# Patient Record
Sex: Female | Born: 2000
Health system: Southern US, Community
[De-identification: ages and names within clinical notes are randomized; demographics above are authoritative.]

## PROBLEM LIST (undated history)

## (undated) DIAGNOSIS — T7840XA Allergy, unspecified, initial encounter: Secondary | ICD-10-CM

## (undated) DIAGNOSIS — L309 Dermatitis, unspecified: Secondary | ICD-10-CM

## (undated) DIAGNOSIS — F32A Depression, unspecified: Secondary | ICD-10-CM

## (undated) DIAGNOSIS — F909 Attention-deficit hyperactivity disorder, unspecified type: Secondary | ICD-10-CM

## (undated) DIAGNOSIS — F419 Anxiety disorder, unspecified: Secondary | ICD-10-CM

## (undated) DIAGNOSIS — I7300 Raynaud's syndrome without gangrene: Secondary | ICD-10-CM

## (undated) DIAGNOSIS — R251 Tremor, unspecified: Secondary | ICD-10-CM

## (undated) DIAGNOSIS — F329 Major depressive disorder, single episode, unspecified: Secondary | ICD-10-CM

## (undated) HISTORY — PX: WISDOM TOOTH EXTRACTION: SHX21

## (undated) HISTORY — PX: NO PAST SURGERIES: SHX2092

## (undated) HISTORY — DX: Tremor, unspecified: R25.1

## (undated) HISTORY — DX: Allergy, unspecified, initial encounter: T78.40XA

## (undated) HISTORY — DX: Raynaud's syndrome without gangrene: I73.00

## (undated) HISTORY — DX: Dermatitis, unspecified: L30.9

## (undated) HISTORY — DX: Attention-deficit hyperactivity disorder, unspecified type: F90.9

---
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
## 2019-01-01 DIAGNOSIS — F411 Generalized anxiety disorder: Secondary | ICD-10-CM | POA: Diagnosis not present

## 2019-01-01 DIAGNOSIS — F902 Attention-deficit hyperactivity disorder, combined type: Secondary | ICD-10-CM | POA: Diagnosis not present

## 2019-01-01 DIAGNOSIS — F4312 Post-traumatic stress disorder, chronic: Secondary | ICD-10-CM | POA: Diagnosis not present

## 2019-01-01 DIAGNOSIS — F331 Major depressive disorder, recurrent, moderate: Secondary | ICD-10-CM | POA: Diagnosis not present

## 2019-01-08 DIAGNOSIS — F4312 Post-traumatic stress disorder, chronic: Secondary | ICD-10-CM | POA: Diagnosis not present

## 2019-01-08 DIAGNOSIS — F411 Generalized anxiety disorder: Secondary | ICD-10-CM | POA: Diagnosis not present

## 2019-01-08 DIAGNOSIS — F902 Attention-deficit hyperactivity disorder, combined type: Secondary | ICD-10-CM | POA: Diagnosis not present

## 2019-01-08 DIAGNOSIS — F331 Major depressive disorder, recurrent, moderate: Secondary | ICD-10-CM | POA: Diagnosis not present

## 2019-01-15 DIAGNOSIS — F411 Generalized anxiety disorder: Secondary | ICD-10-CM | POA: Diagnosis not present

## 2019-01-15 DIAGNOSIS — F902 Attention-deficit hyperactivity disorder, combined type: Secondary | ICD-10-CM | POA: Diagnosis not present

## 2019-01-15 DIAGNOSIS — F331 Major depressive disorder, recurrent, moderate: Secondary | ICD-10-CM | POA: Diagnosis not present

## 2019-01-15 DIAGNOSIS — F4312 Post-traumatic stress disorder, chronic: Secondary | ICD-10-CM | POA: Diagnosis not present

## 2019-01-22 DIAGNOSIS — F411 Generalized anxiety disorder: Secondary | ICD-10-CM | POA: Diagnosis not present

## 2019-01-22 DIAGNOSIS — F902 Attention-deficit hyperactivity disorder, combined type: Secondary | ICD-10-CM | POA: Diagnosis not present

## 2019-01-22 DIAGNOSIS — F4312 Post-traumatic stress disorder, chronic: Secondary | ICD-10-CM | POA: Diagnosis not present

## 2019-01-22 DIAGNOSIS — F331 Major depressive disorder, recurrent, moderate: Secondary | ICD-10-CM | POA: Diagnosis not present

## 2019-01-28 DIAGNOSIS — F902 Attention-deficit hyperactivity disorder, combined type: Secondary | ICD-10-CM | POA: Diagnosis not present

## 2019-01-28 DIAGNOSIS — F411 Generalized anxiety disorder: Secondary | ICD-10-CM | POA: Diagnosis not present

## 2019-01-28 DIAGNOSIS — F331 Major depressive disorder, recurrent, moderate: Secondary | ICD-10-CM | POA: Diagnosis not present

## 2019-01-28 DIAGNOSIS — F4312 Post-traumatic stress disorder, chronic: Secondary | ICD-10-CM | POA: Diagnosis not present

## 2019-02-07 DIAGNOSIS — F411 Generalized anxiety disorder: Secondary | ICD-10-CM | POA: Diagnosis not present

## 2019-02-07 DIAGNOSIS — F902 Attention-deficit hyperactivity disorder, combined type: Secondary | ICD-10-CM | POA: Diagnosis not present

## 2019-02-07 DIAGNOSIS — F4312 Post-traumatic stress disorder, chronic: Secondary | ICD-10-CM | POA: Diagnosis not present

## 2019-02-07 DIAGNOSIS — F331 Major depressive disorder, recurrent, moderate: Secondary | ICD-10-CM | POA: Diagnosis not present

## 2019-02-12 DIAGNOSIS — F331 Major depressive disorder, recurrent, moderate: Secondary | ICD-10-CM | POA: Diagnosis not present

## 2019-02-12 DIAGNOSIS — F902 Attention-deficit hyperactivity disorder, combined type: Secondary | ICD-10-CM | POA: Diagnosis not present

## 2019-02-12 DIAGNOSIS — F4312 Post-traumatic stress disorder, chronic: Secondary | ICD-10-CM | POA: Diagnosis not present

## 2019-02-12 DIAGNOSIS — F411 Generalized anxiety disorder: Secondary | ICD-10-CM | POA: Diagnosis not present

## 2019-02-19 DIAGNOSIS — F411 Generalized anxiety disorder: Secondary | ICD-10-CM | POA: Diagnosis not present

## 2019-02-19 DIAGNOSIS — F331 Major depressive disorder, recurrent, moderate: Secondary | ICD-10-CM | POA: Diagnosis not present

## 2019-02-19 DIAGNOSIS — F902 Attention-deficit hyperactivity disorder, combined type: Secondary | ICD-10-CM | POA: Diagnosis not present

## 2019-02-19 DIAGNOSIS — F4312 Post-traumatic stress disorder, chronic: Secondary | ICD-10-CM | POA: Diagnosis not present

## 2019-02-23 NOTE — Progress Notes (Signed)
Subjective:    Patient ID: Cassandra Schultz, female    DOB: 17-Jan-2001, 18 y.o.   MRN: [id number]  HPI:   Cassandra Schultz presents with chronic back pain, bil hip pain, bil knee pain, and bil ankle pain that started >3 years ago. She reports course of formal PT junior yr in HS She denies recent acute injury/trauma prior to onset of various pain sources. She is unable to rate pain, describes pain as "constant dull ache" that will worsen with repetitive movements. 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. She is FT Journalist, newspaper, living with friends parents as her relationship with her parents is toxic. She reports stable mood, denies SI/HI She denies tobacco/vape/ETOH/illicit drug use She reports that her mother has similar pain issues and is undergoing testing for RA  Patient Care Team    Relationship Specialty Notifications Start End  William Hamburger D, NP PCP - General Family Medicine  12/23/18   Maeola Harman, MD  Pediatrics  04/03/18    Comment: Rhea Pink, MD Consulting Physician Psychiatry  12/24/18     Patient Active Problem List   Diagnosis Date Noted  . Low back pain 02/24/2019  . Bilateral knee pain 02/24/2019  . Bilateral hip pain 02/24/2019  . Acute bilateral ankle pain 02/24/2019  . Healthcare maintenance 12/23/2018  . Generalized anxiety disorder 04/03/2018  . Attention deficit hyperactivity disorder (ADHD), combined type, severe 04/03/2018  . Mild recurrent major depression (HCC) 04/03/2018  . Developmental coordination disorder 04/03/2018  . Allergy with anaphylaxis due to food 02/02/2016  . Allergic rhinitis due to pollen 02/02/2016     Past Medical History:  Diagnosis Date  . ADHD   . Allergy   . Anxiety   . Depression    reports ADHD      Past Surgical History:  Procedure Laterality Date  . NO PAST SURGERIES       Family History  Problem Relation  Age of Onset  . Allergic rhinitis Mother   . Depression Mother   . Arthritis Mother   . Allergic rhinitis Father   . Allergic rhinitis Paternal Aunt   . Alcohol abuse Maternal Grandmother   . Depression Maternal Grandmother   . Alcohol abuse Maternal Grandfather   . Angioedema Neg Hx   . Asthma Neg Hx   . Eczema Neg Hx   . Immunodeficiency Neg Hx   . Urticaria Neg Hx      Social History   Substance and Sexual Activity  Drug Use Never     Social History   Substance and Sexual Activity  Alcohol Use Never  . Frequency: Never     Social History   Tobacco Use  Smoking Status Never Smoker  Smokeless Tobacco Never Used     Outpatient Encounter Medications as of 02/24/2019  Medication Sig  . ALPRAZolam (XANAX) 0.25 MG tablet Take 0.25 mg by mouth 2 (two) times daily as needed.  Marland Kitchen amphetamine-dextroamphetamine (ADDERALL) 30 MG tablet Take 1 tablet by mouth 2 (two) times daily with breakfast and lunch.  . EPINEPHrine 0.3 mg/0.3 mL IJ SOAJ injection INJECT 0.3 MLS (0.3 MG TOTAL) INTO THE MUSCLE ONCE.  Marland Kitchen FERROUS SULFATE PO Take 27 mg by mouth daily.  Marland Kitchen FLUoxetine (PROZAC) 20 MG capsule Take 1 capsule by mouth daily.  Marland Kitchen LARISSIA 0.1-20 MG-MCG tablet Take 1 tablet by mouth daily.  Marland Kitchen  Levocetirizine Dihydrochloride (XYZAL PO) Take 10 mg by mouth daily.  . [DISCONTINUED] amphetamine-dextroamphetamine (ADDERALL) 30 MG tablet Take 1 tablet by mouth 2 (two) times daily with breakfast and lunch.  . [DISCONTINUED] busPIRone (BUSPAR) 15 MG tablet Take 15 mg by mouth 2 (two) times daily.  . [DISCONTINUED] DULoxetine (CYMBALTA) 30 MG capsule Take 1 capsule (30 mg total) by mouth daily.   No facility-administered encounter medications on file as of 02/24/2019.     Allergies: Other, Peanut-containing drug products, Acyclovir and related, and Augmentin [amoxicillin-pot clavulanate]  Body mass index is 18.13 kg/m.  Blood pressure 96/63, pulse (!) 101, temperature 99.6 F (37.6 C),  temperature source Oral, height 5' 8.25" (1.734 m), weight 120 lb 1.6 oz (54.5 kg), last menstrual period 02/03/2019, SpO2 96 %.   Review of Systems  Constitutional: Positive for activity change and fatigue. Negative for appetite change, chills, diaphoresis, fever and unexpected weight change.  Eyes: Negative for visual disturbance.  Respiratory: Negative for cough, chest tightness, shortness of breath, wheezing and stridor.   Cardiovascular: Negative for chest pain, palpitations and leg swelling.  Endocrine: Negative for cold intolerance, heat intolerance, polydipsia, polyphagia and polyuria.  Musculoskeletal: Positive for arthralgias, back pain, gait problem, joint swelling, myalgias, neck pain and neck stiffness.  Neurological: Negative for dizziness and headaches.  Hematological: Negative for adenopathy. Does not bruise/bleed easily.  Psychiatric/Behavioral: Positive for decreased concentration, dysphoric mood and sleep disturbance. Negative for agitation, behavioral problems, confusion, hallucinations, self-injury and suicidal ideas. The patient is nervous/anxious and is hyperactive.        Objective:   Physical Exam Vitals signs and nursing note reviewed.  HENT:     Head: Normocephalic and atraumatic.  Cardiovascular:     Rate and Rhythm: Tachycardia present.     Pulses: Normal pulses.     Heart sounds: Normal heart sounds. No murmur. No friction rub. No gallop.   Pulmonary:     Effort: Pulmonary effort is normal. No respiratory distress.     Breath sounds: Normal breath sounds. No stridor. No wheezing, rhonchi or rales.  Chest:     Chest wall: No tenderness.  Musculoskeletal:        General: No tenderness.     Right hip: She exhibits normal range of motion, normal strength and no tenderness.     Left hip: She exhibits normal range of motion and normal strength.     Right knee: She exhibits normal range of motion and no swelling. No tenderness found.     Left knee: She  exhibits normal range of motion and no swelling. No tenderness found.     Right ankle: She exhibits normal range of motion. No tenderness.     Left ankle: She exhibits normal range of motion. No tenderness.     Cervical back: She exhibits no tenderness.     Thoracic back: She exhibits normal range of motion, no tenderness and no spasm.     Lumbar back: She exhibits normal range of motion, no tenderness and no spasm.     Comments: No tenderness with palpation, however pt appeared uncomfortable and constantly stretched during OV   Skin:    General: Skin is warm and dry.     Capillary Refill: Capillary refill takes less than 2 seconds.  Neurological:     Mental Status: She is alert and oriented to person, place, and time.     Coordination: Coordination normal.  Psychiatric:        Attention and Perception: Attention normal.  Mood and Affect: Mood is anxious.        Speech: Speech is rapid and pressured.        Behavior: Behavior normal.        Thought Content: Thought content normal.        Cognition and Memory: Cognition and memory normal.        Judgment: Judgment normal.           Assessment & Plan:   1. Need for influenza vaccination   2. Arthralgia, unspecified joint   3. Chronic bilateral low back pain without sciatica   4. Hip pain, bilateral   5. Bilateral chronic knee pain   6. Chronic pain of both ankles   7. Healthcare maintenance   8. Chronic pain of both knees   9. Bilateral hip pain   10. Acute bilateral ankle pain     Healthcare maintenance 1) Physical Therapy Referral placed, stretch daily as tolerated.Marland Kitchen 2) We will call you when lab results are available. 3) Recommend daily Boost Shakes to help increase daily caloric intake. 4) Continue with close follow-up with mental health care team.  Discuss Fibromyalgia with Psychiatrist. 5) Remain well hydrated with water. 6) Continue to abstain from tobacco/vape/alcohol use. 7) Follow-up here 3 months, sooner  if needed. 8) Continue social distance and to wear a mask in public.  Low back pain No tenderness with palpation, however pt appeared uncomfortable and constantly stretched during OV  Referral to PT placed Encouraged to stretch daily as tolerated Rheumatoid Factor drawn today   Bilateral knee pain No tenderness with palpation, however pt appeared uncomfortable and constantly stretched during OV  Referral to PT placed Encouraged to stretch daily as tolerated Rheumatoid Factor drawn today   Bilateral hip pain No tenderness with palpation, however pt appeared uncomfortable and constantly stretched during OV  Referral to PT placed Encouraged to stretch daily as tolerated Rheumatoid Factor drawn today   Acute bilateral ankle pain No tenderness with palpation, however pt appeared uncomfortable and constantly stretched during OV  Referral to PT placed Encouraged to stretch daily as tolerated Rheumatoid Factor drawn today     FOLLOW-UP:  Return in about 3 months (around 05/26/2019) for Regular Follow Up.

## 2019-02-24 ENCOUNTER — Other Ambulatory Visit: Payer: Self-pay

## 2019-02-24 ENCOUNTER — Ambulatory Visit (INDEPENDENT_AMBULATORY_CARE_PROVIDER_SITE_OTHER): Payer: BC Managed Care – PPO | Admitting: Adult Health

## 2019-02-24 ENCOUNTER — Encounter: Payer: Self-pay | Admitting: Adult Health

## 2019-02-24 VITALS — BP 96/63 | HR 101 | Temp 99.6°F | Ht 68.25 in | Wt 120.1 lb

## 2019-02-24 DIAGNOSIS — M545 Low back pain, unspecified: Secondary | ICD-10-CM | POA: Insufficient documentation

## 2019-02-24 DIAGNOSIS — Z23 Encounter for immunization: Secondary | ICD-10-CM

## 2019-02-24 DIAGNOSIS — M25562 Pain in left knee: Secondary | ICD-10-CM

## 2019-02-24 DIAGNOSIS — M25551 Pain in right hip: Secondary | ICD-10-CM

## 2019-02-24 DIAGNOSIS — M25561 Pain in right knee: Secondary | ICD-10-CM

## 2019-02-24 DIAGNOSIS — M25572 Pain in left ankle and joints of left foot: Secondary | ICD-10-CM

## 2019-02-24 DIAGNOSIS — M2550 Pain in unspecified joint: Secondary | ICD-10-CM | POA: Diagnosis not present

## 2019-02-24 DIAGNOSIS — G8929 Other chronic pain: Secondary | ICD-10-CM

## 2019-02-24 DIAGNOSIS — M25552 Pain in left hip: Secondary | ICD-10-CM

## 2019-02-24 DIAGNOSIS — Z0000 Encounter for general adult medical examination without abnormal findings: Secondary | ICD-10-CM

## 2019-02-24 DIAGNOSIS — M25571 Pain in right ankle and joints of right foot: Secondary | ICD-10-CM | POA: Insufficient documentation

## 2019-02-24 NOTE — Assessment & Plan Note (Addendum)
No tenderness with palpation, however pt appeared uncomfortable and constantly stretched during OV  Referral to PT placed Encouraged to stretch daily as tolerated Rheumatoid Factor drawn today  

## 2019-02-24 NOTE — Assessment & Plan Note (Signed)
No tenderness with palpation, however pt appeared uncomfortable and constantly stretched during OV  Referral to PT placed Encouraged to stretch daily as tolerated Rheumatoid Factor drawn today

## 2019-02-24 NOTE — Patient Instructions (Addendum)
Acute Back Pain, Adult Acute back pain is sudden and usually short-lived. It is often caused by an injury to the muscles and tissues in the back. The injury may result from:  A muscle or ligament getting overstretched or torn (strained). Ligaments are tissues that connect bones to each other. Lifting something improperly can cause a back strain.  Wear and tear (degeneration) of the spinal disks. Spinal disks are circular tissue that provides cushioning between the bones of the spine (vertebrae).  Twisting motions, such as while playing sports or doing yard work.  A hit to the back.  Arthritis. You may have a physical exam, lab tests, and imaging tests to find the cause of your pain. Acute back pain usually goes away with rest and home care. Follow these instructions at home: Managing pain, stiffness, and swelling  Take over-the-counter and prescription medicines only as told by your health care provider.  Your health care provider may recommend applying ice during the first 24-48 hours after your pain starts. To do this: ? Put ice in a plastic bag. ? Place a towel between your skin and the bag. ? Leave the ice on for 20 minutes, 2-3 times a day.  If directed, apply heat to the affected area as often as told by your health care provider. Use the heat source that your health care provider recommends, such as a moist heat pack or a heating pad. ? Place a towel between your skin and the heat source. ? Leave the heat on for 20-30 minutes. ? Remove the heat if your skin turns bright red. This is especially important if you are unable to feel pain, heat, or cold. You have a greater risk of getting burned. Activity   Do not stay in bed. Staying in bed for more than 1-2 days can delay your recovery.  Sit up and stand up straight. Avoid leaning forward when you sit, or hunching over when you stand. ? If you work at a desk, sit close to it so you do not need to lean over. Keep your chin tucked  in. Keep your neck drawn back, and keep your elbows bent at a right angle. Your arms should look like the letter "L." ? Sit high and close to the steering wheel when you drive. Add lower back (lumbar) support to your car seat, if needed.  Take short walks on even surfaces as soon as you are able. Try to increase the length of time you walk each day.  Do not sit, drive, or stand in one place for more than 30 minutes at a time. Sitting or standing for long periods of time can put stress on your back.  Do not drive or use heavy machinery while taking prescription pain medicine.  Use proper lifting techniques. When you bend and lift, use positions that put less stress on your back: ? Bend your knees. ? Keep the load close to your body. ? Avoid twisting.  Exercise regularly as told by your health care provider. Exercising helps your back heal faster and helps prevent back injuries by keeping muscles strong and flexible.  Work with a physical therapist to make a safe exercise program, as recommended by your health care provider. Do any exercises as told by your physical therapist. Lifestyle  Maintain a healthy weight. Extra weight puts stress on your back and makes it difficult to have good posture.  Avoid activities or situations that make you feel anxious or stressed. Stress and anxiety increase muscle   tension and can make back pain worse. Learn ways to manage anxiety and stress, such as through exercise. General instructions  Sleep on a firm mattress in a comfortable position. Try lying on your side with your knees slightly bent. If you lie on your back, put a pillow under your knees.  Follow your treatment plan as told by your health care provider. This may include: ? Cognitive or behavioral therapy. ? Acupuncture or massage therapy. ? Meditation or yoga. Contact a health care provider if:  You have pain that is not relieved with rest or medicine.  You have increasing pain going down  into your legs or buttocks.  Your pain does not improve after 2 weeks.  You have pain at night.  You lose weight without trying.  You have a fever or chills. Get help right away if:  You develop new bowel or bladder control problems.  You have unusual weakness or numbness in your arms or legs.  You develop nausea or vomiting.  You develop abdominal pain.  You feel faint. Summary  Acute back pain is sudden and usually short-lived.  Use proper lifting techniques. When you bend and lift, use positions that put less stress on your back.  Take over-the-counter and prescription medicines and apply heat or ice as directed by your health care provider. This information is not intended to replace advice given to you by your health care provider. Make sure you discuss any questions you have with your health care provider. Document Released: 06/04/2005 Document Revised: 09/23/2018 Document Reviewed: 01/16/2017 Elsevier Patient Education  2020 [employer].  1) Physical Therapy Referral placed, stretch daily as tolerated.Marland Kitchen 2) We will call you when lab results are available. 3) Recommend daily Boost Shakes to help increase daily caloric intake. 4) Continue with close follow-up with mental health care team.  Discuss Fibromyalgia with Psychiatrist. 5) Remain well hydrated with water. 6) Continue to abstain from tobacco/vape/alcohol use. 7) Follow-up here 3 months, sooner if needed. 8) Continue social distance and to wear a mask in public. FEEL BETTER!

## 2019-02-24 NOTE — Assessment & Plan Note (Signed)
No tenderness with palpation, however pt appeared uncomfortable and constantly stretched during OV  Referral to PT placed Encouraged to stretch daily as tolerated Rheumatoid Factor drawn today  

## 2019-02-24 NOTE — Assessment & Plan Note (Signed)
1) Physical Therapy Referral placed, stretch daily as tolerated.Marland Kitchen 2) We will call you when lab results are available. 3) Recommend daily Boost Shakes to help increase daily caloric intake. 4) Continue with close follow-up with mental health care team.  Discuss Fibromyalgia with Psychiatrist. 5) Remain well hydrated with water. 6) Continue to abstain from tobacco/vape/alcohol use. 7) Follow-up here 3 months, sooner if needed. 8) Continue social distance and to wear a mask in public.

## 2019-02-25 ENCOUNTER — Telehealth: Payer: Self-pay | Admitting: Adult Health

## 2019-02-25 LAB — RHEUMATOID FACTOR: Rheumatoid fact SerPl-aCnc: <10 IU/mL (ref 0.0–13.9)

## 2019-02-25 NOTE — Telephone Encounter (Signed)
See results notes.  T. Treston Coker, CMA 

## 2019-02-25 NOTE — Telephone Encounter (Signed)
Patient returned Cassandra Schultz's message to call office.  --glh

## 2019-02-26 DIAGNOSIS — F4312 Post-traumatic stress disorder, chronic: Secondary | ICD-10-CM | POA: Diagnosis not present

## 2019-02-26 DIAGNOSIS — F902 Attention-deficit hyperactivity disorder, combined type: Secondary | ICD-10-CM | POA: Diagnosis not present

## 2019-02-26 DIAGNOSIS — F411 Generalized anxiety disorder: Secondary | ICD-10-CM | POA: Diagnosis not present

## 2019-02-26 DIAGNOSIS — F331 Major depressive disorder, recurrent, moderate: Secondary | ICD-10-CM | POA: Diagnosis not present

## 2019-03-02 ENCOUNTER — Encounter: Payer: BC Managed Care – PPO | Admitting: Adult Health

## 2019-03-03 ENCOUNTER — Ambulatory Visit: Payer: BC Managed Care – PPO | Attending: Adult Health | Admitting: Physical Therapy

## 2019-03-03 ENCOUNTER — Other Ambulatory Visit: Payer: Self-pay

## 2019-03-03 ENCOUNTER — Encounter: Payer: Self-pay | Admitting: Physical Therapy

## 2019-03-03 DIAGNOSIS — M6281 Muscle weakness (generalized): Secondary | ICD-10-CM | POA: Diagnosis not present

## 2019-03-03 DIAGNOSIS — G8929 Other chronic pain: Secondary | ICD-10-CM | POA: Diagnosis not present

## 2019-03-03 DIAGNOSIS — M545 Low back pain, unspecified: Secondary | ICD-10-CM

## 2019-03-03 DIAGNOSIS — M2550 Pain in unspecified joint: Secondary | ICD-10-CM | POA: Diagnosis not present

## 2019-03-04 NOTE — Therapy (Signed)
[place], [place], [address] Phone: [phone]   Fax:  [phone]  Physical Therapy Evaluation  Patient Details  Name: Cassandra Schultz MRN: [id number] Date of Birth: 2000/11/13 Referring Provider (PT): Mina Marble , NP    Encounter Date: 03/03/2019  PT End of Session - 03/04/19 5809    Visit Number  1    Number of Visits  12    Date for PT Re-Evaluation  04/28/19   allow 8 weeks for scheduling   PT Start Time  9833    PT Stop Time  1746    PT Time Calculation (min)  48 min    Activity Tolerance  Patient tolerated treatment well    Behavior During Therapy  [facility] for tasks assessed/performed;Restless       Past Medical History:  Diagnosis Date  . ADHD   . Allergy   . Anxiety   . Depression    reports ADHD     Past Surgical History:  Procedure Laterality Date  . NO PAST SURGERIES      There were no vitals filed for this visit.   Subjective Assessment - 03/03/19 1700    Subjective  Patient presents with back, knees, hips, and ankles.  The hip and ankle pain is new to her, she has had back pain in the past.  She has been working 2 jobs for months and that was challenging for her.  Now its only a part time job so she is hopeful that may help things. She has in the past been able to control her pain with exercise. She is having a hard time gaining weight. Enjoys exercise but now it seems to make her hurt more.    Pertinent History  anxiety, depression    Limitations  Standing;Walking;Lifting;Sitting    How long can you sit comfortably?  1 hour    How long can you stand comfortably?  needs to sit at 1-2 hours    How long can you walk comfortably?  can do an hour, not as bad as standing still    Diagnostic tests  Blood work, [facility] neg    Patient Stated Goals  Patient would like to learn exercise to protect my joints and muscles    Currently in Pain?  Yes    Pain Score  1     Pain Location  Back    Pain Orientation  Lower    Pain Descriptors / Indicators  Aching    Pain Type  Chronic pain    Pain Onset  More than a month ago    Aggravating Factors   standing, sitting long periods, incr as the day goes on    Pain Relieving Factors  rest, folding forward (spine flexion), stretching    Effect of Pain on Daily Activities  hard to work    Multiple Pain Sites  Yes    Pain Score  0    Pain Location  Generalized   hips, knees and ankles not hurting at the time        [facility] PT Assessment - 03/04/19 0001      Assessment   Medical Diagnosis  low back pain, hip, knee and ankle pain     Referring Provider (PT)  Mina Marble , NP     Onset Date/Surgical Date  --   chronic    Hand Dominance  Right    Prior Therapy  Yes       Precautions  Precautions  None      Restrictions   Weight Bearing Restrictions  No      Balance Screen   Has the patient fallen in the past 6 months  No   balance is "not great"     Home Environment   Living Environment  Private residence    Living Arrangements  Non-relatives/Friends    Type of Home  House    Home Layout  Two level    Additional Comments  currently living with her friend's mom       Prior Function   Level of Independence  Independent    Vocation  Part time employment;Student    Vocation Requirements  standing long periods, cases of produce intermittently     Leisure  used to do school, running, exercise, art , friends       Cognition   Overall Cognitive Status  Within Functional Limits for tasks assessed    Behaviors  Restless      Observation/Other Assessments   Focus on Therapeutic Outcomes (FOTO)   NT did not capture       Sensation   Light Touch  Appears Intact      Posture/Postural Control   Posture/Postural Control  Postural limitations    Postural Limitations  Rounded Shoulders;Forward head    Posture Comments  small frame, slumped sitting       AROM   Lumbar Flexion  WFL    Lumbar Extension  limited 10-15% min pain      Lumbar - Right Side Bend  WFL    Lumbar - Left Side Bend  WFL    Lumbar - Right Rotation  WFL     Lumbar - Left Rotation  WFL       PROM   Overall PROM Comments  hips, knees WFL      Strength   Overall Strength Comments  WFL in knees, ankles    core fair    Right Hip ABduction  4/5    Right Hip ADduction  3+/5    Left Hip ABduction  4-/5    Left Hip ADduction  3+/5    Lumbar Flexion  --   fair      Flexibility   Hamstrings  min tightness    ITB  min tightness       Palpation   Patella mobility  WNL     Palpation comment  tender with palpation to infrapatellar fat pad , none along joint lines, tender Gr. Trochanter bilateral. Min tender in bilateral Quadratus lumborum      Special Tests    Special Tests  Knee Special Tests    Lumbar Tests  Straight Leg Raise    Hip Special Tests   Trendelenberg Test    Knee Special tests   other      Straight Leg Raise   Findings  Negative      Trendelenburg Test   Findings  Negative      other    Findings  Negative    Comments  stability testing neg        Objective measurements completed on examination: See above findings.      PT Education - 03/04/19 [phone]    Education Details  HEP, PT/POC, joint pain, recommendations for exercise, footwear    Person(s) Educated  Patient    Methods  Explanation;Demonstration;Handout;Verbal cues    Comprehension  Verbalized understanding;Returned demonstration          PT Long Term Goals -  03/03/19 1735      PT LONG TERM GOAL #1   Title  She will be able to demo final HEP with good technique    Time  8    Period  Weeks    Status  New    Target Date  04/28/19      PT LONG TERM GOAL #2   Title  Pt will be able to stand 4-5 hours with pain relieved with rest in order to perform work duties    Time  8    Period  Weeks    Status  New    Target Date  04/28/19      PT LONG TERM GOAL #3   Title  Pt will understand posture, safe lifting as it pertains to back pain and joint  protection.    Time  8    Period  Weeks    Status  New    Target Date  04/28/19      PT LONG TERM GOAL #4   Title  Pt will be able to hold plank position for 30 sec with no pain in back, good form to demo increased core strength    Time  8    Period  Weeks    Status  New    Target Date  04/28/19      PT LONG TERM GOAL #5   Title  Pt wil be able to exercise at home 30 min x 3 days per week without increased pain following in joints (muscular soreness OK)    Time  8    Period  Weeks    Status  New    Target Date  04/28/19             Plan - 03/04/19 [id number]    Clinical Impression Statement  Patient presents for mod complexity eval of back and lower extremity joint pain.  The hip, knee and ankle pain is new to patient from previous PT episode , began about 6 mos ago. She has min weakness in core and hips, generally decreased muscular endurance, no signs of joint instability.  She hopes to be able to get back into an exercise routine. 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  She should do well with PT intervention as it did help her in the past (2017)    Personal Factors and Comorbidities  Comorbidity 1;Other;Social Background    Comorbidities  anxiety, depression    Examination-Activity Limitations  Sit;Stand    Examination-Participation Restrictions  Community Activity;Other   work   Conservation officer, historic buildingstability/Clinical Decision Making  Evolving/Moderate complexity    Clinical Decision Making  Moderate    Rehab Potential  Excellent    PT Frequency  2x / week    PT Duration  6 weeks   8 weeks for scheduling   PT Treatment/Interventions  ADLs/Self Care Home Management;Patient/family education;Moist Heat;Therapeutic activities;Ultrasound;Therapeutic exercise;Taping;Neuromuscular re-education;Electrical Stimulation;Cryotherapy;Manual techniques    PT Next Visit Plan  check HEP, bike, progress core and hip strength, look at ankle a bit more (ROM, strength) time limited on eval    PT  Home Exercise Plan  ITB stretch, knee to chest, hip abd and bridge    Consulted and Agree with Plan of Care  Patient       Patient will benefit from skilled therapeutic intervention in order to improve the following deficits and impairments:  Difficulty walking, Pain, Impaired flexibility, Postural dysfunction, Decreased strength, Decreased mobility  Visit Diagnosis: Muscle weakness (generalized)  Chronic bilateral low back pain without sciatica  Generalized joint pain     Problem List Patient Active Problem List   Diagnosis Date Noted  . Low back pain 02/24/2019  . Bilateral knee pain 02/24/2019  . Bilateral hip pain 02/24/2019  . Acute bilateral ankle pain 02/24/2019  . Healthcare maintenance 12/23/2018  . Generalized anxiety disorder 04/03/2018  . Attention deficit hyperactivity disorder (ADHD), combined type, severe 04/03/2018  . Mild recurrent major depression (HCC) 04/03/2018  . Developmental coordination disorder 04/03/2018  . Allergy with anaphylaxis due to food 02/02/2016  . Allergic rhinitis due to pollen 02/02/2016    , 03/04/2019, 12:22 PM  [facility] [address] [place], [place], [address] Phone: [phone]   Fax:  [phone]  Name: Reneisha Knibbs MRN: [id number] Date of Birth: 2000-11-10  [name], PT 03/04/19 12:22 PM Phone: [phone] Fax: [phone]

## 2019-03-05 DIAGNOSIS — F902 Attention-deficit hyperactivity disorder, combined type: Secondary | ICD-10-CM | POA: Diagnosis not present

## 2019-03-05 DIAGNOSIS — F331 Major depressive disorder, recurrent, moderate: Secondary | ICD-10-CM | POA: Diagnosis not present

## 2019-03-05 DIAGNOSIS — F411 Generalized anxiety disorder: Secondary | ICD-10-CM | POA: Diagnosis not present

## 2019-03-05 DIAGNOSIS — F4312 Post-traumatic stress disorder, chronic: Secondary | ICD-10-CM | POA: Diagnosis not present

## 2019-03-12 DIAGNOSIS — F902 Attention-deficit hyperactivity disorder, combined type: Secondary | ICD-10-CM | POA: Diagnosis not present

## 2019-03-12 DIAGNOSIS — F411 Generalized anxiety disorder: Secondary | ICD-10-CM | POA: Diagnosis not present

## 2019-03-12 DIAGNOSIS — F4312 Post-traumatic stress disorder, chronic: Secondary | ICD-10-CM | POA: Diagnosis not present

## 2019-03-12 DIAGNOSIS — F331 Major depressive disorder, recurrent, moderate: Secondary | ICD-10-CM | POA: Diagnosis not present

## 2019-03-14 DIAGNOSIS — B373 Candidiasis of vulva and vagina: Secondary | ICD-10-CM | POA: Diagnosis not present

## 2019-03-16 ENCOUNTER — Encounter: Payer: Self-pay | Admitting: Physical Therapy

## 2019-03-16 ENCOUNTER — Other Ambulatory Visit: Payer: Self-pay

## 2019-03-16 ENCOUNTER — Ambulatory Visit: Payer: BC Managed Care – PPO | Admitting: Physical Therapy

## 2019-03-16 DIAGNOSIS — M6281 Muscle weakness (generalized): Secondary | ICD-10-CM

## 2019-03-16 DIAGNOSIS — G8929 Other chronic pain: Secondary | ICD-10-CM

## 2019-03-16 DIAGNOSIS — M2550 Pain in unspecified joint: Secondary | ICD-10-CM

## 2019-03-16 DIAGNOSIS — M545 Low back pain: Secondary | ICD-10-CM | POA: Diagnosis not present

## 2019-03-16 NOTE — Therapy (Signed)
[facility] [place], [place], [address] Phone: [phone]   Fax:  [phone]  Physical Therapy Treatment  Patient Details  Name: Cassandra Schultz MRN: [id number] Date of Birth: 2001/01/19 Referring Provider (PT): Mina Marble , NP    Encounter Date: 03/16/2019  PT End of Session - 03/16/19 1812    Visit Number  2    Number of Visits  12    Date for PT Re-Evaluation  04/28/19    PT Start Time  1616    PT Stop Time  1657    PT Time Calculation (min)  41 min    Activity Tolerance  Patient limited by fatigue    Behavior During Therapy  [facility] for tasks assessed/performed;Restless       Past Medical History:  Diagnosis Date  . ADHD   . Allergy   . Anxiety   . Depression    reports ADHD     Past Surgical History:  Procedure Laterality Date  . NO PAST SURGERIES      There were no vitals filed for this visit.  Subjective Assessment - 03/16/19 1622    Subjective  Knees and ankles don't really hurt anymore, stiff.  No hip or back pain right now.         [place] [facility] PT Treatment/Exercise - 03/16/19 0001      Lumbar Exercises: Standing   Heel Raises  10 reps    Heel Raises Limitations  10 lbs     Functional Squats  10 reps    Lifting Weights (lbs)  10    Lifting Limitations  dead lift x 10       Knee/Hip Exercises: Stretches   Active Hamstring Stretch  Both;2 reps    Active Hamstring Stretch Limitations  3 way hip : hamstring, ITB and adductors       Knee/Hip Exercises: Aerobic   Stationary Bike  5 min L 1       Knee/Hip Exercises: Supine   Bridges  Strengthening;Both;2 sets    Bridges Limitations  used band around thighs     Other Supine Knee/Hip Exercises  clam blue x 10       Knee/Hip Exercises: Sidelying   Hip ABduction  Strengthening;Both;1 set;15 reps    Clams  x 15 each                   PT Long Term Goals - 03/03/19 1735      PT LONG TERM GOAL #1   Title  She will be  able to demo final HEP with good technique    Time  8    Period  Weeks    Status  New    Target Date  04/28/19      PT LONG TERM GOAL #2   Title  Pt will be able to stand 4-5 hours with pain relieved with rest in order to perform work duties    Time  8    Period  Weeks    Status  New    Target Date  04/28/19      PT LONG TERM GOAL #3   Title  Pt will understand posture, safe lifting as it pertains to back pain and joint protection.    Time  8    Period  Weeks    Status  New    Target Date  04/28/19      PT LONG TERM GOAL #4   Title  Pt  will be able to hold plank position for 30 sec with no pain in back, good form to demo increased core strength    Time  8    Period  Weeks    Status  New    Target Date  04/28/19      PT LONG TERM GOAL #5   Title  Pt wil be able to exercise at home 30 min x 3 days per week without increased pain following in joints (muscular soreness OK)    Time  8    Period  Weeks    Status  New    Target Date  04/28/19            Plan - 03/16/19 1622    Clinical Impression Statement  Pt with increased fatigue  needed cues to stay on task.  She had good form with squats and deadlifts.  Not limited by pain during exercise but she had not been standing or doing much prior to PT.    PT Treatment/Interventions  ADLs/Self Care Home Management;Patient/family education;Moist Heat;Therapeutic activities;Ultrasound;Therapeutic exercise;Taping;Neuromuscular re-education;Electrical Stimulation;Cryotherapy;Manual techniques    PT Next Visit Plan  check HEP, bike, progress core and hip strength, may look at ankle a bit more (ROM, strength) time limited on eval    PT Home Exercise Plan  ITB stretch, knee to chest, hip abd and bridge with blue band    Consulted and Agree with Plan of Care  Patient       Patient will benefit from skilled therapeutic intervention in order to improve the following deficits and impairments:  Difficulty walking, Pain, Impaired  flexibility, Postural dysfunction, Decreased strength, Decreased mobility  Visit Diagnosis: Muscle weakness (generalized)  Chronic bilateral low back pain without sciatica  Generalized joint pain     Problem List Patient Active Problem List   Diagnosis Date Noted  . Low back pain 02/24/2019  . Bilateral knee pain 02/24/2019  . Bilateral hip pain 02/24/2019  . Acute bilateral ankle pain 02/24/2019  . Healthcare maintenance 12/23/2018  . Generalized anxiety disorder 04/03/2018  . Attention deficit hyperactivity disorder (ADHD), combined type, severe 04/03/2018  . Mild recurrent major depression (HCC) 04/03/2018  . Developmental coordination disorder 04/03/2018  . Allergy with anaphylaxis due to food 02/02/2016  . Allergic rhinitis due to pollen 02/02/2016    [name] 03/16/2019, 6:12 PM  [facility] [address] [place], [place], [address] Phone: [phone]   Fax:  [phone]  Name: Cassandra Schultz MRN: [id number] Date of Birth: 03/03/2001  [name], PT 03/16/19 6:13 PM Phone: [phone] Fax: [phone]

## 2019-03-18 ENCOUNTER — Other Ambulatory Visit: Payer: Self-pay

## 2019-03-18 ENCOUNTER — Encounter: Payer: Self-pay | Admitting: Physical Therapy

## 2019-03-18 ENCOUNTER — Telehealth: Payer: Self-pay | Admitting: Adult Health

## 2019-03-18 ENCOUNTER — Ambulatory Visit: Payer: BC Managed Care – PPO | Admitting: Physical Therapy

## 2019-03-18 DIAGNOSIS — G8929 Other chronic pain: Secondary | ICD-10-CM

## 2019-03-18 DIAGNOSIS — M2550 Pain in unspecified joint: Secondary | ICD-10-CM

## 2019-03-18 DIAGNOSIS — E6609 Other obesity due to excess calories: Secondary | ICD-10-CM

## 2019-03-18 DIAGNOSIS — M545 Low back pain: Secondary | ICD-10-CM | POA: Diagnosis not present

## 2019-03-18 DIAGNOSIS — M6281 Muscle weakness (generalized): Secondary | ICD-10-CM

## 2019-03-18 NOTE — Telephone Encounter (Signed)
Patient called states she was advised to contact her PCP for a referral to Nutritional management  ( [place] preferably)   ---Forwarding request to medical asst.  --glh

## 2019-03-18 NOTE — Therapy (Signed)
[facility] Outpatient Rehabilitation [facility] [address] [place], [place], [address] Phone: [phone]   Fax:  [phone]  Physical Therapy Treatment  Patient Details  Name: Cassandra Schultz MRN: [id number] Date of Birth: Dec 13, 2000 Referring Provider (PT): William Hamburger , NP    Encounter Date: 03/18/2019  PT End of Session - 03/18/19 1454    Visit Number  3    Number of Visits  12    Date for PT Re-Evaluation  04/28/19    PT Start Time  1415    PT Stop Time  1454    PT Time Calculation (min)  39 min    Activity Tolerance  Patient tolerated treatment well    Behavior During Therapy  [facility] for tasks assessed/performed       Past Medical History:  Diagnosis Date  . ADHD   . Allergy   . Anxiety   . Depression    reports ADHD     Past Surgical History:  Procedure Laterality Date  . NO PAST SURGERIES      There were no vitals filed for this visit.  Subjective Assessment - 03/18/19 1420    Subjective  My neck hurts and knees are sore. I slept wierd last night and didn't move much.    Patient Stated Goals  Patient would like to learn exercise to protect my joints and muscles    Currently in Pain?  No/denies    Pain Score  0                       [facility] Adult PT Treatment/Exercise - 03/18/19 0001      Lumbar Exercises: Aerobic   Stationary Bike  5 min L1 4 min L7 1 min      Lumbar Exercises: Supine   Other Supine Lumbar Exercises  foam roller: chest stretch, horiz abd, marching    Other Supine Lumbar Exercises  TT to leg extension in supine      Knee/Hip Exercises: Standing   SLS  on green therapad with knee drives & hip ext without touching floor    Other Standing Knee Exercises  squat pull off FM 10x10s             PT Education - 03/18/19 1448    Education Details  posture brace, nutrition counseling, shoes & what to look for    Person(s) Educated  Patient    Methods  Explanation    Comprehension  Verbalized  understanding          PT Long Term Goals - 03/03/19 1735      PT LONG TERM GOAL #1   Title  She will be able to demo final HEP with good technique    Time  8    Period  Weeks    Status  New    [place] Date  04/28/19      PT LONG TERM GOAL #2   Title  Pt will be able to stand 4-5 hours with pain relieved with rest in order to perform work duties    Time  8    Period  Weeks    Status  New    Target Date  04/28/19      PT LONG TERM GOAL #3   Title  Pt will understand posture, safe lifting as it pertains to back pain and joint protection.    Time  8    Period  Weeks    Status  New  Target Date  04/28/19      PT LONG TERM GOAL #4   Title  Pt will be able to hold plank position for 30 sec with no pain in back, good form to demo increased core strength    Time  8    Period  Weeks    Status  New    Target Date  04/28/19      PT LONG TERM GOAL #5   Title  Pt wil be able to exercise at home 30 min x 3 days per week without increased pain following in joints (muscular soreness OK)    Time  8    Period  Weeks    Status  New    Target Date  04/28/19            Plan - 03/18/19 1455    Clinical Impression Statement  Pt demo significant difficulty controlling balance on foam roller due to lack of strength. Pt does not have a very balanced diet and is living with a friend's mom at this time. I encouraged her to contact her PCP for referral to nutrition counseling and wrote down the info for their office. Is beginning work soon at [employer]. Will continue to benefit from gross strength and stability training to decrease pain.    PT Treatment/Interventions  ADLs/Self Care Home Management;Patient/family education;Moist Heat;Therapeutic activities;Ultrasound;Therapeutic exercise;Taping;Neuromuscular re-education;Electrical Stimulation;Cryotherapy;Manual techniques    PT Next Visit Plan  [facility] strengthening. core stability    PT Home Exercise Plan  ITB stretch, knee to chest, hip  abd and bridge with blue band    Consulted and Agree with Plan of Care  Patient       Patient will benefit from skilled therapeutic intervention in order to improve the following deficits and impairments:  Difficulty walking, Pain, Impaired flexibility, Postural dysfunction, Decreased strength, Decreased mobility  Visit Diagnosis: Muscle weakness (generalized)  Chronic bilateral low back pain without sciatica  Generalized joint pain     Problem List Patient Active Problem List   Diagnosis Date Noted  . Low back pain 02/24/2019  . Bilateral knee pain 02/24/2019  . Bilateral hip pain 02/24/2019  . Acute bilateral ankle pain 02/24/2019  . Healthcare maintenance 12/23/2018  . Generalized anxiety disorder 04/03/2018  . Attention deficit hyperactivity disorder (ADHD), combined type, severe 04/03/2018  . Mild recurrent major depression ([place]) 04/03/2018  . Developmental coordination disorder 04/03/2018  . Allergy with anaphylaxis due to food 02/02/2016  . Allergic rhinitis due to pollen 02/02/2016    Cassandra Schultz C. Haytham Maher PT, DPT 03/18/19 2:58 PM   [facility] [address] [place], [place], [address] Phone: [phone]   Fax:  [phone]  Name: Cassandra Schultz MRN: [id number] Date of Birth: 2001-05-01

## 2019-03-19 DIAGNOSIS — F902 Attention-deficit hyperactivity disorder, combined type: Secondary | ICD-10-CM | POA: Diagnosis not present

## 2019-03-19 DIAGNOSIS — F411 Generalized anxiety disorder: Secondary | ICD-10-CM | POA: Diagnosis not present

## 2019-03-19 DIAGNOSIS — F331 Major depressive disorder, recurrent, moderate: Secondary | ICD-10-CM | POA: Diagnosis not present

## 2019-03-19 DIAGNOSIS — F4312 Post-traumatic stress disorder, chronic: Secondary | ICD-10-CM | POA: Diagnosis not present

## 2019-03-23 ENCOUNTER — Encounter: Payer: Self-pay | Admitting: Adult Health

## 2019-03-23 ENCOUNTER — Ambulatory Visit: Payer: BC Managed Care – PPO | Attending: Adult Health | Admitting: Physical Therapy

## 2019-03-23 DIAGNOSIS — M545 Low back pain: Secondary | ICD-10-CM | POA: Insufficient documentation

## 2019-03-23 DIAGNOSIS — M2550 Pain in unspecified joint: Secondary | ICD-10-CM | POA: Insufficient documentation

## 2019-03-23 DIAGNOSIS — M6281 Muscle weakness (generalized): Secondary | ICD-10-CM | POA: Insufficient documentation

## 2019-03-23 DIAGNOSIS — G8929 Other chronic pain: Secondary | ICD-10-CM | POA: Insufficient documentation

## 2019-03-23 NOTE — Telephone Encounter (Signed)
Called patient regarding her missed appt.  She forgot about it but plans to be here for her next appt Wed. Oct. 7 at 2:15.  Raeford Razor, PT 03/23/19 1:37 PM Phone: [phone] Fax: [phone]

## 2019-03-25 ENCOUNTER — Other Ambulatory Visit: Payer: Self-pay

## 2019-03-25 ENCOUNTER — Ambulatory Visit: Payer: BC Managed Care – PPO | Admitting: Physical Therapy

## 2019-03-25 ENCOUNTER — Encounter: Payer: Self-pay | Admitting: Physical Therapy

## 2019-03-25 DIAGNOSIS — M2550 Pain in unspecified joint: Secondary | ICD-10-CM | POA: Diagnosis not present

## 2019-03-25 DIAGNOSIS — M6281 Muscle weakness (generalized): Secondary | ICD-10-CM | POA: Diagnosis not present

## 2019-03-25 DIAGNOSIS — G8929 Other chronic pain: Secondary | ICD-10-CM

## 2019-03-25 DIAGNOSIS — M545 Low back pain: Secondary | ICD-10-CM | POA: Diagnosis not present

## 2019-03-25 NOTE — Therapy (Signed)
[place], [place], [address] Phone: [phone]   Fax:  [phone]  Physical Therapy Treatment  Patient Details  Name: Cassandra Schultz MRN: [id number] Date of Birth: 2001/03/23 Referring Provider (PT): Mina Marble , NP    Encounter Date: 03/25/2019  PT End of Session - 03/25/19 1420    Visit Number  4    Number of Visits  12    Date for PT Re-Evaluation  04/28/19    PT Start Time  2119    PT Stop Time  1455    PT Time Calculation (min)  40 min    Activity Tolerance  Patient tolerated treatment well    Behavior During Therapy  [facility] for tasks assessed/performed       Past Medical History:  Diagnosis Date  . ADHD   . Allergy   . Anxiety   . Depression    reports ADHD     Past Surgical History:  Procedure Laterality Date  . NO PAST SURGERIES      There were no vitals filed for this visit.  Subjective Assessment - 03/25/19 1419    Subjective  My neck hurts, I think I was holding it funny.    Patient Stated Goals  Patient would like to learn exercise to protect my joints and muscles    Currently in Pain?  Yes    Pain Score  1     Pain Location  Neck    Pain Descriptors / Indicators  Sore                       [facility] Adult PT Treatment/Exercise - 03/25/19 0001      Lumbar Exercises: Standing   Wall Slides Limitations  wall sit with 1 min hold    Other Standing Lumbar Exercises  squats: neutral & in ER    Other Standing Lumbar Exercises  lunges: fwd & side      Lumbar Exercises: Supine   Other Supine Lumbar Exercises  lower curl ball bw knees      Lumbar Exercises: Sidelying   Other Sidelying Lumbar Exercises  side planks with hip lifts      Lumbar Exercises: Prone   Other Prone Lumbar Exercises  standard plank      Lumbar Exercises: Quadruped   Other Quadruped Lumbar Exercises  lawn mower red tband      Manual Therapy   Manual therapy comments  STM bil cervical  paraspinals & upper traps             PT Education - 03/25/19 1458    Education Details  working exercises into her day, shoes, exercise form/rationale    Person(s) Educated  Patient    Methods  Explanation;Demonstration;Tactile cues;Verbal cues;Handout    Comprehension  Verbalized understanding;Returned demonstration;Verbal cues required;Tactile cues required;Need further instruction          PT Long Term Goals - 03/03/19 1735      PT LONG TERM GOAL #1   Title  She will be able to demo final HEP with good technique    Time  8    Period  Weeks    Status  New    Target Date  04/28/19      PT LONG TERM GOAL #2   Title  Pt will be able to stand 4-5 hours with pain relieved with rest in order to perform work duties    Time  8  Period  Weeks    Status  New    Target Date  04/28/19      PT LONG TERM GOAL #3   Title  Pt will understand posture, safe lifting as it pertains to back pain and joint protection.    Time  8    Period  Weeks    Status  New    Target Date  04/28/19      PT LONG TERM GOAL #4   Title  Pt will be able to hold plank position for 30 sec with no pain in back, good form to demo increased core strength    Time  8    Period  Weeks    Status  New    Target Date  04/28/19      PT LONG TERM GOAL #5   Title  Pt wil be able to exercise at home 30 min x 3 days per week without increased pain following in joints (muscular soreness OK)    Time  8    Period  Weeks    Status  New    Target Date  04/28/19            Plan - 03/25/19 1500    Clinical Impression Statement  Added challenges in planks, squats and lunges to HEP for gross strengthening and stability. Denied increase in pain with exercises. Decreased neck pain following manual therapy.    PT Treatment/Interventions  ADLs/Self Care Home Management;Patient/family education;Moist Heat;Therapeutic activities;Ultrasound;Therapeutic exercise;Taping;Neuromuscular re-education;Electrical  Stimulation;Cryotherapy;Manual techniques    PT Next Visit Plan  continue with gross strengthening- upper body    PT Home Exercise Plan  ITB stretch, knee to chest, hip abd and bridge with blue band, lunges, squats, planks, qped lawn mower    Consulted and Agree with Plan of Care  Patient       Patient will benefit from skilled therapeutic intervention in order to improve the following deficits and impairments:  Difficulty walking, Pain, Impaired flexibility, Postural dysfunction, Decreased strength, Decreased mobility  Visit Diagnosis: Muscle weakness (generalized)  Chronic bilateral low back pain without sciatica  Generalized joint pain     Problem List Patient Active Problem List   Diagnosis Date Noted  . Low back pain 02/24/2019  . Bilateral knee pain 02/24/2019  . Bilateral hip pain 02/24/2019  . Acute bilateral ankle pain 02/24/2019  . Healthcare maintenance 12/23/2018  . Generalized anxiety disorder 04/03/2018  . Attention deficit hyperactivity disorder (ADHD), combined type, severe 04/03/2018  . Mild recurrent major depression (H[facility]) 04/03/2018  . Developmental coordination disorder 04/03/2018  . Allergy with anaphylaxis due to food 02/02/2016  . Allergic rhinitis due to pollen 02/02/2016    Cassandra Schultz C. Royce Stegman PT, DPT 03/25/19 3:03 PM   [facility] Outpatient Rehabilitation [facility] [address] [place], [place], [address] Phone: [phone]   Fax:  [phone]  Name: Cassandra Schultz MRN: [id number] Date of Birth: [birth year]

## 2019-03-26 DIAGNOSIS — F902 Attention-deficit hyperactivity disorder, combined type: Secondary | ICD-10-CM | POA: Diagnosis not present

## 2019-03-26 DIAGNOSIS — F411 Generalized anxiety disorder: Secondary | ICD-10-CM | POA: Diagnosis not present

## 2019-03-26 DIAGNOSIS — F4312 Post-traumatic stress disorder, chronic: Secondary | ICD-10-CM | POA: Diagnosis not present

## 2019-03-26 DIAGNOSIS — F331 Major depressive disorder, recurrent, moderate: Secondary | ICD-10-CM | POA: Diagnosis not present

## 2019-03-30 ENCOUNTER — Encounter: Payer: Self-pay | Admitting: Physical Therapy

## 2019-03-30 ENCOUNTER — Ambulatory Visit: Payer: BC Managed Care – PPO | Admitting: Physical Therapy

## 2019-03-30 ENCOUNTER — Encounter (INDEPENDENT_AMBULATORY_CARE_PROVIDER_SITE_OTHER): Payer: Self-pay

## 2019-03-30 ENCOUNTER — Other Ambulatory Visit: Payer: Self-pay

## 2019-03-30 DIAGNOSIS — M6281 Muscle weakness (generalized): Secondary | ICD-10-CM

## 2019-03-30 DIAGNOSIS — G8929 Other chronic pain: Secondary | ICD-10-CM | POA: Diagnosis not present

## 2019-03-30 DIAGNOSIS — M545 Low back pain, unspecified: Secondary | ICD-10-CM

## 2019-03-30 DIAGNOSIS — M2550 Pain in unspecified joint: Secondary | ICD-10-CM

## 2019-03-30 NOTE — Therapy (Signed)
[facility] Outpatient Rehabilitation [facility] [address] [place], [place], [address] Phone: [phone]   Fax:  [phone]  Physical Therapy Treatment  Patient Details  Name: Cassandra Schultz MRN: [id number] Date of Birth: 09-12-2000 Referring Provider (PT): William Hamburger , NP    Encounter Date: 03/30/2019  PT End of Session - 03/30/19 1238    Visit Number  5    Number of Visits  12    Date for PT Re-Evaluation  04/28/19    PT Start Time  1234    PT Stop Time  1315    PT Time Calculation (min)  41 min    Activity Tolerance  Patient tolerated treatment well    Behavior During Therapy  [facility] for tasks assessed/performed       Past Medical History:  Diagnosis Date  . ADHD   . Allergy   . Anxiety   . Depression    reports ADHD     Past Surgical History:  Procedure Laterality Date  . NO PAST SURGERIES      There were no vitals filed for this visit.  Subjective Assessment - 03/30/19 1233    Subjective  PT is helping me feel not so tired at my work.  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  Yes    Pain Score  1     Pain Location  Leg    Pain Orientation  Right    Pain Descriptors / Indicators  Sharp    Pain Type  Chronic pain    Pain Score  2    Pain Location  Neck    Pain Orientation  Posterior    Pain Descriptors / Indicators  Sore    Pain Type  Chronic pain    Pain Onset  In the past 7 days    Pain Frequency  Occasional    Aggravating Factors   looking down alot    Pain Relieving Factors  not working        [facility] Adult PT Treatment/Exercise - 03/30/19 0001      Lumbar Exercises: Aerobic   UBE (Upper Arm Bike)  5 min L 1 unable to maintain posture       Lumbar Exercises: Standing   Functional Squats  10 reps    Functional Squats Limitations  10 KB     Lifting  10 reps    Lifting Weights (lbs)  daed lift 10 lbs     Forward Lunge  10 reps    Side Lunge  10 reps    Row  Limitations  upright row 10 lbs x 10       Lumbar Exercises: Supine   Bent Knee Raise  10 reps    Bent Knee Raise Limitations  unable to do from table top without neck tension     Dead Bug  10 reps    Bridge with [employer]  10 reps      Lumbar Exercises: Quadruped   Other Quadruped Lumbar Exercises  UE and LE lift, one at a time for core stability then bird dog x 5 each side              PT Education - 03/30/19 1332    Education Details  form with exercises    Person(s) Educated  Patient    Methods  Explanation;Handout    Comprehension  Verbalized understanding;Verbal cues required  PT Long Term Goals - 03/30/19 1238      PT LONG TERM GOAL #1   Title  She will be able to demo final HEP with good technique    Status  On-going      PT LONG TERM GOAL #2   Title  Pt will be able to stand 4-5 hours with pain relieved with rest in order to perform work duties    Status  On-going      PT LONG TERM GOAL #3   Title  Pt will understand posture, safe lifting as it pertains to back pain and joint protection.    Status  On-going      PT LONG TERM GOAL #4   Title  Pt will be able to hold plank position for 30 sec with no pain in back, good form to demo increased core strength    Status  On-going      PT LONG TERM GOAL #5   Title  Pt wil be able to exercise at home 30 min x 3 days per week without increased pain following in joints (muscular soreness OK)    Status  On-going            Plan - 03/30/19 1236    Clinical Impression Statement  Patient had not done her HEP since last vist, was out of town.  She was tired today, unable to do some of her HEP due to neck tension, weakness.  Modifed HEP for now.  She does have good form and body awarness with lifting, squatting and deadlift.  Cont to benefit from PT for overall strength and endurance.    PT Treatment/Interventions  ADLs/Self Care Home Management;Patient/family education;Moist Heat;Therapeutic  activities;Ultrasound;Therapeutic exercise;Taping;Neuromuscular re-education;Electrical Stimulation;Cryotherapy;Manual techniques    PT Next Visit Plan  continue with gross strengthening- upper body.  Cont lifting, squatting and consider more dumbbells    PT Home Exercise Plan  ITB stretch, knee to chest, hip abd and bridge with blue band, lunges, squats, planks, qped lawn mower    Consulted and Agree with Plan of Care  Patient       Patient will benefit from skilled therapeutic intervention in order to improve the following deficits and impairments:     Visit Diagnosis: Muscle weakness (generalized)  Chronic bilateral low back pain without sciatica  Generalized joint pain     Problem List Patient Active Problem List   Diagnosis Date Noted  . Low back pain 02/24/2019  . Bilateral knee pain 02/24/2019  . Bilateral hip pain 02/24/2019  . Acute bilateral ankle pain 02/24/2019  . Healthcare maintenance 12/23/2018  . Generalized anxiety disorder 04/03/2018  . Attention deficit hyperactivity disorder (ADHD), combined type, severe 04/03/2018  . Mild recurrent major depression ([place]) 04/03/2018  . Developmental coordination disorder 04/03/2018  . Allergy with anaphylaxis due to food 02/02/2016  . Allergic rhinitis due to pollen 02/02/2016    Linda Biehn 03/30/2019, 1:41 PM  [facility] [address] [place], [place], [address] Phone: [phone]   Fax:  [phone]  Name: Alayla Dethlefs MRN: [id number] Date of Birth: 12/03/2000  Raeford Razor, PT 03/30/19 1:42 PM Phone: [phone] Fax: [phone]

## 2019-03-31 DIAGNOSIS — N809 Endometriosis, unspecified: Secondary | ICD-10-CM | POA: Diagnosis not present

## 2019-03-31 DIAGNOSIS — Z131 Encounter for screening for diabetes mellitus: Secondary | ICD-10-CM | POA: Diagnosis not present

## 2019-03-31 DIAGNOSIS — N760 Acute vaginitis: Secondary | ICD-10-CM | POA: Diagnosis not present

## 2019-03-31 DIAGNOSIS — Z113 Encounter for screening for infections with a predominantly sexual mode of transmission: Secondary | ICD-10-CM | POA: Diagnosis not present

## 2019-03-31 DIAGNOSIS — Z01419 Encounter for gynecological examination (general) (routine) without abnormal findings: Secondary | ICD-10-CM | POA: Diagnosis not present

## 2019-03-31 DIAGNOSIS — N920 Excessive and frequent menstruation with regular cycle: Secondary | ICD-10-CM | POA: Diagnosis not present

## 2019-03-31 DIAGNOSIS — Z3041 Encounter for surveillance of contraceptive pills: Secondary | ICD-10-CM | POA: Diagnosis not present

## 2019-04-01 ENCOUNTER — Other Ambulatory Visit: Payer: Self-pay

## 2019-04-01 ENCOUNTER — Ambulatory Visit: Payer: BC Managed Care – PPO | Admitting: Physical Therapy

## 2019-04-01 ENCOUNTER — Encounter: Payer: Self-pay | Admitting: Physical Therapy

## 2019-04-01 DIAGNOSIS — G8929 Other chronic pain: Secondary | ICD-10-CM | POA: Diagnosis not present

## 2019-04-01 DIAGNOSIS — M2550 Pain in unspecified joint: Secondary | ICD-10-CM | POA: Diagnosis not present

## 2019-04-01 DIAGNOSIS — M6281 Muscle weakness (generalized): Secondary | ICD-10-CM | POA: Diagnosis not present

## 2019-04-01 DIAGNOSIS — M545 Low back pain, unspecified: Secondary | ICD-10-CM

## 2019-04-01 NOTE — Therapy (Signed)
[facility] Outpatient Rehabilitation [facility] [address] [place], [place], [address] Phone: [phone]   Fax:  [phone]  Physical Therapy Treatment  Patient Details  Name: Cassandra Schultz MRN: [id number] Date of Birth: 11-Oct-2000 Referring Provider (PT): William Hamburger , NP    Encounter Date: 04/01/2019  PT End of Session - 04/01/19 1444    Visit Number  6    Number of Visits  12    Date for PT Re-Evaluation  04/28/19    PT Start Time  1450    PT Stop Time  1530    PT Time Calculation (min)  40 min    Activity Tolerance  Patient tolerated treatment well    Behavior During Therapy  [facility] for tasks assessed/performed       Past Medical History:  Diagnosis Date  . ADHD   . Allergy   . Anxiety   . Depression    reports ADHD     Past Surgical History:  Procedure Laterality Date  . NO PAST SURGERIES      There were no vitals filed for this visit.  Subjective Assessment - 04/01/19 1451    Subjective  Worked 2 - 13 hr shifts.  Ball of foot sore.  L knee hurt as well.  Felt like knee cap was pointing outward.    Currently in Pain?  No/denies        [facility] Adult PT Treatment/Exercise - 04/01/19 0001      Self-Care   Self-Care  Other Self-Care Comments    Other Self-Care Comments   discussed plan for post PT: types of exervcise, [place] for cadio, bone building , online streaming, subscription       Lumbar Exercises: Aerobic   Recumbent Bike  5 min L 6 UE and LE       Lumbar Exercises: Supine   Pelvic Tilt  10 reps    Clam  10 reps    Clam Limitations  green band each side with ball under sacrum     Dead Bug  10 reps    Dead Bug Limitations  ball       Lumbar Exercises: Sidelying   Hip Abduction  Both;10 reps    Hip Abduction Weights (lbs)  then circles x 10 each directions     Hip Abduction Limitations  then adduction bottom leg x 10     Other Sidelying Lumbar Exercises  side plank 15 sec elbow, legs straight        Knee/Hip Exercises:  Supine   Straight Leg Raises  Strengthening;Both;1 set;10 reps    Straight Leg Raises Limitations  ball under low back     Other Supine Knee/Hip Exercises  isometric bridge with fly, chest press and triceps  with 4-6 lbs x 10 each       Shoulder Exercises: Supine   Other Supine Exercises  fly, chest press and triceps press x 10 , 4-6 lbs each              PT Education - 04/01/19 1922    Education Details  plan for home exercise post PT    Person(s) Educated  Patient    Methods  Explanation;Demonstration    Comprehension  Verbalized understanding;Returned demonstration          PT Long Term Goals - 03/30/19 1238      PT LONG TERM GOAL #1   Title  She will be able to demo final HEP with good technique    Status  On-going      PT LONG TERM GOAL #2   Title  Pt will be able to stand 4-5 hours with pain relieved with rest in order to perform work duties    Status  On-going      PT [place] #3   Title  Pt will understand posture, safe lifting as it pertains to back pain and joint protection.    Status  On-going      PT LONG TERM GOAL #4   Title  Pt will be able to hold plank position for 30 sec with no pain in back, good form to demo increased core strength    Status  On-going      PT LONG TERM GOAL #5   Title  Pt wil be able to exercise at home 30 min x 3 days per week without increased pain following in joints (muscular soreness OK)    Status  On-going            Plan - 04/01/19 1923    Clinical Impression Statement  Pt with gross joint pain intermittently but not during PT session.  She typcially has pain in her back, hips and knees  She is interested in building muscle mass.  Hopefully she can improve her energy levels when her work schedule improves.  Gave her info on [place] classes in studio and online as I think it would benefit her and she would enjoy them.    PT Treatment/Interventions  ADLs/Self Care Home Management;Patient/family education;Moist  Heat;Therapeutic activities;Ultrasound;Therapeutic exercise;Taping;Neuromuscular re-education;Electrical Stimulation;Cryotherapy;Manual techniques    PT Next Visit Plan  continue with gross strengthening- upper, lower body Cont lifting, squatting and focus on form, posture . Consider tape to knee if still unstable,    PT Home Exercise Plan  ITB stretch, knee to chest, hip abd and bridge with blue band, lunges, squats, planks, qped lawn mower    Consulted and Agree with Plan of Care  Patient       Patient will benefit from skilled therapeutic intervention in order to improve the following deficits and impairments:  Difficulty walking, Pain, Impaired flexibility, Postural dysfunction, Decreased strength, Decreased mobility  Visit Diagnosis: Muscle weakness (generalized)  Chronic bilateral low back pain without sciatica  Generalized joint pain     Problem List Patient Active Problem List   Diagnosis Date Noted  . Low back pain 02/24/2019  . Bilateral knee pain 02/24/2019  . Bilateral hip pain 02/24/2019  . Acute bilateral ankle pain 02/24/2019  . Healthcare maintenance 12/23/2018  . Generalized anxiety disorder 04/03/2018  . Attention deficit hyperactivity disorder (ADHD), combined type, severe 04/03/2018  . Mild recurrent major depression ([place]) 04/03/2018  . Developmental coordination disorder 04/03/2018  . Allergy with anaphylaxis due to food 02/02/2016  . Allergic rhinitis due to pollen 02/02/2016    Cassandra Schultz 04/01/2019, 7:33 PM  [place], [place], [address] Phone: [phone]   Fax:  [phone]  Name: Cassandra Schultz MRN: [id number] Date of Birth: 2000-10-10  Raeford Razor, PT 04/01/19 7:33 PM Phone: [phone] Fax: [phone]

## 2019-04-02 DIAGNOSIS — F411 Generalized anxiety disorder: Secondary | ICD-10-CM | POA: Diagnosis not present

## 2019-04-02 DIAGNOSIS — F902 Attention-deficit hyperactivity disorder, combined type: Secondary | ICD-10-CM | POA: Diagnosis not present

## 2019-04-02 DIAGNOSIS — F331 Major depressive disorder, recurrent, moderate: Secondary | ICD-10-CM | POA: Diagnosis not present

## 2019-04-02 DIAGNOSIS — F4312 Post-traumatic stress disorder, chronic: Secondary | ICD-10-CM | POA: Diagnosis not present

## 2019-04-03 DIAGNOSIS — F3342 Major depressive disorder, recurrent, in full remission: Secondary | ICD-10-CM | POA: Diagnosis not present

## 2019-04-03 DIAGNOSIS — F900 Attention-deficit hyperactivity disorder, predominantly inattentive type: Secondary | ICD-10-CM | POA: Diagnosis not present

## 2019-04-06 ENCOUNTER — Encounter: Payer: Self-pay | Admitting: Physical Therapy

## 2019-04-06 ENCOUNTER — Other Ambulatory Visit: Payer: Self-pay

## 2019-04-06 ENCOUNTER — Ambulatory Visit: Payer: BC Managed Care – PPO | Admitting: Physical Therapy

## 2019-04-06 DIAGNOSIS — G8929 Other chronic pain: Secondary | ICD-10-CM

## 2019-04-06 DIAGNOSIS — M2550 Pain in unspecified joint: Secondary | ICD-10-CM

## 2019-04-06 DIAGNOSIS — M6281 Muscle weakness (generalized): Secondary | ICD-10-CM

## 2019-04-06 DIAGNOSIS — M545 Low back pain: Secondary | ICD-10-CM | POA: Diagnosis not present

## 2019-04-06 NOTE — Patient Instructions (Signed)
Seated roll up/down with green band x 10  Seated row green band x 10  Combo : 1/2 roll with row x 10

## 2019-04-06 NOTE — Therapy (Signed)
[place], [place], [address] Phone: [phone]   Fax:  [phone]  Physical Therapy Treatment  Patient Details  Name: Cassandra Schultz MRN: [id number] Date of Birth: 09/25/2000 Referring Provider (PT): Mina Marble , NP    Encounter Date: 04/06/2019  PT End of Session - 04/06/19 1330    Visit Number  7    Number of Visits  12    Date for PT Re-Evaluation  04/28/19    PT Start Time  1320    PT Stop Time  1401    PT Time Calculation (min)  41 min    Activity Tolerance  Patient tolerated treatment well    Behavior During Therapy  [facility] for tasks assessed/performed       Past Medical History:  Diagnosis Date  . ADHD   . Allergy   . Anxiety   . Depression    reports ADHD     Past Surgical History:  Procedure Laterality Date  . NO PAST SURGERIES      There were no vitals filed for this visit.  Subjective Assessment - 04/06/19 1311    Subjective  Did my exercises Sat. Was reallly sore yesterday.  Is that normal? Going to [employer] next week.    Currently in Pain?  No/denies   just achy and sore      [facility] Adult PT Treatment/Exercise - 04/06/19 0001      Lumbar Exercises: Stretches   Active Hamstring Stretch  2 reps;30 seconds    Single Knee to Chest Stretch  2 reps;20 seconds      Lumbar Exercises: Aerobic   Elliptical  level 10 ramp and 1 resist.  for 5 min       Lumbar Exercises: Seated   Other Seated Lumbar Exercises  roll up with green T band for assist, then seated row x 10 green band, then partial curl and row x 10     Other Seated Lumbar Exercises  bicep curl x 10 green band       Lumbar Exercises: Supine   Heel Slides  10 reps    Heel Slides Limitations  sliders     Bridge Limitations  bridge with knee ext on core slider    x 5 each    Bridge with clamshell  10 reps      Lumbar Exercises: Quadruped   Single Arm Raise  5 reps    Single Arm Raises Limitations  slider  circles     Other Quadruped Lumbar Exercises  plank with core sliders, "mtn climber" x 5       Knee/Hip Exercises: Stretches   ITB Stretch  2 reps;30 seconds    Piriformis Stretch  2 reps;30 seconds             PT Education - 04/06/19 1405    Education Details  HEP, printed text for roll  down, POC    Person(s) Educated  Patient    Methods  Explanation    Comprehension  Verbalized understanding;Returned demonstration          PT Long Term Goals - 04/06/19 1330      PT LONG TERM GOAL #1   Title  She will be able to demo final HEP with good technique    Status  On-going      PT LONG TERM GOAL #2   Title  Pt will be able to stand 4-5 hours with pain relieved with rest in order  to perform work duties    Baseline  needs to sit in a chair with full support, not sure    Status  On-going      PT LONG TERM GOAL #3   Title  Pt will understand posture, safe lifting as it pertains to back pain and joint protection.    Status  On-going      PT LONG TERM GOAL #4   Title  Pt will be able to hold plank position for 30 sec with no pain in back, good form to demo increased core strength            Plan - 04/06/19 1340    Clinical Impression Statement  Patient with UE soreness from workout 2 days ago.  She has a poor computer set up at home, often sits cross legged in various configurations.  She has really good form with exercises. Recommended a table or even the couch for better joint protection.  She continues to have pain in her back and feet with long periods of standing, walking at work.  She is improving and learning self care tips for maintaining joint health and improving strength.    PT Treatment/Interventions  ADLs/Self Care Home Management;Patient/family education;Moist Heat;Therapeutic activities;Ultrasound;Therapeutic exercise;Taping;Neuromuscular re-education;Electrical Stimulation;Cryotherapy;Manual techniques    PT Next Visit Plan  continue with gross  strengthening- upper, lower body Cont lifting, squatting and focus on form, posture .    PT Home Exercise Plan  ITB stretch, knee to chest, hip abd and bridge with blue band, lunges, squats, planks, qped lawn mower, roll down with green band    Consulted and Agree with Plan of Care  Patient       Patient will benefit from skilled therapeutic intervention in order to improve the following deficits and impairments:  Difficulty walking, Pain, Impaired flexibility, Postural dysfunction, Decreased strength, Decreased mobility  Visit Diagnosis: Muscle weakness (generalized)  Chronic bilateral low back pain without sciatica  Generalized joint pain     Problem List Patient Active Problem List   Diagnosis Date Noted  . Low back pain 02/24/2019  . Bilateral knee pain 02/24/2019  . Bilateral hip pain 02/24/2019  . Acute bilateral ankle pain 02/24/2019  . Healthcare maintenance 12/23/2018  . Generalized anxiety disorder 04/03/2018  . Attention deficit hyperactivity disorder (ADHD), combined type, severe 04/03/2018  . Mild recurrent major depression (HCC) 04/03/2018  . Developmental coordination disorder 04/03/2018  . Allergy with anaphylaxis due to food 02/02/2016  . Allergic rhinitis due to pollen 02/02/2016    Nicolas Banh 04/06/2019, 2:09 PM  [facility] [address] [place], [place], [address] Phone: [phone]   Fax:  [phone]  Name: Cassandra Schultz MRN: [id number] Date of Birth: [birth year]  [name], PT 04/06/19 2:09 PM Phone: [phone] Fax: [phone]

## 2019-04-08 ENCOUNTER — Other Ambulatory Visit: Payer: Self-pay

## 2019-04-08 ENCOUNTER — Encounter: Payer: Self-pay | Admitting: Physical Therapy

## 2019-04-08 ENCOUNTER — Ambulatory Visit: Payer: BC Managed Care – PPO | Admitting: Physical Therapy

## 2019-04-08 DIAGNOSIS — M6281 Muscle weakness (generalized): Secondary | ICD-10-CM | POA: Diagnosis not present

## 2019-04-08 DIAGNOSIS — G8929 Other chronic pain: Secondary | ICD-10-CM | POA: Diagnosis not present

## 2019-04-08 DIAGNOSIS — M2550 Pain in unspecified joint: Secondary | ICD-10-CM | POA: Diagnosis not present

## 2019-04-08 DIAGNOSIS — M545 Low back pain: Secondary | ICD-10-CM | POA: Diagnosis not present

## 2019-04-08 NOTE — Therapy (Signed)
[facility] [address] [place], [place], [address] Phone: [phone]   Fax:  [phone]  Physical Therapy Treatment  Patient Details  Name: Cassandra Schultz MRN: [id number] Date of Birth: 27-Feb-2001 Referring Provider (PT): William Hamburger , NP    Encounter Date: 04/08/2019  PT End of Session - 04/08/19 1419    Visit Number  8    Number of Visits  12    Date for PT Re-Evaluation  04/28/19    PT Start Time  1416    PT Stop Time  1458    PT Time Calculation (min)  42 min    Activity Tolerance  Patient tolerated treatment well    Behavior During Therapy  [facility] for tasks assessed/performed       Past Medical History:  Diagnosis Date  . ADHD   . Allergy   . Anxiety   . Depression    reports ADHD     Past Surgical History:  Procedure Laterality Date  . NO PAST SURGERIES      There were no vitals filed for this visit.  Subjective Assessment - 04/08/19 1418    Subjective  Lt anterior ankle pain in active DF, maybe because I went running yesterday. My back feels good today, it feels more flexible than normal. I tried changing how I was sitting but I need to just be in a chair.    Patient Stated Goals  Patient would like to learn exercise to protect my joints and muscles    Currently in Pain?  No/denies                       [facility] Adult PT Treatment/Exercise - 04/08/19 0001      Lumbar Exercises: Supine   Bridge Limitations  bridge with single foot slide    Other Supine Lumbar Exercises  scissors    Other Supine Lumbar Exercises  lower curl      Lumbar Exercises: Prone   Other Prone Lumbar Exercises  upper lift- 4x15s      Lumbar Exercises: Quadruped   Plank  FA plank with abduction taps    Other Quadruped Lumbar Exercises  primal push ups with knee drive    Other Quadruped Lumbar Exercises  bear crawls, push ups-wide &triceps      Knee/Hip Exercises: Stretches   Passive Hamstring Stretch  Limitations  supine with strap- midline & lateral      Knee/Hip Exercises: Standing   Functional Squat Limitations  2lb arms at 90 deg    Other Standing Knee Exercises  sumo squat+ abduction punches 2lb      Knee/Hip Exercises: Prone   Hamstring Curl  10 reps    Hamstring Curl Limitations  green tband, both                  PT Long Term Goals - 04/06/19 1330      PT LONG TERM GOAL #1   Title  She will be able to demo final HEP with good technique    Status  On-going      PT LONG TERM GOAL #2   Title  Pt will be able to stand 4-5 hours with pain relieved with rest in order to perform work duties    Baseline  needs to sit in a chair with full support, not sure    Status  On-going      PT LONG TERM GOAL #3   Title  Pt  will understand posture, safe lifting as it pertains to back pain and joint protection.    Status  On-going      PT LONG TERM GOAL #4   Title  Pt will be able to hold plank position for 30 sec with no pain in back, good form to demo increased core strength            Plan - 04/08/19 1458    Clinical Impression Statement  Continued to build on basic exercise forms so she is able to be creative with proper muscle work. Pt reports she feels like she is ready to be on her own, will do one more f/u in 2 weeks to checkthat final HEP was appropriate.    PT Treatment/Interventions  ADLs/Self Care Home Management;Patient/family education;Moist Heat;Therapeutic activities;Ultrasound;Therapeutic exercise;Taping;Neuromuscular re-education;Electrical Stimulation;Cryotherapy;Manual techniques    PT Next Visit Plan  d/c if doing well    PT Home Exercise Plan  ITB stretch, knee to chest, hip abd and bridge with blue band, lunges, squats, planks, qped lawn mower, roll down with green band    Consulted and Agree with Plan of Care  Patient       Patient will benefit from skilled therapeutic intervention in order to improve the following deficits and impairments:   Difficulty walking, Pain, Impaired flexibility, Postural dysfunction, Decreased strength, Decreased mobility  Visit Diagnosis: Muscle weakness (generalized)  Chronic bilateral low back pain without sciatica  Generalized joint pain     Problem List Patient Active Problem List   Diagnosis Date Noted  . Low back pain 02/24/2019  . Bilateral knee pain 02/24/2019  . Bilateral hip pain 02/24/2019  . Acute bilateral ankle pain 02/24/2019  . Healthcare maintenance 12/23/2018  . Generalized anxiety disorder 04/03/2018  . Attention deficit hyperactivity disorder (ADHD), combined type, severe 04/03/2018  . Mild recurrent major depression ([place]) 04/03/2018  . Developmental coordination disorder 04/03/2018  . Allergy with anaphylaxis due to food 02/02/2016  . Allergic rhinitis due to pollen 02/02/2016    Terrah Decoster C. Ailey Wessling PT, DPT 04/08/19 3:01 PM   [place], [place], [address] Phone: [phone]   Fax:  [phone]  Name: Cassandra Schultz MRN: [id number] Date of Birth: March 14, 2001

## 2019-04-09 DIAGNOSIS — F902 Attention-deficit hyperactivity disorder, combined type: Secondary | ICD-10-CM | POA: Diagnosis not present

## 2019-04-09 DIAGNOSIS — F4312 Post-traumatic stress disorder, chronic: Secondary | ICD-10-CM | POA: Diagnosis not present

## 2019-04-09 DIAGNOSIS — F331 Major depressive disorder, recurrent, moderate: Secondary | ICD-10-CM | POA: Diagnosis not present

## 2019-04-09 DIAGNOSIS — F411 Generalized anxiety disorder: Secondary | ICD-10-CM | POA: Diagnosis not present

## 2019-04-10 ENCOUNTER — Telehealth: Payer: Self-pay | Admitting: Adult Health

## 2019-04-10 NOTE — Telephone Encounter (Signed)
Patient called says poss yeast infection -- was unable to get in w/ gyn for appt & tried [place] (advised pt Cassandra Schultz not in office on Friday & no appt available w/ Cassandra Schultz.  --Patient to seek treatment @ Urgent Care.  -- fyi to medical staff  --glh

## 2019-04-16 DIAGNOSIS — F4312 Post-traumatic stress disorder, chronic: Secondary | ICD-10-CM | POA: Diagnosis not present

## 2019-04-16 DIAGNOSIS — F902 Attention-deficit hyperactivity disorder, combined type: Secondary | ICD-10-CM | POA: Diagnosis not present

## 2019-04-16 DIAGNOSIS — F331 Major depressive disorder, recurrent, moderate: Secondary | ICD-10-CM | POA: Diagnosis not present

## 2019-04-16 DIAGNOSIS — F411 Generalized anxiety disorder: Secondary | ICD-10-CM | POA: Diagnosis not present

## 2019-04-23 ENCOUNTER — Other Ambulatory Visit: Payer: Self-pay

## 2019-04-23 ENCOUNTER — Ambulatory Visit: Payer: BC Managed Care – PPO | Attending: Adult Health | Admitting: Physical Therapy

## 2019-04-23 ENCOUNTER — Encounter: Payer: Self-pay | Admitting: Physical Therapy

## 2019-04-23 DIAGNOSIS — F411 Generalized anxiety disorder: Secondary | ICD-10-CM | POA: Diagnosis not present

## 2019-04-23 DIAGNOSIS — F902 Attention-deficit hyperactivity disorder, combined type: Secondary | ICD-10-CM | POA: Diagnosis not present

## 2019-04-23 DIAGNOSIS — M545 Low back pain: Secondary | ICD-10-CM | POA: Insufficient documentation

## 2019-04-23 DIAGNOSIS — F331 Major depressive disorder, recurrent, moderate: Secondary | ICD-10-CM | POA: Diagnosis not present

## 2019-04-23 DIAGNOSIS — M2550 Pain in unspecified joint: Secondary | ICD-10-CM

## 2019-04-23 DIAGNOSIS — G8929 Other chronic pain: Secondary | ICD-10-CM | POA: Insufficient documentation

## 2019-04-23 DIAGNOSIS — M6281 Muscle weakness (generalized): Secondary | ICD-10-CM

## 2019-04-23 DIAGNOSIS — F4312 Post-traumatic stress disorder, chronic: Secondary | ICD-10-CM | POA: Diagnosis not present

## 2019-04-23 NOTE — Therapy (Addendum)
[place], [place], [address] Phone: [phone]   Fax:  [phone]  Physical Therapy Treatment/Discharge  Patient Details  Name: Cassandra Schultz MRN: [id number] Date of Birth: 09-Mar-2001 Referring Provider (PT): Mina Marble , NP    Encounter Date: 04/23/2019  PT End of Session - 04/23/19 0819    Visit Number  9    Number of Visits  12    Date for PT Re-Evaluation  04/28/19    PT Start Time  0811   pt arrived late   PT Stop Time  0837    PT Time Calculation (min)  26 min    Activity Tolerance  Patient tolerated treatment well    Behavior During Therapy  [facility] for tasks assessed/performed       Past Medical History:  Diagnosis Date  . ADHD   . Allergy   . Anxiety   . Depression    reports ADHD     Past Surgical History:  Procedure Laterality Date  . NO PAST SURGERIES      There were no vitals filed for this visit.  Subjective Assessment - 04/23/19 0816    Subjective  Stiff in the mornings. Got new shoes and they feel really good. New job is going well. I can feel my back at the end of the day. Rt knee is bothering me this morning.    Patient Stated Goals  Patient would like to learn exercise to protect my joints and muscles         [facility] PT Assessment - 04/23/19 0001      Assessment   Medical Diagnosis  low back pain, hip, knee and ankle pain     Referring Provider (PT)  Mina Marble , NP     Onset Date/Surgical Date  --   chronic   Hand Dominance  Right    Prior Therapy  Yes       Precautions   Precautions  None      Restrictions   Weight Bearing Restrictions  No      Balance Screen   Has the patient fallen in the past 6 months  No      Home Environment   Living Arrangements  Non-relatives/Friends    Home Layout  Two level    Additional Comments  currently living with her friend's mom       Prior Function   Level of Independence  Independent    Vocation  Part time  employment;Student    Vocation Requirements  standing long periods, cases of produce intermittently     Leisure  used to do school, running, exercise, art , friends       Cognition   Overall Cognitive Status  Within Functional Limits for tasks assessed      Strength   Right Hip ABduction  5/5    Right Hip ADduction  5/5    Left Hip ABduction  5/5    Left Hip ADduction  4+/5      Flexibility   Hamstrings  WFL    ITB  WFL      Palpation   Palpation comment  Denies TTP in knees or back                           PT Education - 04/23/19 0830    Education Details  goals, HEP, self STM with tennis ball, importance of continued HEP  Person(s) Educated  Patient    Methods  Explanation    Comprehension  Verbalized understanding          PT Long Term Goals - 04/23/19 0819      PT LONG TERM GOAL #1   Title  She will be able to demo final HEP with good technique    Status  Achieved      PT LONG TERM GOAL #2   Title  Pt will be able to stand 4-5 hours with pain relieved with rest in order to perform work duties    Status  Achieved      PT [place] #3   Title  Pt will understand posture, safe lifting as it pertains to back pain and joint protection.    Status  Achieved      PT LONG TERM GOAL #4   Title  Pt will be able to hold plank position for 30 sec with no pain in back, good form to demo increased core strength    Status  Achieved      PT LONG TERM GOAL #5   Title  Pt wil be able to exercise at home 30 min x 3 days per week without increased pain following in joints (muscular soreness OK)    Baseline  I would say I get about every other day    Status  Achieved            Plan - 04/23/19 0840    Clinical Impression Statement  Pt has met all of her goals and made significant improvements in strength and exercise ability. Encouraged her to continue with HEP and rest breaks throughout the day. Pt verbalized comfort and understanding and was  encouraged to contact us with any further questions.    PT Treatment/Interventions  ADLs/Self Care Home Management;Patient/family education;Moist Heat;Therapeutic activities;Ultrasound;Therapeutic exercise;Taping;Neuromuscular re-education;Electrical Stimulation;Cryotherapy;Manual techniques    PT Home Exercise Plan  ITB stretch, knee to chest, hip abd and bridge with blue band, lunges, squats, planks, qped lawn mower, roll down with green band    Consulted and Agree with Plan of Care  Patient       Patient will benefit from skilled therapeutic intervention in order to improve the following deficits and impairments:  Difficulty walking, Pain, Impaired flexibility, Postural dysfunction, Decreased strength, Decreased mobility  Visit Diagnosis: Muscle weakness (generalized)  Chronic bilateral low back pain without sciatica  Generalized joint pain     Problem List Patient Active Problem List   Diagnosis Date Noted  . Low back pain 02/24/2019  . Bilateral knee pain 02/24/2019  . Bilateral hip pain 02/24/2019  . Acute bilateral ankle pain 02/24/2019  . Healthcare maintenance 12/23/2018  . Generalized anxiety disorder 04/03/2018  . Attention deficit hyperactivity disorder (ADHD), combined type, severe 04/03/2018  . Mild recurrent major depression ([place]) 04/03/2018  . Developmental coordination disorder 04/03/2018  . Allergy with anaphylaxis due to food 02/02/2016  . Allergic rhinitis due to pollen 02/02/2016  PHYSICAL THERAPY DISCHARGE SUMMARY  Visits from Start of Care: 9  Current functional level related to goals / functional outcomes: See above   Remaining deficits: See above   Education / Equipment: Anatomy of condition, POC, HEP, exercise form/rationale  Plan: Patient agrees to discharge.  Patient goals were met. Patient is being discharged due to meeting the stated rehab goals.  ?????       Erving Sassano C. Lealon Vanputten PT, DPT 04/23/19 8:43 AM   [place] [facility] [phone]  [place], [place], [address] Phone: [phone]   Fax:  [phone]  Name: Cassandra Schultz MRN: [id number] Date of Birth: [birth year]

## 2019-04-28 ENCOUNTER — Encounter: Payer: Self-pay | Admitting: Registered"

## 2019-04-28 ENCOUNTER — Other Ambulatory Visit: Payer: Self-pay

## 2019-04-28 ENCOUNTER — Encounter: Payer: BC Managed Care – PPO | Attending: Adult Health | Admitting: Registered"

## 2019-04-28 DIAGNOSIS — Z713 Dietary counseling and surveillance: Secondary | ICD-10-CM

## 2019-04-28 NOTE — Progress Notes (Signed)
Medical Nutrition Therapy:  Appt start time: 0940 end time: 1000.  Patient arrived late and was concerned about getting to work late. RD only had time to start assessment. Pt states she will call back to schedule follow-up appointment to help create an eating strategy to address her concerns.  Assessment:  Primary concerns today: Pt states she requested this appointment because she wants to know what she can eat so she doesn't die. Pt reports diet is limited due to food allergies and she is a picky eater.  Weight: Ht: 120 lb 1.6 oz (54.5 kg) as of 02/24/2019 5'8.25"     Percentile: 35 %, Z= -0.39*   Pt states she does not need a lot of variety and can eat the same thing everyday. She wants to know what foods she can eat that will ensure she is getting all the nutrition she needs.   At arrival of visit patient states she is very stressed because she hasn't had enough sleep or enough to eat and she really hates being late for appointments (~20 min late today). Pt was also worried about being late for work after this appointment. Patient states she doesn't have time to stop for something to eat before work, has goldfish and triscuits with her.  Food restrictions due to allergies: OARs: Uncooked fruits and vegetables makes mouth itch. Eats mangos occasionally.  (anaphylaxis) Peanuts, tree nuts, soy   Pt states she is a picky eater, thinks cow's milk is gross, but eats [place] yogurt. Uses oat milk.  Foods she likes/eats: Any grain, chicken (only type of meat), clementines, grapes, pineapple, watermelon, mangos. Vegetables: broccoli, carrots, green beans, potatoes, bell peppers, spinach, lettuce, tomatoes, zucchini, cucumbers, avocados, corn. Likes dried beans. Eats sunflower seeds/pumpkin seeds but they do not fill her up.  Sleep: 6-12 hrs usually 7-9; changes in medication and stuff going on in life needs a lot of sleep, usually tired throughout the day. Pt currently is followed by phychiatrist  and counselor.  Preferred Learning Style:   No preference indicated   Learning Readiness:   Ready  MEDICATIONS: reviewd   DIETARY INTAKE:  24-hr recall: not assessed B ( AM):   Snk ( AM):   L ( PM):  Snk ( PM):  D ( PM):  Snk ( PM):  Beverages:   Usual physical activity: 5-10 hrs day on feet at work; every other day physical therapy exercises & yoga ~ 1 hr.  Estimated energy needs: 2000-2200 calories  Progress Towards Goal(s):  None established yet, assessment only visit.   Nutritional Diagnosis:  NI-1.4 Inadequate energy intake As related to food restrictions d/t allergies.  As evidenced by pt reported feels cannot get enough to eat. BMI 18.    Intervention:  Nutrition education. Discussed protein content of food is what causes allergies and FDA requirement for the top 8 allergens to be disclosed on packaging.  Teaching Method Utilized:  Visual Auditory  Handouts given during visit include:  none  Barriers to learning/adherence to lifestyle change: none noted  Demonstrated degree of understanding via:  Teach Back   Monitoring/Evaluation:  Dietary intake, exercise, 3and body weight in 2 week(s).

## 2019-04-30 DIAGNOSIS — F902 Attention-deficit hyperactivity disorder, combined type: Secondary | ICD-10-CM | POA: Diagnosis not present

## 2019-04-30 DIAGNOSIS — F411 Generalized anxiety disorder: Secondary | ICD-10-CM | POA: Diagnosis not present

## 2019-04-30 DIAGNOSIS — F331 Major depressive disorder, recurrent, moderate: Secondary | ICD-10-CM | POA: Diagnosis not present

## 2019-04-30 DIAGNOSIS — F4312 Post-traumatic stress disorder, chronic: Secondary | ICD-10-CM | POA: Diagnosis not present

## 2019-05-07 DIAGNOSIS — F331 Major depressive disorder, recurrent, moderate: Secondary | ICD-10-CM | POA: Diagnosis not present

## 2019-05-07 DIAGNOSIS — F902 Attention-deficit hyperactivity disorder, combined type: Secondary | ICD-10-CM | POA: Diagnosis not present

## 2019-05-07 DIAGNOSIS — F4312 Post-traumatic stress disorder, chronic: Secondary | ICD-10-CM | POA: Diagnosis not present

## 2019-05-07 DIAGNOSIS — F411 Generalized anxiety disorder: Secondary | ICD-10-CM | POA: Diagnosis not present

## 2019-05-21 DIAGNOSIS — F902 Attention-deficit hyperactivity disorder, combined type: Secondary | ICD-10-CM | POA: Diagnosis not present

## 2019-05-21 DIAGNOSIS — F411 Generalized anxiety disorder: Secondary | ICD-10-CM | POA: Diagnosis not present

## 2019-05-21 DIAGNOSIS — F4312 Post-traumatic stress disorder, chronic: Secondary | ICD-10-CM | POA: Diagnosis not present

## 2019-05-21 DIAGNOSIS — F331 Major depressive disorder, recurrent, moderate: Secondary | ICD-10-CM | POA: Diagnosis not present

## 2019-05-25 NOTE — Progress Notes (Deleted)
Subjective:    Patient ID: Cassandra Schultz, female    DOB: 10-13-2000, 18 y.o.   MRN: [id number]  HPI: 02/24/2019 OV: Ms. Cassandra Schultz presents with chronic back pain, bil hip pain, bil knee pain, and bil ankle pain that started >3 years ago. She reports course of formal PT junior yr in HS She denies recent acute injury/trauma prior to onset of various pain sources. She is unable to rate pain, describes pain as "constant dull ache" that will worsen with repetitive movements. 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. She is FT Journalist, newspaper, living with friends parents as her relationship with her parents is toxic. She reports stable mood, denies SI/HI She denies tobacco/vape/ETOH/illicit drug use She reports that her mother has similar pain issues and is undergoing testing for RA  05/26/2019 OV:  Ms. Cassandra Schultz presents for  Patient Care Team    Relationship Specialty Notifications Start End  Julaine Fusi, NP PCP - General Family Medicine  12/23/18   Maeola Harman, MD  Pediatrics  04/03/18    Comment: Rhea Pink, MD Consulting Physician Psychiatry  12/24/18     Patient Active Problem List   Diagnosis Date Noted  . Low back pain 02/24/2019  . Bilateral knee pain 02/24/2019  . Bilateral hip pain 02/24/2019  . Acute bilateral ankle pain 02/24/2019  . Healthcare maintenance 12/23/2018  . Generalized anxiety disorder 04/03/2018  . Attention deficit hyperactivity disorder (ADHD), combined type, severe 04/03/2018  . Mild recurrent major depression (HCC) 04/03/2018  . Developmental coordination disorder 04/03/2018  . Allergy with anaphylaxis due to food 02/02/2016  . Allergic rhinitis due to pollen 02/02/2016     Past Medical History:  Diagnosis Date  . ADHD   . Allergy   . Anxiety   . Depression    reports ADHD      Past Surgical History:  Procedure Laterality Date  . NO PAST  SURGERIES       Family History  Problem Relation Age of Onset  . Allergic rhinitis Mother   . Depression Mother   . Arthritis Mother   . Allergic rhinitis Father   . Allergic rhinitis Paternal Aunt   . Alcohol abuse Maternal Grandmother   . Depression Maternal Grandmother   . Alcohol abuse Maternal Grandfather   . Angioedema Neg Hx   . Asthma Neg Hx   . Eczema Neg Hx   . Immunodeficiency Neg Hx   . Urticaria Neg Hx      Social History   Substance and Sexual Activity  Drug Use Never     Social History   Substance and Sexual Activity  Alcohol Use Never  . Frequency: Never     Social History   Tobacco Use  Smoking Status Never Smoker  Smokeless Tobacco Never Used     Outpatient Encounter Medications as of 05/26/2019  Medication Sig  . ALPRAZolam (XANAX) 0.25 MG tablet Take 0.25 mg by mouth 2 (two) times daily as needed.  Marland Kitchen amphetamine-dextroamphetamine (ADDERALL) 30 MG tablet Take 1 tablet by mouth 2 (two) times daily with breakfast and lunch.  . EPINEPHrine 0.3 mg/0.3 mL IJ SOAJ injection INJECT 0.3 MLS (0.3 MG TOTAL) INTO THE MUSCLE ONCE.  Marland Kitchen FERROUS SULFATE PO Take 27 mg by mouth daily.  Marland Kitchen FLUoxetine (PROZAC) 20 MG capsule Take 1 capsule by mouth daily.  Marland Kitchen LARISSIA 0.1-20 MG-MCG tablet  Take 1 tablet by mouth daily.  . Levocetirizine Dihydrochloride (XYZAL PO) Take 10 mg by mouth daily.   No facility-administered encounter medications on file as of 05/26/2019.     Allergies: Other, Peanut-containing drug products, Acyclovir and related, and Augmentin [amoxicillin-pot clavulanate]  There is no height or weight on file to calculate BMI.  There were no vitals taken for this visit.     Review of Systems     Objective:   Physical Exam        Assessment & Plan:  No diagnosis found.  No problem-specific Assessment & Plan notes found for this encounter.    FOLLOW-UP:  No follow-ups on file.

## 2019-05-26 ENCOUNTER — Ambulatory Visit: Payer: BC Managed Care – PPO | Admitting: Adult Health

## 2019-05-27 ENCOUNTER — Other Ambulatory Visit: Payer: Self-pay

## 2019-05-27 ENCOUNTER — Encounter: Payer: Self-pay | Admitting: Adult Health

## 2019-05-27 ENCOUNTER — Ambulatory Visit (INDEPENDENT_AMBULATORY_CARE_PROVIDER_SITE_OTHER): Payer: BC Managed Care – PPO | Admitting: Adult Health

## 2019-05-27 VITALS — BP 114/69 | HR 110 | Temp 99.4°F | Ht 68.25 in | Wt 132.9 lb

## 2019-05-27 DIAGNOSIS — F902 Attention-deficit hyperactivity disorder, combined type: Secondary | ICD-10-CM

## 2019-05-27 DIAGNOSIS — M5440 Lumbago with sciatica, unspecified side: Secondary | ICD-10-CM

## 2019-05-27 DIAGNOSIS — G8929 Other chronic pain: Secondary | ICD-10-CM

## 2019-05-27 DIAGNOSIS — Z0000 Encounter for general adult medical examination without abnormal findings: Secondary | ICD-10-CM

## 2019-05-27 DIAGNOSIS — R5383 Other fatigue: Secondary | ICD-10-CM | POA: Diagnosis not present

## 2019-05-27 DIAGNOSIS — E559 Vitamin D deficiency, unspecified: Secondary | ICD-10-CM | POA: Diagnosis not present

## 2019-05-27 MED ORDER — TIZANIDINE HCL 4 MG PO TABS: 4.0000 mg | ORAL_TABLET | Freq: Every day | ORAL | 0 refills | Status: DC

## 2019-05-27 NOTE — Assessment & Plan Note (Signed)
Oct 2020-Adderall reduced from 30mg  to 20mg  PMDP reviewed- refills support this Fatigue sx's began around timing of reduction of stimulant

## 2019-05-27 NOTE — Patient Instructions (Addendum)
Chronic Back Pain When back pain lasts longer than 3 months, it is called chronic back pain.The cause of your back pain may not be known. Some common causes include:  Wear and tear (degenerative disease) of the bones, ligaments, or disks in your back.  Inflammation and stiffness in your back (arthritis). People who have chronic back pain often go through certain periods in which the pain is more intense (flare-ups). Many people can learn to manage the pain with home care. Follow these instructions at home: Pay attention to any changes in your symptoms. Take these actions to help with your pain: Activity   Avoid bending and other activities that make the problem worse.  Maintain a proper position when standing or sitting: ? When standing, keep your upper back and neck straight, with your shoulders pulled back. Avoid slouching. ? When sitting, keep your back straight and relax your shoulders. Do not round your shoulders or pull them backward.  Do not sit or stand in one place for long periods of time.  Take brief periods of rest throughout the day. This will reduce your pain. Resting in a lying or standing position is usually better than sitting to rest.  When you are resting for longer periods, mix in some mild activity or stretching between periods of rest. This will help to prevent stiffness and pain.  Get regular exercise. Ask your health care provider what activities are safe for you.  Do not lift anything that is heavier than 10 lb (4.5 kg). Always use proper lifting technique, which includes: ? Bending your knees. ? Keeping the load close to your body. ? Avoiding twisting.  Sleep on a firm mattress in a comfortable position. Try lying on your side with your knees slightly bent. If you lie on your back, put a pillow under your knees. Managing pain  If directed, apply ice to the painful area. Your health care provider may recommend applying ice during the first 24-48 hours after  a flare-up begins. ? Put ice in a plastic bag. ? Place a towel between your skin and the bag. ? Leave the ice on for 20 minutes, 2-3 times per day.  If directed, apply heat to the affected area as often as told by your health care provider. Use the heat source that your health care provider recommends, such as a moist heat pack or a heating pad. ? Place a towel between your skin and the heat source. ? Leave the heat on for 20-30 minutes. ? Remove the heat if your skin turns bright red. This is especially important if you are unable to feel pain, heat, or cold. You may have a greater risk of getting burned.  Try soaking in a warm tub.  Take over-the-counter and prescription medicines only as told by your health care provider.  Keep all follow-up visits as told by your health care provider. This is important. Contact a health care provider if:  You have pain that is not relieved with rest or medicine. Get help right away if:  You have weakness or numbness in one or both of your legs or feet.  You have trouble controlling your bladder or your bowels.  You have nausea or vomiting.  You have pain in your abdomen.  You have shortness of breath or you faint. This information is not intended to replace advice given to you by your health care provider. Make sure you discuss any questions you have with your health care provider. Document Released: 07/12/2004   Document Revised: 09/25/2018 Document Reviewed: 12/12/2016 Elsevier Patient Education  [place] with Physical Therapy as directed. Referral to Orthopedic Specialist placed to address on-going back pain. Tizanidine 4mg  as needed at bedtime. Stretch often, use heating pad, Epson Salt baths. We will contact when lab results are available. Continue with your mental health care team as directed. Fatigue may be linked to reducing Adderall dosage. Continue healthy eating and regular meals. Continue to social distance  and wear a mask when in public. Follow-up in 3 months, sooner if needed. FEEL BETTER!

## 2019-05-27 NOTE — Assessment & Plan Note (Signed)
Oct 2020-Adderall reduced from 30mg  to 20mg  PMDP reviewed- refills support this Fatigue sx's began around timing of reduction of stimulant Non-fasting labs obtained today

## 2019-05-27 NOTE — Progress Notes (Signed)
Subjective:    Patient ID: Cassandra Schultz, female    DOB: 06/29/2000, 18 y.o.   MRN: [id number]  HPI:  [name] is here with two complaints- 1) Continued chronic lumbar back pain with intermittent sciatica. She has started with PT to improve overall conditioning and reduce back/knee pain- last OV 04/23/2019. She does not believe PT is helping reduce pain. Back pain will worsen when she is on her feet for work- she works at [employer]. She reports back pain will develop after standing/ambulating for a prolonged period- pain described as "stiffness and spasms" and rated 7/10. She has been using OTC NSAIDs. She denies acute accident/injury to back. She has never had MRI of spine. She denies change in bowel/bladder habits. She denies numbness/tingling in lower extremities.  Of note- grandfather has "some sort of muscle weakness disorder"  2) Increased fatigue, hypersomnolence. Her Adderall was decreased from 30mg  to 20mg  in Oct and sx's began afterwards. She denies tick bite. She has gained 12 lbs since last OV in 02/2019- she reports improved appetite, likely r/t to reduced in stimulant dosage. Per pt- her Psychiatrist is tapering her off Adderall to then start her on non-stimulant ADHD rx.  STI testing Jan 2020- neg RA factor 02/24/2019 neg  Labs completed today- she is non-fasting today  Patient Care Team    Relationship Specialty Notifications Start End  Feb 2020 D, NP PCP - General Family Medicine  12/23/18   William Hamburger, MD  Pediatrics  04/03/18    Comment: Maeola Harman, MD Consulting Physician Psychiatry  12/24/18     Patient Active Problem List   Diagnosis Date Noted  . Fatigue 05/27/2019  . Low back pain 02/24/2019  . Bilateral knee pain 02/24/2019  . Bilateral hip pain 02/24/2019  . Acute bilateral ankle pain 02/24/2019  . Healthcare maintenance 12/23/2018  . Generalized anxiety disorder 04/03/2018  . Attention deficit hyperactivity disorder  (ADHD), combined type, severe 04/03/2018  . Mild recurrent major depression (HCC) 04/03/2018  . Developmental coordination disorder 04/03/2018  . Allergy with anaphylaxis due to food 02/02/2016  . Allergic rhinitis due to pollen 02/02/2016     Past Medical History:  Diagnosis Date  . ADHD   . Allergy   . Anxiety   . Depression    reports ADHD      Past Surgical History:  Procedure Laterality Date  . NO PAST SURGERIES       Family History  Problem Relation Age of Onset  . Allergic rhinitis Mother   . Depression Mother   . Arthritis Mother   . Allergic rhinitis Father   . Allergic rhinitis Paternal Aunt   . Alcohol abuse Maternal Grandmother   . Depression Maternal Grandmother   . Alcohol abuse Maternal Grandfather   . Angioedema Neg Hx   . Asthma Neg Hx   . Eczema Neg Hx   . Immunodeficiency Neg Hx   . Urticaria Neg Hx      Social History   Substance and Sexual Activity  Drug Use Never     Social History   Substance and Sexual Activity  Alcohol Use Never  . Frequency: Never     Social History   Tobacco Use  Smoking Status Never Smoker  Smokeless Tobacco Never Used     Outpatient Encounter Medications as of 05/27/2019  Medication Sig  . ALPRAZolam (XANAX) 0.25 MG tablet Take 0.25 mg by mouth 2 (two) times daily as needed.  02/04/2016 amphetamine-dextroamphetamine (ADDERALL) 20 MG  tablet Take 1 tablet by mouth 2 (two) times daily.  Marland Kitchen EPINEPHrine 0.3 mg/0.3 mL IJ SOAJ injection INJECT 0.3 MLS (0.3 MG TOTAL) INTO THE MUSCLE ONCE.  Marland Kitchen FERROUS SULFATE PO Take 27 mg by mouth daily.  Marland Kitchen FLUoxetine (PROZAC) 20 MG capsule Take 1 capsule by mouth daily.  Marland Kitchen LARISSIA 0.1-20 MG-MCG tablet Take 1 tablet by mouth daily.  . Levocetirizine Dihydrochloride (XYZAL PO) Take 10 mg by mouth daily.  Marland Kitchen tiZANidine (ZANAFLEX) 4 MG tablet Take 1 tablet (4 mg total) by mouth at bedtime.  . [DISCONTINUED] amphetamine-dextroamphetamine (ADDERALL) 30 MG tablet Take 1 tablet by mouth 2  (two) times daily with breakfast and lunch.   No facility-administered encounter medications on file as of 05/27/2019.     Allergies: Other, Peanut-containing drug products, Acyclovir and related, and Augmentin [amoxicillin-pot clavulanate]  Body mass index is 20.06 kg/m.  Blood pressure 114/69, pulse (!) 110, temperature 99.4 F (37.4 C), temperature source Oral, height 5' 8.25" (1.734 m), weight 132 lb 14.4 oz (60.3 kg), last menstrual period 05/10/2019, SpO2 100 %. Review of Systems  Constitutional: Positive for activity change and fatigue. Negative for appetite change, chills, diaphoresis, fever and unexpected weight change.  Eyes: Negative for visual disturbance.  Respiratory: Negative for cough, chest tightness, shortness of breath, wheezing and stridor.   Cardiovascular: Negative for chest pain, palpitations and leg swelling.  Gastrointestinal: Negative for abdominal distention, abdominal pain, blood in stool and constipation.  Genitourinary: Negative for difficulty urinating, flank pain and hematuria.  Musculoskeletal: Positive for arthralgias, back pain, gait problem, myalgias, neck pain and neck stiffness. Negative for joint swelling.  Neurological: Negative for dizziness and headaches.  Hematological: Negative for adenopathy. Does not bruise/bleed easily.  Psychiatric/Behavioral: Negative for agitation, behavioral problems, confusion, decreased concentration, dysphoric mood, hallucinations, self-injury, sleep disturbance and suicidal ideas. The patient is not nervous/anxious and is not hyperactive.        Objective:   Physical Exam Vitals signs and nursing note reviewed.  Constitutional:      General: She is not in acute distress.    Appearance: She is normal weight. She is not ill-appearing or diaphoretic.  HENT:     Head: Normocephalic and atraumatic.  Eyes:     Extraocular Movements: Extraocular movements intact.     Conjunctiva/sclera: Conjunctivae normal.      Pupils: Pupils are equal, round, and reactive to light.  Cardiovascular:     Rate and Rhythm: Tachycardia present.     Pulses: Normal pulses.     Heart sounds: Normal heart sounds. No murmur. No friction rub. No gallop.   Pulmonary:     Effort: Pulmonary effort is normal. No respiratory distress.     Breath sounds: Normal breath sounds. No stridor. No wheezing or rales.  Chest:     Chest wall: No tenderness.  Musculoskeletal:        General: No tenderness.     Lumbar back: She exhibits spasm. She exhibits normal range of motion and no tenderness.     Right lower leg: No edema.     Left lower leg: No edema.     Comments: Bil neg straight leg raises, however some weakness with lifting legs noted  Skin:    Capillary Refill: Capillary refill takes less than 2 seconds.  Neurological:     Mental Status: She is alert and oriented to person, place, and time.  Psychiatric:        Mood and Affect: Mood normal.  Behavior: Behavior normal.        Thought Content: Thought content normal.        Judgment: Judgment normal.       Assessment & Plan:   1. Healthcare maintenance   2. Chronic bilateral low back pain with sciatica, sciatica laterality unspecified   3. Attention deficit hyperactivity disorder (ADHD), combined type, severe   4. Fatigue, unspecified type     Low back pain Continue with Physical Therapy as directed. Referral to Orthopedic Specialist placed to address on-going back pain. Tizanidine 4mg  as needed at bedtime. Stretch often, use heating pad, Epson Salt baths. If Orthopedic work-up negative will refer to Neurology Of note- grandfather has "some sort of muscle weakness disorder"  Attention deficit hyperactivity disorder (ADHD), combined type, severe Oct 2020-Adderall reduced from 30mg  to 20mg  PMDP reviewed- refills support this Fatigue sx's began around timing of reduction of stimulant   Fatigue Oct 2020-Adderall reduced from 30mg  to 20mg  PMDP reviewed-  refills support this Fatigue sx's began around timing of reduction of stimulant Non-fasting labs obtained today  Healthcare maintenance Continue with Physical Therapy as directed. Referral to Orthopedic Specialist placed to address on-going back pain. Tizanidine 4mg  as needed at bedtime. Stretch often, use heating pad, Epson Salt baths. We will contact when lab results are available. Continue with your mental health care team as directed. Fatigue may be linked to reducing Adderall dosage. Continue healthy eating and regular meals. Continue to social distance and wear a mask when in public. Follow-up in 3 months, sooner if needed.    FOLLOW-UP:  Return in about 3 months (around 08/25/2019) for Regular Follow Up, Fatigue.

## 2019-05-27 NOTE — Assessment & Plan Note (Signed)
Continue with Physical Therapy as directed. Referral to Orthopedic Specialist placed to address on-going back pain. Tizanidine 4mg  as needed at bedtime. Stretch often, use heating pad, Epson Salt baths. We will contact when lab results are available. Continue with your mental health care team as directed. Fatigue may be linked to reducing Adderall dosage. Continue healthy eating and regular meals. Continue to social distance and wear a mask when in public. Follow-up in 3 months, sooner if needed.

## 2019-05-27 NOTE — Assessment & Plan Note (Addendum)
Continue with Physical Therapy as directed. Referral to Orthopedic Specialist placed to address on-going back pain. Tizanidine 4mg  as needed at bedtime. Stretch often, use heating pad, Epson Salt baths. If Orthopedic work-up negative will refer to Neurology Of note- grandfather has "some sort of muscle weakness disorder"

## 2019-05-28 ENCOUNTER — Other Ambulatory Visit: Payer: Self-pay | Admitting: Adult Health

## 2019-05-28 ENCOUNTER — Encounter: Payer: Self-pay | Admitting: Adult Health

## 2019-05-28 DIAGNOSIS — E559 Vitamin D deficiency, unspecified: Secondary | ICD-10-CM

## 2019-05-28 DIAGNOSIS — F4312 Post-traumatic stress disorder, chronic: Secondary | ICD-10-CM | POA: Diagnosis not present

## 2019-05-28 DIAGNOSIS — F411 Generalized anxiety disorder: Secondary | ICD-10-CM | POA: Diagnosis not present

## 2019-05-28 DIAGNOSIS — F331 Major depressive disorder, recurrent, moderate: Secondary | ICD-10-CM | POA: Diagnosis not present

## 2019-05-28 DIAGNOSIS — F902 Attention-deficit hyperactivity disorder, combined type: Secondary | ICD-10-CM | POA: Diagnosis not present

## 2019-05-28 LAB — HEMOGLOBIN A1C
Est. average glucose Bld gHb Est-mCnc: 94 mg/dL
Hgb A1c MFr Bld: 4.9 % (ref 4.8–5.6)

## 2019-05-28 LAB — CBC WITH DIFFERENTIAL/PLATELET
Basophils Absolute: 0 10*3/uL (ref 0.0–0.2)
Basos: 1 %
EOS (ABSOLUTE): 0 10*3/uL (ref 0.0–0.4)
Eos: 0 %
Hematocrit: 38.6 % (ref 34.0–46.6)
Hemoglobin: 13.6 g/dL (ref 11.1–15.9)
Immature Grans (Abs): 0 10*3/uL (ref 0.0–0.1)
Immature Granulocytes: 0 %
Lymphocytes Absolute: 1.5 10*3/uL (ref 0.7–3.1)
Lymphs: 24 %
MCH: 34.1 pg — ABNORMAL HIGH (ref 26.6–33.0)
MCHC: 35.2 g/dL (ref 31.5–35.7)
MCV: 97 fL (ref 79–97)
Monocytes Absolute: 0.5 10*3/uL (ref 0.1–0.9)
Monocytes: 8 %
Neutrophils Absolute: 4.3 10*3/uL (ref 1.4–7.0)
Neutrophils: 67 %
Platelets: 231 10*3/uL (ref 150–450)
RBC: 3.99 x10E6/uL (ref 3.77–5.28)
RDW: 11 % — ABNORMAL LOW (ref 11.7–15.4)
WBC: 6.3 10*3/uL (ref 3.4–10.8)

## 2019-05-28 LAB — COMPREHENSIVE METABOLIC PANEL
ALT: 14 IU/L (ref 0–32)
AST: 18 IU/L (ref 0–40)
Albumin/Globulin Ratio: 1.9 (ref 1.2–2.2)
Albumin: 4.6 g/dL (ref 3.9–5.0)
Alkaline Phosphatase: 54 IU/L (ref 39–117)
BUN/Creatinine Ratio: 14 (ref 9–23)
BUN: 9 mg/dL (ref 6–20)
Bilirubin Total: 0.3 mg/dL (ref 0.0–1.2)
CO2: 24 mmol/L (ref 20–29)
Calcium: 9.2 mg/dL (ref 8.7–10.2)
Chloride: 105 mmol/L (ref 96–106)
Creatinine, Ser: 0.65 mg/dL (ref 0.57–1.00)
GFR calc Af Amer: 149 mL/min/{1.73_m2} (ref >59)
GFR calc non Af Amer: 129 mL/min/{1.73_m2} (ref >59)
Globulin, Total: 2.4 g/dL (ref 1.5–4.5)
Glucose: 101 mg/dL — ABNORMAL HIGH (ref 65–99)
Potassium: 4.1 mmol/L (ref 3.5–5.2)
Sodium: 142 mmol/L (ref 134–144)
Total Protein: 7 g/dL (ref 6.0–8.5)

## 2019-05-28 LAB — TSH: TSH: 1.98 u[IU]/mL (ref 0.450–4.500)

## 2019-05-28 LAB — LDL CHOLESTEROL, DIRECT: LDL Direct: 74 mg/dL (ref 0–109)

## 2019-05-28 LAB — VITAMIN D 25 HYDROXY (VIT D DEFICIENCY, FRACTURES): Vit D, 25-Hydroxy: 15.1 ng/mL — ABNORMAL LOW (ref 30.0–100.0)

## 2019-05-28 MED ORDER — VITAMIN D (ERGOCALCIFEROL) 1.25 MG (50000 UNIT) PO CAPS: 50000.0000 [IU] | ORAL_CAPSULE | ORAL | 0 refills | Status: DC

## 2019-06-01 DIAGNOSIS — Z20828 Contact with and (suspected) exposure to other viral communicable diseases: Secondary | ICD-10-CM | POA: Diagnosis not present

## 2019-06-03 ENCOUNTER — Other Ambulatory Visit: Payer: Self-pay | Admitting: Adult Health

## 2019-06-03 NOTE — Telephone Encounter (Signed)
You prescribed this medication on 05/27/2019 for 15 tablets only.  Please review for appropriateness of refill and authorize if appropriate.  Charyl Bigger, CMA

## 2019-06-04 DIAGNOSIS — F331 Major depressive disorder, recurrent, moderate: Secondary | ICD-10-CM | POA: Diagnosis not present

## 2019-06-04 DIAGNOSIS — F411 Generalized anxiety disorder: Secondary | ICD-10-CM | POA: Diagnosis not present

## 2019-06-04 DIAGNOSIS — F4312 Post-traumatic stress disorder, chronic: Secondary | ICD-10-CM | POA: Diagnosis not present

## 2019-06-04 DIAGNOSIS — F902 Attention-deficit hyperactivity disorder, combined type: Secondary | ICD-10-CM | POA: Diagnosis not present

## 2019-06-07 ENCOUNTER — Other Ambulatory Visit: Payer: Self-pay | Admitting: Adult Health

## 2019-06-09 ENCOUNTER — Encounter: Payer: Self-pay | Admitting: Registered"

## 2019-06-09 ENCOUNTER — Ambulatory Visit (INDEPENDENT_AMBULATORY_CARE_PROVIDER_SITE_OTHER): Payer: BC Managed Care – PPO | Admitting: Family Medicine

## 2019-06-09 ENCOUNTER — Ambulatory Visit: Payer: Self-pay

## 2019-06-09 ENCOUNTER — Encounter: Payer: Self-pay | Admitting: Family Medicine

## 2019-06-09 ENCOUNTER — Other Ambulatory Visit: Payer: Self-pay

## 2019-06-09 ENCOUNTER — Encounter: Payer: BC Managed Care – PPO | Attending: Adult Health | Admitting: Registered"

## 2019-06-09 DIAGNOSIS — M545 Low back pain, unspecified: Secondary | ICD-10-CM

## 2019-06-09 DIAGNOSIS — G8929 Other chronic pain: Secondary | ICD-10-CM

## 2019-06-09 DIAGNOSIS — Z713 Dietary counseling and surveillance: Secondary | ICD-10-CM

## 2019-06-09 NOTE — Progress Notes (Signed)
Medical Nutrition Therapy:  Appt start time: 4401 end time:  1740.   Follow-up Assessment:  Primary concerns today: return for meal planning to ensure getting enough nutrients and calories.   Pt states her goal is to maintain or maybe gain a little more and then maintain her weight and make sure she is not missing some essential nutrient that would cause a vitamin deficiency and ill health.  Filed Weights   06/09/19 1927  Weight: 132 lb 1.6 oz (59.9 kg)  Weight 02/2019 was 120 lbs.  Pt states she states her weight got down to 115 lb, believes due to several factors including food insecurity and loss of appetite from Adderall dose. Pt states she was never hungry at the higher dose and the only way she knew she needed to eat was extreme fatigue and feeling "delusional." Pt states her stomach felt as if she ate a large Thanksgiving meal and the thought of having to eat made her feel like she was being asked to force 4 more servings of food.   Pt also reported history of constipation, as a child sometimes would have 1 BM/week. Pt states she averages a BM every other day. Encouraged to continue eating vegetables and slowly start to incorporate more beans.  Preferred Learning Style:   No preference indicated   Learning Readiness:   Change in progress   MEDICATIONS: reviewed   DIETARY INTAKE:  Usual eating pattern includes 3 meals and 0-1 snacks per day.  Everyday foods include chicken.  Avoided foods include nuts, soy milk, cow's milk (yogurt and feta cheese okay) raw fruits.    24-hr recall:  B ( AM): oatmeal Or home cooked breakfast biscuits with sausage, egg & bell pepper Snk ( AM): none  L ( PM): left over dinner OR cucumbers, crackers, humus and Greek yogurt. Snk ( PM):  D ( PM): 4-5 oz chicken, 1 c rice, 2 c vegetables Snk ( PM): Beverages: water, occasional sweet tea if offered at someone's house  Usual physical activity: Yoga  Estimated energy needs: ~2000 calories  (light/moderate activity)  Progress Towards Goal(s):  Some progress.   Nutritional Diagnosis:  [place]-3.2 Unintentional weight loss As related to loss of appetite, food allergies & intollerances and limited acceptance of foods.  As evidenced by dietary recall, stated food preferences, weight loss (but in process of regaining).    Intervention:  Nutrition Education. Discussed food groups and amounts of each group to meet nutritional needs  Plan: Full-fat Cream cheese with raspberry jam on bread with egg on side. Seasoning vegetables with butter or other fat source, guacamole and humus are also great to continue including.   To make sure you are getting enough calcium  2 servings: Full fat Greek yogurt for snack., include cheese when possible, include green leafy vegetables such as spinach   Teaching Method Utilized:  Visual Auditory Hands on  Handouts given during visit include:  MyPlate MyWins 0,272 calorie  Barriers to learning/adherence to lifestyle change: potential food insecurity and other social determinants of health. (being followed by a counselor)  Demonstrated degree of understanding via:  Teach Back   Monitoring/Evaluation:  Dietary intake, exercise, and body weight prn.

## 2019-06-09 NOTE — Progress Notes (Signed)
Charmane Protzman - 18 y.o. female MRN [id number]  Date of birth: Jun 08, 2001  Office Visit Note: Visit Date: 06/09/2019 PCP: Esaw Grandchild, NP Referred by: Esaw Grandchild, NP  Subjective: Chief Complaint  Patient presents with  . Lower Back - Pain    Most of the pain is in the lower back. Some pain around the shoulders. Pain in both knees. Level of pain is a 7/8 once a week since August of this year.   HPI: Nakima Fluegge is a 18 y.o. female who comes in today with lower chronic back pain and bilateral knee pain.  She reports that she has had lower back pain since she was in middle school (~8 years). No acute injuries. No sports in high school. She has been to PT in 2017 and again recently in 2020, which improved pain some. She has been told that she is very weak, especially in hip muscles. She has been doing PT exercises, yoga, and running which helps with pain. She works at [employer] and starts to feel pain towards the end of her shifts.   She also reports bilateral knee pain. Anterior pain- around patella. No acute injuries. Working with PT helps the pain.  She has had issues with GI tract since she was younger- chronic constipation. Very sensitive to different foods and reports that she is now eating about 20 foods. Cannot eat fresh fruits or veggies without have a reaction. Only meat is chicken. Very sensitive to sweet or greasy food.    ROS Otherwise per HPI.  Assessment & Plan: Visit Diagnoses:  1. Chronic bilateral low back pain without sciatica     Plan: Lower back pain is likely multifactorial. She has general weakness, mild thoracic scoliosis. Given minimum improvement with physical therapy, will obtain MRI to ensure no structural pathology. She has vitamin D deficiency which can contribute to pain. She also has chronic gut problems that could contribute to poor nutrition absorption. If MRI normal, will try food elimination diet. Either way, will optimize vitamin D with  continued weekly dosing prescribed by her PCP.  Meds & Orders: No orders of the defined types were placed in this encounter.   Orders Placed This Encounter  Procedures  . XR Lumbar Spine Complete  . MR Lumbar Spine w/o contrast    Follow-up: No follow-ups on file.   Procedures: none  Clinical History: No specialty comments available.   She reports that she has never smoked. She has never used smokeless tobacco.  Recent Labs    05/27/19 1344  HGBA1C 4.9    Objective:  VS:  HT:    WT:   BMI:     BP:   HR: bpm  TEMP: ( )  RESP:  Physical Exam  PHYSICAL EXAM: Gen: NAD, alert, cooperative with exam, well-appearing, thin female HEENT: clear conjunctiva,  CV:  no edema, capillary refill brisk, normal rate Resp: non-labored Skin: no rashes, normal turgor  Neuro: no gross deficits.  Psych:  alert and oriented  Ortho Exam  Spine: - Inspection: mild rotation of thoracic spine with R shoulder blade higher than left  - Palpation: mild TTP over L3-L5 spinous processes, no pain over paraspinal muscles, or SI joints b/l - ROM: full lumbar flexion with palms to floor, minimal extension  - Strength: 5/5 strength of lower extremity in L4-S1 nerve root distributions b/l; normal gait - Neuro: sensation intact in the L4-S1 nerve root distribution b/l, 2+ L4 and S1 reflexes - Special testing: Negative  straight leg raise, negative Stork test  Hip abduction relatively weak bilaterally Strong hip adduction  Bilateral knees: - Inspection: no swelling, gross deformity - Palpation: TTP in anterior knees bilaterally - ROM: full active ROM with flexion and extension in knee and hip - Strength: 5/5 strength - Neuro/vasc: NV intact - Special Tests: - LIGAMENTS: negative anterior and posterior drawer, no MCL or LCL laxity  -- MENISCUS: negative McMurray's -- PF JOINT: nml patellar mobility bilaterally.   Imaging: Mild thoracic scoliosis with curve to right  Past  Medical/Family/Surgical/Social History: Medications & Allergies reviewed per EMR, new medications updated. Patient Active Problem List   Diagnosis Date Noted  . Fatigue 05/27/2019  . Low back pain 02/24/2019  . Bilateral knee pain 02/24/2019  . Bilateral hip pain 02/24/2019  . Acute bilateral ankle pain 02/24/2019  . Healthcare maintenance 12/23/2018  . Generalized anxiety disorder 04/03/2018  . Attention deficit hyperactivity disorder (ADHD), combined type, severe 04/03/2018  . Mild recurrent major depression (HCC) 04/03/2018  . Developmental coordination disorder 04/03/2018  . Allergy with anaphylaxis due to food 02/02/2016  . Allergic rhinitis due to pollen 02/02/2016   Past Medical History:  Diagnosis Date  . ADHD   . Allergy   . Anxiety   . Depression    reports ADHD    Family History  Problem Relation Age of Onset  . Allergic rhinitis Mother   . Depression Mother   . Arthritis Mother   . Allergic rhinitis Father   . Allergic rhinitis Paternal Aunt   . Alcohol abuse Maternal Grandmother   . Depression Maternal Grandmother   . Alcohol abuse Maternal Grandfather   . Angioedema Neg Hx   . Asthma Neg Hx   . Eczema Neg Hx   . Immunodeficiency Neg Hx   . Urticaria Neg Hx    Past Surgical History:  Procedure Laterality Date  . NO PAST SURGERIES     Social History   Occupational History  . Not on file  Tobacco Use  . Smoking status: Never Smoker  . Smokeless tobacco: Never Used  Substance and Sexual Activity  . Alcohol use: Never  . Drug use: Never  . Sexual activity: Yes    Birth control/protection: Condom, Pill

## 2019-06-09 NOTE — Progress Notes (Signed)
I saw and examined the patient with Dr. Mayer Masker and agree with assessment and plan as outlined.    Chronic LBP with mild scoliosis on x-ray.  Has done PT and home exercises.  Recent vitamin D severely low at 15 (goal level 50-80).  Could be contributing to pain.  Also chronic gut issues with multiple food allergies.  Could be causing malabsorption, and could be contributing to pain.    Will order MRI.  If negative, will try a food elimination diet and will work on optimizing vitamin D level.  Consider chiropractic referral.

## 2019-06-09 NOTE — Patient Instructions (Addendum)
Full-fat Cream cheese with raspberry jam on bread with egg on side. Seasoning vegetables with butter or other fat source, guacamole and humus are also great to continue including.   To make sure you are getting enough calcium  2 servings: Full fat Greek yogurt for snack., include cheese when possible, include green leafy vegetables such as spinach

## 2019-06-09 NOTE — Telephone Encounter (Signed)
I am unsure if refill is appropriate.  Please review and refill if appropriate.  T. Lakai Moree, CMA 

## 2019-06-16 NOTE — Progress Notes (Signed)
Received Epic notification that pt has not read MyChart message regarding results.     Recent Lab Results  From  Esaw Grandchild, NP To  Eric Form Sent and Delivered  05/28/2019 9:27 AM  Good Morning Ms. Duggar,  Your metabolic panel and complete blood counts are both stable.  A1c (average of your blood sugar over the last 3 months) is low normal- recommend that you eat every few hours to keep blood sugar level stable and prevent hypoglycemia episodes.  Your thyroid level is normal.  Your Direct LDL (cholesterol level) is great- 74  Your Vit D level is quite low-15.1 (normal 30-100)- I sent in Rx Vit D- take that once a week, also recommend that you take a once daily OTC Vit D 2,000 IU daily.  Will need to re-check Vit D level in 4 months.  This can contribute to fatigue.  Sincerely,  [employer] User Last Read On  [place] Not Read      Letter mailed to pt.  Charyl Bigger, CMA

## 2019-06-18 ENCOUNTER — Other Ambulatory Visit: Payer: Self-pay | Admitting: Adult Health

## 2019-06-18 DIAGNOSIS — B373 Candidiasis of vulva and vagina: Secondary | ICD-10-CM | POA: Diagnosis not present

## 2019-06-18 DIAGNOSIS — F902 Attention-deficit hyperactivity disorder, combined type: Secondary | ICD-10-CM | POA: Diagnosis not present

## 2019-06-18 DIAGNOSIS — F411 Generalized anxiety disorder: Secondary | ICD-10-CM | POA: Diagnosis not present

## 2019-06-18 DIAGNOSIS — F4312 Post-traumatic stress disorder, chronic: Secondary | ICD-10-CM | POA: Diagnosis not present

## 2019-06-18 DIAGNOSIS — F331 Major depressive disorder, recurrent, moderate: Secondary | ICD-10-CM | POA: Diagnosis not present

## 2019-06-18 MED ORDER — TIZANIDINE HCL 4 MG PO TABS: 4.0000 mg | ORAL_TABLET | Freq: Every day | ORAL | 0 refills | Status: DC

## 2019-06-18 NOTE — Telephone Encounter (Signed)
Please review refill request and refill if appropriate.  T. Nashali Ditmer, CMA  

## 2019-06-18 NOTE — Telephone Encounter (Signed)
Patient called stating Cassandra Schultz had given her muscle relaxers and is requesting an additional refill. She states [facility] and the [facility] was denied, she wants to see if that was in error. She just finished moving to a new house and has a full work day tomorrow that she states she would feel better if she could get this renewed.

## 2019-06-18 NOTE — Addendum Note (Signed)
Addended by: Fonnie Mu on: 06/18/2019 02:16 PM   Modules accepted: Orders

## 2019-06-18 NOTE — Telephone Encounter (Signed)
NO FURTHER REFILLS

## 2019-06-25 ENCOUNTER — Other Ambulatory Visit: Payer: Self-pay | Admitting: Adult Health

## 2019-06-25 DIAGNOSIS — F331 Major depressive disorder, recurrent, moderate: Secondary | ICD-10-CM | POA: Diagnosis not present

## 2019-06-25 DIAGNOSIS — F411 Generalized anxiety disorder: Secondary | ICD-10-CM | POA: Diagnosis not present

## 2019-06-25 DIAGNOSIS — F902 Attention-deficit hyperactivity disorder, combined type: Secondary | ICD-10-CM | POA: Diagnosis not present

## 2019-06-25 DIAGNOSIS — F4312 Post-traumatic stress disorder, chronic: Secondary | ICD-10-CM | POA: Diagnosis not present

## 2019-06-28 ENCOUNTER — Other Ambulatory Visit: Payer: BC Managed Care – PPO

## 2019-07-02 DIAGNOSIS — F411 Generalized anxiety disorder: Secondary | ICD-10-CM | POA: Diagnosis not present

## 2019-07-02 DIAGNOSIS — F902 Attention-deficit hyperactivity disorder, combined type: Secondary | ICD-10-CM | POA: Diagnosis not present

## 2019-07-02 DIAGNOSIS — F331 Major depressive disorder, recurrent, moderate: Secondary | ICD-10-CM | POA: Diagnosis not present

## 2019-07-02 DIAGNOSIS — F4312 Post-traumatic stress disorder, chronic: Secondary | ICD-10-CM | POA: Diagnosis not present

## 2019-07-03 ENCOUNTER — Telehealth: Payer: Self-pay | Admitting: Adult Health

## 2019-07-03 NOTE — Telephone Encounter (Signed)
Patient called states her pyshotherapist ask if some special types of lab test were ever drawn for evaluation (ANA), DS (DNA) & SED ?   --Advised pt her message would be forwarded to nurse / provider for review & someone would call her back on Next week staff out of office on Fridays @ 12noon.  --glh

## 2019-07-05 ENCOUNTER — Other Ambulatory Visit: Payer: BC Managed Care – PPO

## 2019-07-06 NOTE — Telephone Encounter (Signed)
Pt advised that the only "specialized" tests that she has received an RA and Von Willebrand tests.  Pt states that her PGF had MD and wondered if she should have any testing for this or if this a heredity condition that she should be concerned about developing in the future.  Please advise.  Tiajuana Amass, CMA

## 2019-07-06 NOTE — Telephone Encounter (Signed)
07/06/2019  LVM for pt to call to discuss.  Tiajuana Amass, CMA

## 2019-07-06 NOTE — Telephone Encounter (Signed)
Good Morning [name], Can you please call Cassandra Schultz and have her schedule TeleMed to discuss her concerns and poss labs. Thanks! Orpha Bur

## 2019-07-09 DIAGNOSIS — F331 Major depressive disorder, recurrent, moderate: Secondary | ICD-10-CM | POA: Diagnosis not present

## 2019-07-09 DIAGNOSIS — F4312 Post-traumatic stress disorder, chronic: Secondary | ICD-10-CM | POA: Diagnosis not present

## 2019-07-09 DIAGNOSIS — F902 Attention-deficit hyperactivity disorder, combined type: Secondary | ICD-10-CM | POA: Diagnosis not present

## 2019-07-09 DIAGNOSIS — F411 Generalized anxiety disorder: Secondary | ICD-10-CM | POA: Diagnosis not present

## 2019-07-16 ENCOUNTER — Other Ambulatory Visit: Payer: Self-pay

## 2019-07-16 ENCOUNTER — Ambulatory Visit
Admission: RE | Admit: 2019-07-16 | Discharge: 2019-07-16 | Disposition: A | Discharge status: Home / Self Care | Payer: BC Managed Care – PPO | Source: Ambulatory Visit | Attending: Family Medicine | Admitting: Family Medicine

## 2019-07-16 ENCOUNTER — Telehealth: Payer: Self-pay | Admitting: Family Medicine

## 2019-07-16 DIAGNOSIS — G8929 Other chronic pain: Secondary | ICD-10-CM

## 2019-07-16 DIAGNOSIS — M545 Low back pain: Secondary | ICD-10-CM | POA: Diagnosis not present

## 2019-07-16 NOTE — Telephone Encounter (Signed)
MRI looks normal

## 2019-07-23 IMAGING — CR DG CHEST 2V
2 series · 2 of 2 positions shown · non-contrast
Comparison: None.

CLINICAL DATA: Decreased breath sounds.

EXAM:
CHEST - 2 VIEW

[w chest pa]
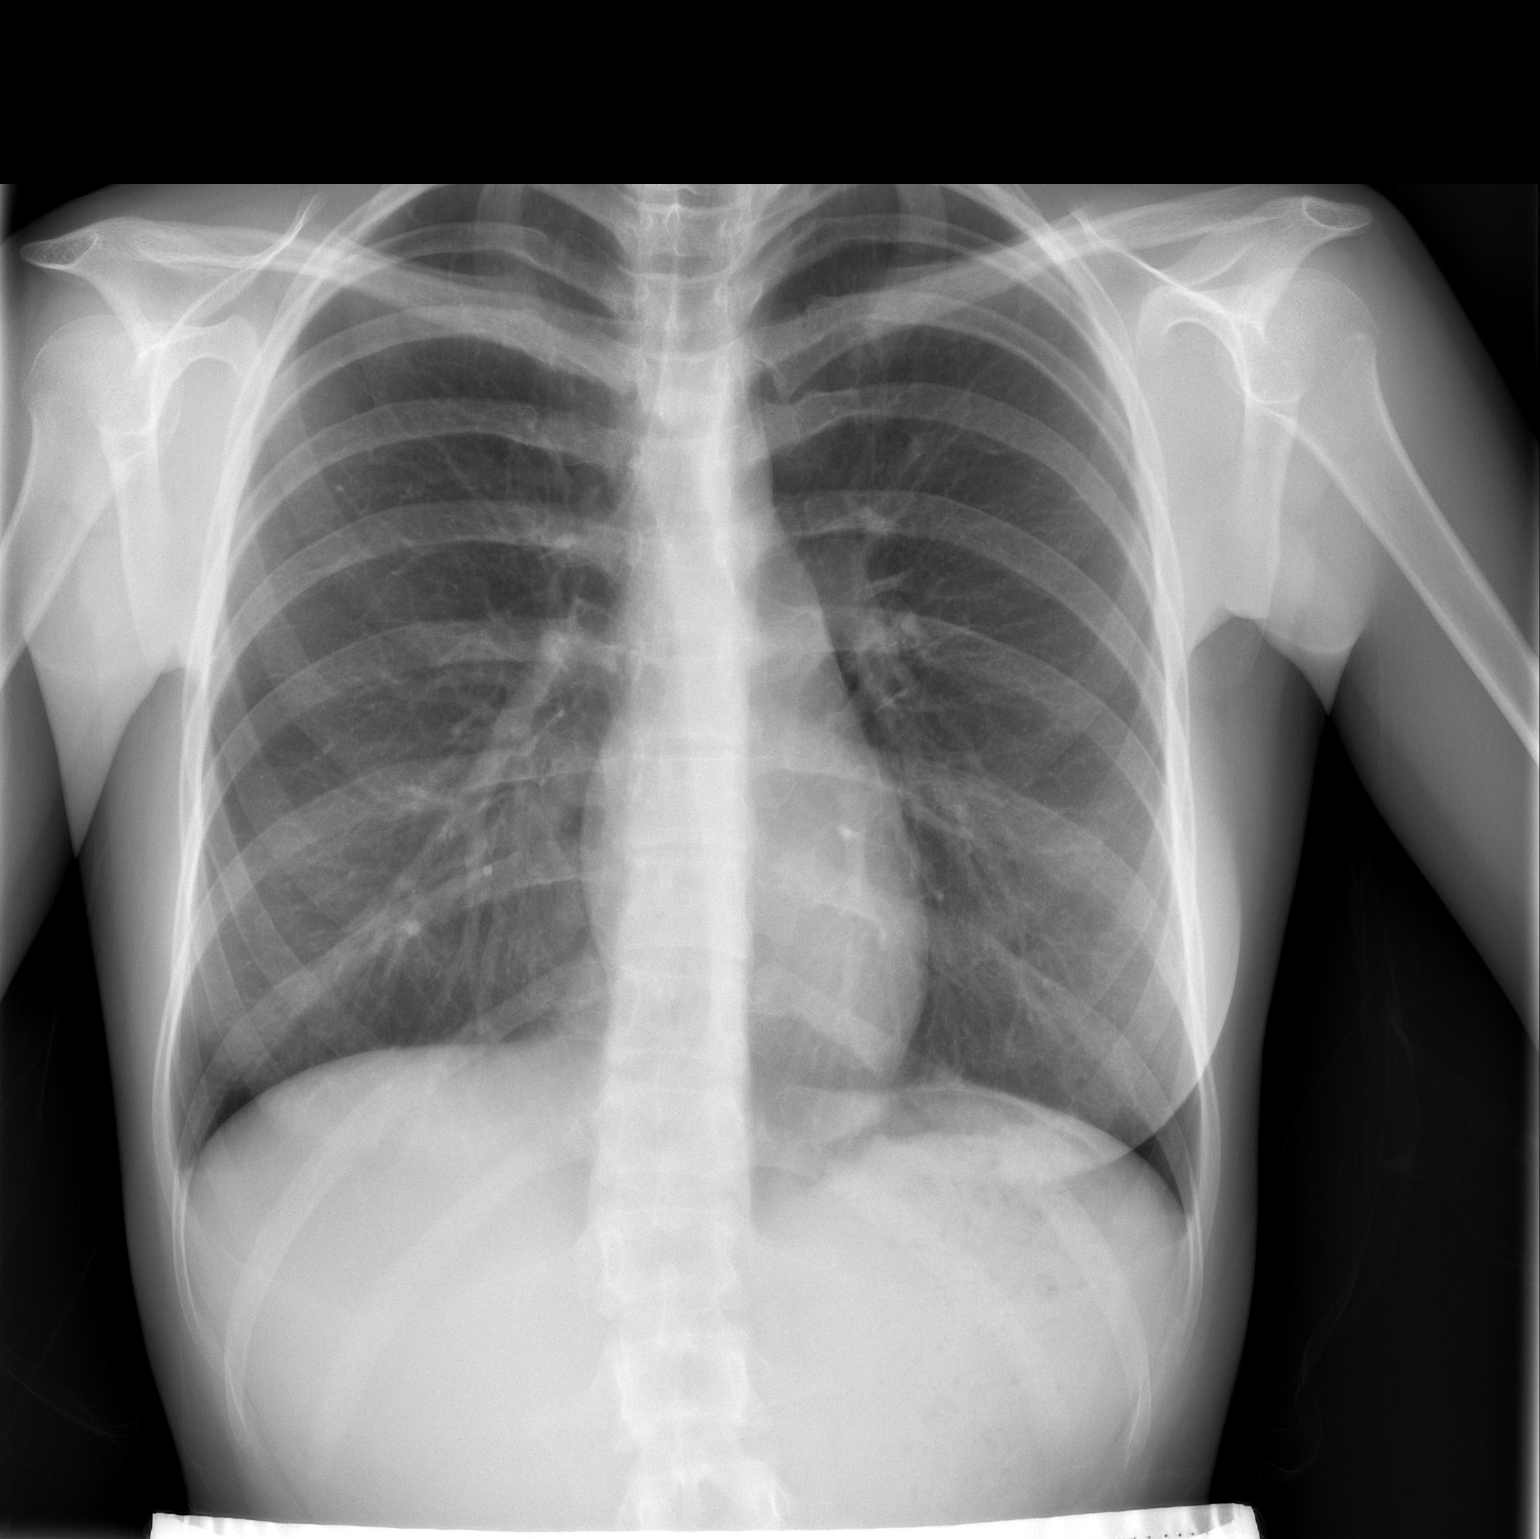

[w chest lat]
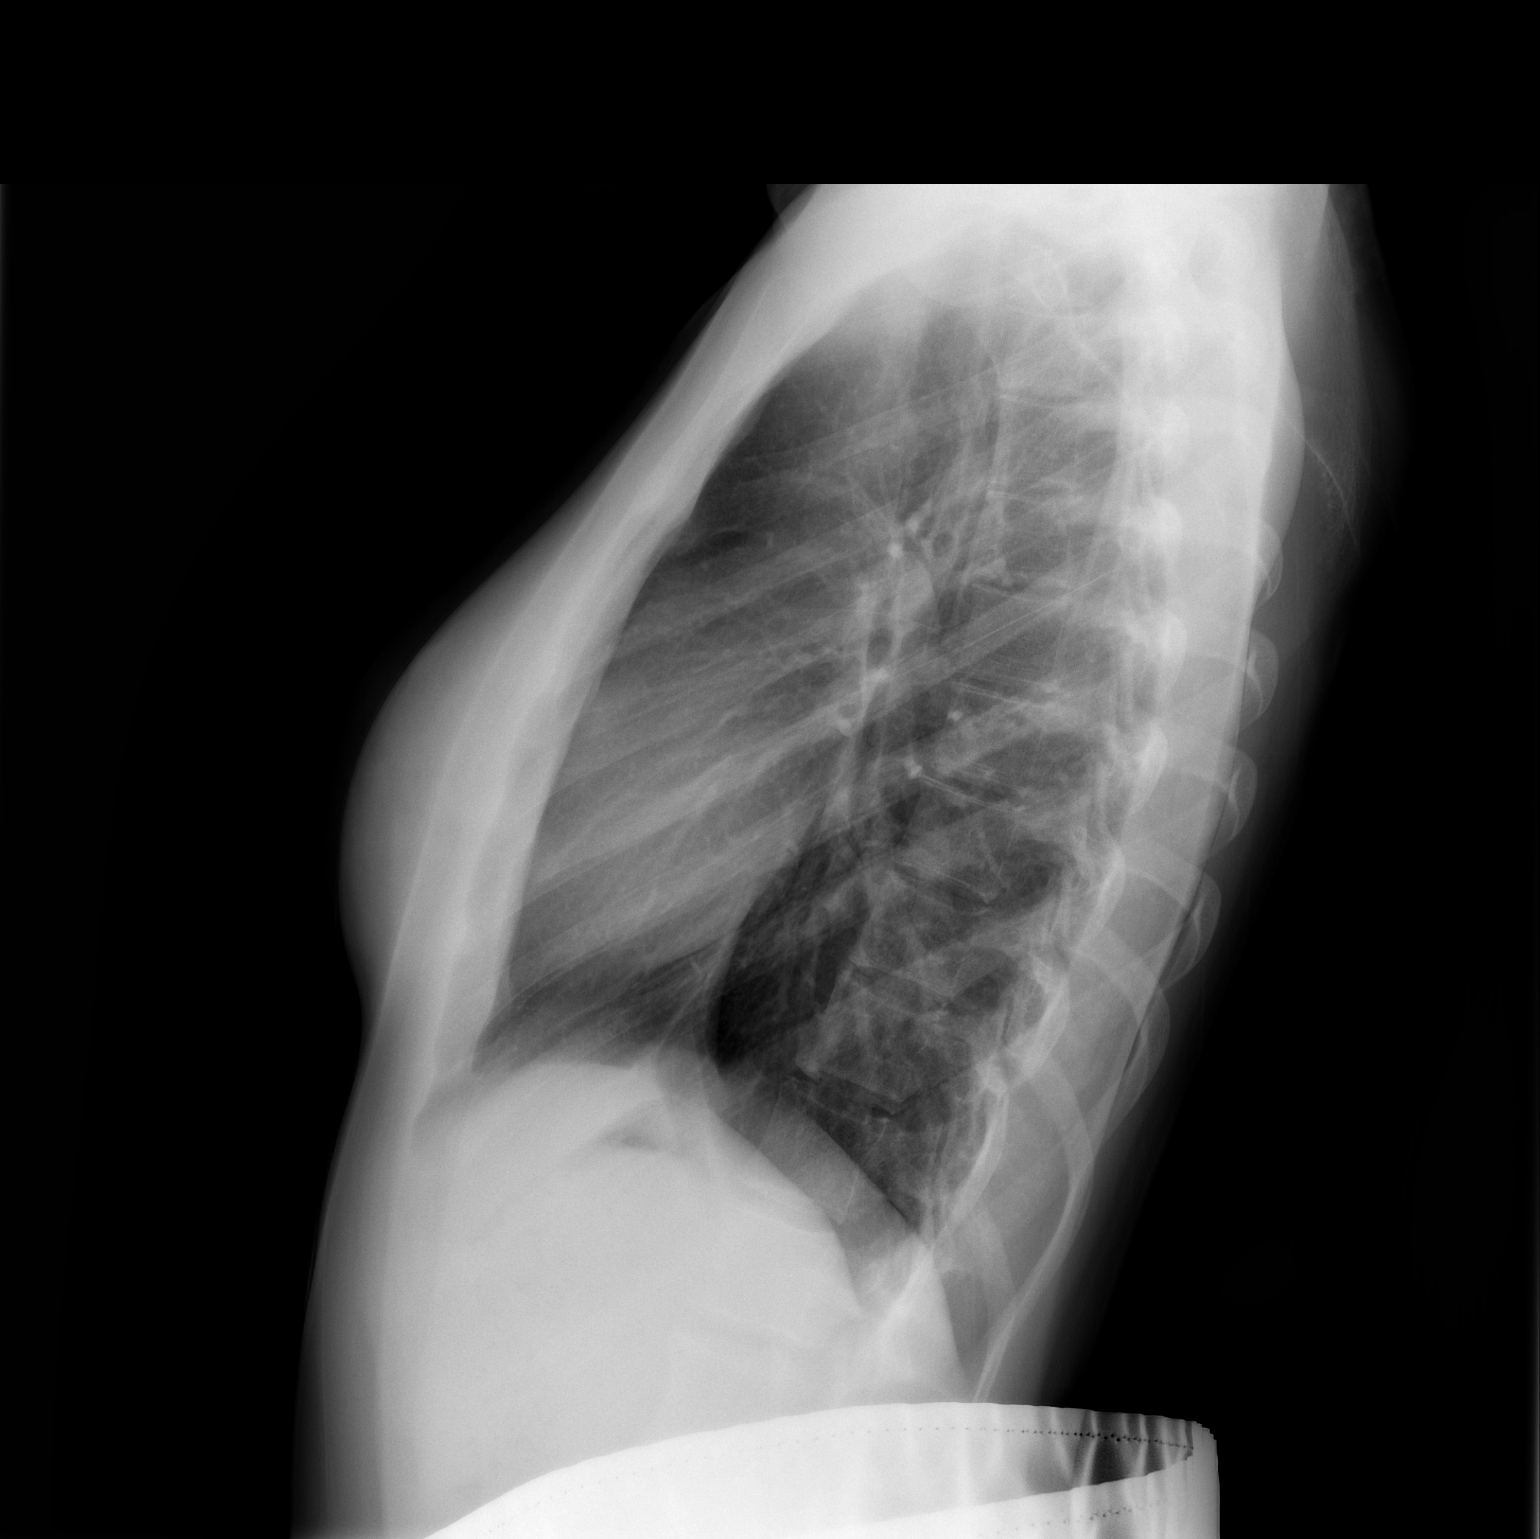

[2 of 2 positions shown; findings below may reference images not displayed]

FINDINGS: The heart size and mediastinal contours are within normal limits.
Both lungs are clear. No pneumothorax or pleural effusion is noted.
The visualized skeletal structures are unremarkable.
IMPRESSION: No active cardiopulmonary disease.

## 2019-08-03 ENCOUNTER — Ambulatory Visit (INDEPENDENT_AMBULATORY_CARE_PROVIDER_SITE_OTHER): Payer: BC Managed Care – PPO | Admitting: Family Medicine

## 2019-08-03 ENCOUNTER — Encounter: Payer: Self-pay | Admitting: Family Medicine

## 2019-08-03 ENCOUNTER — Other Ambulatory Visit: Payer: Self-pay

## 2019-08-03 VITALS — BP 103/65 | HR 96 | Temp 98.3°F | Resp 12 | Ht 68.25 in | Wt 137.5 lb

## 2019-08-03 DIAGNOSIS — R200 Anesthesia of skin: Secondary | ICD-10-CM

## 2019-08-03 DIAGNOSIS — M545 Low back pain, unspecified: Secondary | ICD-10-CM

## 2019-08-03 DIAGNOSIS — F902 Attention-deficit hyperactivity disorder, combined type: Secondary | ICD-10-CM | POA: Diagnosis not present

## 2019-08-03 DIAGNOSIS — R202 Paresthesia of skin: Secondary | ICD-10-CM

## 2019-08-03 DIAGNOSIS — G8929 Other chronic pain: Secondary | ICD-10-CM

## 2019-08-03 DIAGNOSIS — F411 Generalized anxiety disorder: Secondary | ICD-10-CM

## 2019-08-03 NOTE — Patient Instructions (Signed)
Please let us know who your brother's neurologist is in [place] and we will send you there for your history of back pain as well as bilateral lower extremity pain and numbness

## 2019-08-03 NOTE — Progress Notes (Signed)
Impression and Recommendations:    1. Chronic bilateral low back pain without sciatica   2. Numbness and tingling of both legs - comes and goes after activity and onset of pain   3. Attention deficit hyperactivity disorder (ADHD), combined type, severe   4. Generalized anxiety disorder      ADHD, combined type, severe - Treatment plan managed by psychiatrist.    - Continue treatment plan as established. - Will continue to monitor.   Chronic Bilateral Low Back Pain w/out Sciatica, Numbness & Tingling of Both Legs - Patient was referred to Orthopedics, and was lost to follow-up. -Encourage patient to follow-up with Dr. Prince Rome at [facility] if she has continued back pain.     - Discussed patient's symptoms at length during appointment today. - Extensive education provided and all questions answered.  - For numbness and some weakness/easily fatigued muscles in her bilateral legs discussed need for referral to Neurology.  Especially with her family history  - In addition, for diffuse joint pain, discussed option of referral to Rheumatology in future if needed to look for autoimmune disorder. .  - Encouraged patient to speak with her brother or mom/dad to find out which specialist her brother sees, and establish with this specialist herself.  -  Ambulatory referral to neurology placed today.  See orders.  - Discussed prudent use of muscle relaxers with patient today. - Advised patient that these are meant for short-term use and not chronic management. - As xanax was last given to patient January 1, no refill provided today. - Encouraged patient to follow up with neurologist.  - Advised patient to follow up and continue care with all specialists as referred and established.  - Will continue to monitor.  Generalized Anxiety Disorder - Discussed psychosomatic symptoms of worry and anxiety alongside the possibility of neurological/ rheumatological  conditions which must be excluded.  Encouraged patient to speak with her therapist regarding strategies to cope with possible psychosomatic symptoms.  - In addition to therapy/counseling, reviewed the "spokes of the wheel" of mood and health management.  Stressed the importance of ongoing prudent habits, including regular exercise, appropriate sleep hygiene, healthful dietary habits, and prayer/meditation to relax.  - Discussed critical importance of conservative therapy of symptoms including ongoing exercise, prudent diet, and regular sleep.   - Health counseling performed.  All questions answered.  - Will continue to monitor.  Patient was interviewed and evaluated by me/ [facility] staff members in the clinic today  for 45+ minutes, with over 50% of my time spent in face to face counseling of patients various medical conditions, treatment plans of those medical conditions including medicine management and lifestyle modification, strategies to improve health and well being; and in coordination of care.   SEE TREATMENT PLAN FOR DETAILS   Orders Placed This Encounter  Procedures  . Ambulatory referral to Neurology     Expresses verbal understanding and consents to current therapy and treatment regimen.  No barriers to understanding were identified.  Red flag symptoms and signs discussed in detail.  Patient expressed understanding regarding what to do in case of emergency\urgent symptoms   - Also, encouraged patient to come with 1 main problem to discuss next office visit in hopes to keep [place] on track more.  Explained to pt that we cannot continue to spend over together at future visits   Patient does have flights of ideas and concerns for paranoia however, this is my first time  meeting patient today hence, if these attributes continue I will encourage patient to follow-up with her psychiatrist to address in the future.  Her Psychiatrist is Dr. Toy Care.  Please see AVS handed out to patient at the  end of our visit for further patient instructions/ counseling done pertaining to today's office visit.   Return for f/up 3-89mo AFTER specialist visits .     Note:  This note was prepared with assistance of Dragon voice recognition software. Occasional wrong-word or sound-a-like substitutions may have occurred due to the inherent limitations of voice recognition software.   This document serves as a record of services personally performed by Mellody Dance, DO. It was created on her behalf by Toni Amend, a trained medical scribe. The creation of this record is based on the scribe's personal observations and the provider's statements to them.   This case required medical decision making of at least moderate complexity.  This case required medical decision making of at least moderate complexity. The above documentation from Toni Amend, medical scribe, has been reviewed by Marjory Sneddon, D.O.   --------------------------------------------------------------------------------------------------------------------------------------------------------------------------------------------------------------------------------------------    Subjective:     Phillips Odor, am serving as scribe for Dr.Manpreet Kemmer.  HPI: Lanetra Hartley is a 19 y.o. female who presents to [place] [facility] today for issues as discussed below.  My main concern is my back pain.  Patient thinks her other pain stems from her back and her back is the place that starts hurting first.  Believes her pain and fatigue are the symptoms that most interfere with her life.  Notes "mostly in my lower back, and after that, the biggest place is in my knees, and then the third place is my hips and thighs. But I hurt all over."    Patient notes that Valetta Fuller recently sent her to Dr. Junius Roads at [place] [facility], where an MRI was ordered for  further evaluation of her back and various joint pains.   Per patient, the MRI came back normal, and was told by Dr. Junius Roads her sports medicine specialist that: "MRI looks normal, which is great.  I will email some info on doing an "elimination diet".  I think that along with optimizing vitamin D levels, this may get rid of your symptoms"  All of these test results and specialty office visit notes etc. I reviewed today.   The patient is concerned about this because she has "just gotten to a point of eating better."   Says she's a really picky eater and typically only eats chicken and vegetables.  In addition to me reviewing her MRI with her today, notes she has a history of autoimmune disorders in her family, and wanted to update the clinic about this. (I asked her to please review this history with the CMA today so this can be added to her medical records!!)     + family h/o Graves Disease-  maternal grandmother has hypothyroidism-  both of her aunts have hypothyroidism.     Both of her aunts have liver-based autoimmune disorders, but the patient doesn't remember the name/diagnosis.  Her brother has CIPD, and is in his thirties ( "chronic inflammatory polyneuropathy disease").  He also has Guillain-barre syndrome.   Her dad's dad has some type of muscular dystrophy / neuro-muscular disorder.   Says she knew her family had a lot of health issues, but was unsure exactly what they were until recently.  Says "I'm pretty sure I have Raynaud's syndrome, because if there's air conditioning in the room, my feet turn purple and "almost kinda red."  States "they've done that since I was little."  Says she's also always bruised really easily.  She has never been to rheumatology before.  Thinks that her pediatrician might have sent her to a hematologist in the past.  She admits she has anxiety disorder and does often worry excessively about her health.  The patient saw no specialists until this past October, and  now has / sees a PCP, an Chief Financial Officer, an orthopedist, a psychiatrist- Dr Evelene Croon and an Proofreader.    -->She sees an allergist once every few years, last to get re-tested and take allergy shots.  She also has a therapist helping her manage eating and sleeping and helping her keep up with her doctor's appointments.  She has been to a physical therapist her junior year for three months, and recently ortho-spine.  She was in physical therapy in October of 2020 for her back, knees, hips, ankles, and thighs.  Says that physical therapy helped with her symptoms "somewhat."    --> Notes after every shift, she can't walk, she limps, and can't stand after working for four hours.  Says she experiences pain and then her legs get numb.  Notes "sciatica on and off."  For treatment of her symptoms, she was told to keep trying to exercise and do yoga, alternate between tylenol and ibuprofen, and muscle relaxers prescribed to take at night.  Notes that the muscle relaxers are mildly helpful.  Says that rotating tylenol and Advil "can sometimes postpone it a little bit, but it really doesn't help."  After being sent to Orthopedics, patient notes that if that all came back normal, she was going to be sent to Neurology.  States she does believe that her symptoms are located more in her joints rather than her muscles.  Notes she has had back and knee pain since she was in elementary school, and says the parents she grew up with never believed she was in pain and never took her to a doctor for this.  To help improve her fatigue, states she's on Adderall given to her by her psychiatrist and slowly lowering her dose, and was prescribed Vitamin D.    She's trying to sleep no more or less than nine hours per night.  Tries to exercise daily but has recently fallen off because she's "so exhausted and in so much pain that exercising makes it worse."  -Patient requests going to see neurologist to see if she has any neuromuscular  disorder as her brother and father do have.   Wt Readings from Last 3 Encounters:  08/03/19 137 lb 8 oz (62.4 kg)  06/09/19 132 lb 1.6 oz (59.9 kg) (57 %, Z= 0.17)*  05/27/19 132 lb 14.4 oz (60.3 kg) (58 %, Z= 0.21)*   * Growth percentiles are based on CDC (Girls, 2-20 Years) data.   BP Readings from Last 3 Encounters:  08/03/19 103/65  05/27/19 114/69  02/24/19 96/63   Pulse Readings from Last 3 Encounters:  08/03/19 96  05/27/19 (!) 110  02/24/19 (!) 101   BMI Readings from Last 3 Encounters:  08/03/19 20.75 kg/m  06/09/19 19.94 kg/m (27 %, Z= -0.62)*  05/27/19 20.06 kg/m (29 %, Z= -0.57)*   * Growth percentiles are based on CDC (Girls, 2-20 Years) data.     Patient Care Team    Relationship Specialty Notifications  Start End  William Hamburger D, NP PCP - General Family Medicine  12/23/18   Maeola Harman, MD  Pediatrics  04/03/18    Comment: Rhea Pink, MD Consulting Physician Psychiatry  12/24/18   Lavada Mesi, MD Consulting Physician Sports Medicine  08/03/19    Comment: [facility]     Patient Active Problem List   Diagnosis Date Noted  . Numbness and tingling of both legs - comes and goes after activity and onset of pain 08/03/2019  . Fatigue 05/27/2019  . Low back pain 02/24/2019  . Bilateral knee pain 02/24/2019  . Bilateral hip pain 02/24/2019  . Acute bilateral ankle pain 02/24/2019  . Healthcare maintenance 12/23/2018  . Generalized anxiety disorder 04/03/2018  . Attention deficit hyperactivity disorder (ADHD), combined type, severe 04/03/2018  . Mild recurrent major depression (HCC) 04/03/2018  . Developmental coordination disorder 04/03/2018  . Allergy with anaphylaxis due to food 02/02/2016  . Allergic rhinitis due to pollen 02/02/2016    Past Medical history, Surgical history, Family history, Social history, Allergies and Medications have been entered into the medical  record, reviewed and changed as needed.    Current Meds  Medication Sig  . amphetamine-dextroamphetamine (ADDERALL) 20 MG tablet Take 1 tablet by mouth 2 (two) times daily.  Marland Kitchen EPINEPHrine 0.3 mg/0.3 mL IJ SOAJ injection INJECT 0.3 MLS (0.3 MG TOTAL) INTO THE MUSCLE ONCE.  Marland Kitchen FERROUS SULFATE PO Take 27 mg by mouth daily.  . fluconazole (DIFLUCAN) 150 MG tablet Take 150 mg by mouth once.  Marland Kitchen FLUoxetine (PROZAC) 20 MG capsule Take 1 capsule by mouth daily.  . Levocetirizine Dihydrochloride (XYZAL PO) Take 10 mg by mouth daily.  Marland Kitchen tiZANidine (ZANAFLEX) 4 MG tablet Take 1 tablet (4 mg total) by mouth at bedtime.  . Vitamin D, Ergocalciferol, (DRISDOL) 1.25 MG (50000 UT) CAPS capsule Take 1 capsule (50,000 Units total) by mouth every 7 (seven) days.    Allergies:  Allergies  Allergen Reactions  . Other Anaphylaxis    All tree nuts, seasonal allergies dust, mold, pollen, horses, cockroaches, cats  . Peanut-Containing Drug Products Anaphylaxis  . Acyclovir And Related   . Augmentin [Amoxicillin-Pot Clavulanate] Hives    Patient says she has no allergies to antibiotics "As far as I know"     Review of Systems:  A fourteen system review of systems was performed and found to be positive as per HPI.   Objective:   Blood pressure 103/65, pulse 96, temperature 98.3 F (36.8 C), temperature source Oral, resp. rate 12, height 5' 8.25" (1.734 m), weight 137 lb 8 oz (62.4 kg), last menstrual period 08/01/2019, SpO2 100 %. Body mass index is 20.75 kg/m. General:  Well Developed, well nourished, appropriate for stated age.  Neuro:  Alert and oriented,  extra-ocular muscles intact  HEENT:  Normocephalic, atraumatic, neck supple, no carotid bruits appreciated  Skin:  no gross rash, warm, pink. Cardiac:  RRR, S1 S2 Respiratory:  ECTA B/L and A/P, Not using accessory muscles, speaking in full sentences- unlabored. Vascular:  Ext warm, no cyanosis apprec.; cap RF less 2 sec. Psych:  No HI/SI,  judgement and insight good, Euthymic mood. Full Affect.

## 2019-08-06 DIAGNOSIS — F902 Attention-deficit hyperactivity disorder, combined type: Secondary | ICD-10-CM | POA: Diagnosis not present

## 2019-08-06 DIAGNOSIS — F411 Generalized anxiety disorder: Secondary | ICD-10-CM | POA: Diagnosis not present

## 2019-08-06 DIAGNOSIS — F331 Major depressive disorder, recurrent, moderate: Secondary | ICD-10-CM | POA: Diagnosis not present

## 2019-08-06 DIAGNOSIS — F4312 Post-traumatic stress disorder, chronic: Secondary | ICD-10-CM | POA: Diagnosis not present

## 2019-08-18 ENCOUNTER — Telehealth: Payer: Self-pay | Admitting: Adult Health

## 2019-08-18 DIAGNOSIS — G8929 Other chronic pain: Secondary | ICD-10-CM

## 2019-08-18 DIAGNOSIS — M545 Low back pain, unspecified: Secondary | ICD-10-CM

## 2019-08-18 NOTE — Telephone Encounter (Signed)
Patient is requesting a refill of her VIt D. If approved [facility]  Also, she is requesting a call back from clinic staff to discuss a possible rheum referral. At last OV Dr. Val Eagle placed a neuro referral but per patient a rheum referral was discussed by not done as she wanted to look into the matter. The patient has decided she would like to move ahead with this if possible.

## 2019-08-18 NOTE — Telephone Encounter (Signed)
LVM for pt to call to discuss.  T. Jendaya Gossett, CMA  

## 2019-08-19 MED ORDER — VITAMIN D (ERGOCALCIFEROL) 1.25 MG (50000 UNIT) PO CAPS: 50000.0000 [IU] | ORAL_CAPSULE | ORAL | 0 refills | Status: DC

## 2019-08-19 NOTE — Telephone Encounter (Signed)
Vit D has been refilled.    Patient is requesting referral to Rheumatology in regards to the chronic lower back pain that was discussed at visit with patient on 08/03/19. Patient was referred then to Neurologist but is also wishing to be referred to Rheumatology for this issue.   Please advise. AS, CMA

## 2019-08-19 NOTE — Telephone Encounter (Signed)
Okay to send to rheumatology per patient request.

## 2019-08-19 NOTE — Telephone Encounter (Signed)
Referral has been placed to Rheumatology. Patient is aware. AS, CMA

## 2019-08-19 NOTE — Addendum Note (Signed)
Addended by: Sylvester Harder on: 08/19/2019 02:10 PM   Modules accepted: Orders

## 2019-08-19 NOTE — Addendum Note (Signed)
Addended by: Sylvester Harder on: 08/19/2019 12:00 PM   Modules accepted: Orders

## 2019-09-02 DIAGNOSIS — F902 Attention-deficit hyperactivity disorder, combined type: Secondary | ICD-10-CM | POA: Diagnosis not present

## 2019-09-02 DIAGNOSIS — F331 Major depressive disorder, recurrent, moderate: Secondary | ICD-10-CM | POA: Diagnosis not present

## 2019-09-02 DIAGNOSIS — F411 Generalized anxiety disorder: Secondary | ICD-10-CM | POA: Diagnosis not present

## 2019-09-02 DIAGNOSIS — F4312 Post-traumatic stress disorder, chronic: Secondary | ICD-10-CM | POA: Diagnosis not present

## 2019-09-22 NOTE — Progress Notes (Signed)
Office Visit Note  Patient: Cassandra Schultz             Date of Birth: [birth year]           MRN: [id number]             PCP: Esaw Grandchild, NP Referring: Mellody Dance, DO Visit Date: 09/24/2019 Occupation: @GUAROCC @  Subjective:  New Patient (Initial Visit) (Chronic joint pain)   History of Present Illness: Cassandra Schultz is a 19 y.o. female seen in consultation per request of her PCP.  According to patient she has had pain in her knee joints since she was in the elementary school.  She also recalls having lower back pain all her life.  She states she has noticed that her toes turn purple since she was a child.  She has suffered from chronic constipation.  And now she has been having painful heavy periods.  Her GYN is thinking that she may have endometriosis.  She states she bruises very easily and has history of tremors.  She states her symptoms got worse when she was in sophomore year.  Her lower back pain got worse and started radiating to her left lower extremity.  She states used to be very frequent and it has become less frequent now.  She recalls going for physical therapy at age 52 which was helpful.  She also went for physical therapy recently in October 2020 which was helpful.  She states in August 2020 she started working and started noticing that her symptoms gotten worse with increased pain in her lower back, hip joints, knee joints, ankles.  She also started having increased muscle pain in her lower extremities.  She states until 2018 she is to exercise for an hour daily including cardio and aerobic exercises but now she is only able to do some stretching exercises.  She was seen by Dr. Junius Roads in February 2021 and had x-ray of her lumbar spine which showed only mild scoliosis.  She also had MRI of her lumbar spine which was consistent with mild scoliosis otherwise was unremarkable.  Patient states that she has good days and bad days.  Certain week she is very active and can do  routine activities and certain weeks she is a lot of discomfort.  She also believes that she has hypermobility in her joints.  She states her PCP will refer her to neurologist for tremors.  There is no family history of autoimmune arthritis, lupus, rheumatoid arthritis.  Activities of Daily Living:  Patient reports morning stiffness for 1 hour.   Patient Reports nocturnal pain.  Difficulty dressing/grooming: Reports Difficulty climbing stairs: Reports Difficulty getting out of chair: Reports Difficulty using hands for taps, buttons, cutlery, and/or writing: Denies  Review of Systems  Constitutional: Positive for fatigue. Negative for night sweats, weight gain and weight loss.  HENT: Negative for mouth sores, trouble swallowing, trouble swallowing, mouth dryness and nose dryness.   Eyes: Positive for dryness. Negative for pain, redness and visual disturbance.  Respiratory: Negative for cough, shortness of breath and difficulty breathing.   Cardiovascular: Negative for chest pain, palpitations, hypertension, irregular heartbeat and swelling in legs/feet.  Gastrointestinal: Positive for constipation. Negative for blood in stool and diarrhea.  Endocrine: Negative for excessive thirst and increased urination.  Genitourinary: Negative for difficulty urinating and vaginal dryness.  Musculoskeletal: Positive for arthralgias, joint pain, morning stiffness and muscle tenderness. Negative for gait problem, joint swelling, myalgias, muscle weakness and myalgias.  Skin: Positive for color change.  Negative for rash, hair loss, skin tightness, ulcers and sensitivity to sunlight.  Allergic/Immunologic: Negative for susceptible to infections.  Neurological: Negative for dizziness, numbness, memory loss, night sweats and weakness.  Hematological: Positive for bruising/bleeding tendency. Negative for swollen glands.  Psychiatric/Behavioral: Positive for depressed mood and sleep disturbance. The patient is  nervous/anxious.     PMFS History:  Patient Active Problem List   Diagnosis Date Noted  . Numbness and tingling of both legs - comes and goes after activity and onset of pain 08/03/2019  . Fatigue 05/27/2019  . Low back pain 02/24/2019  . Bilateral knee pain 02/24/2019  . Bilateral hip pain 02/24/2019  . Acute bilateral ankle pain 02/24/2019  . Healthcare maintenance 12/23/2018  . Generalized anxiety disorder 04/03/2018  . Attention deficit hyperactivity disorder (ADHD), combined type, severe 04/03/2018  . Mild recurrent major depression ([place]) 04/03/2018  . Developmental coordination disorder 04/03/2018  . Allergy with anaphylaxis due to food 02/02/2016  . Allergic rhinitis due to pollen 02/02/2016    Past Medical History:  Diagnosis Date  . ADHD   . Allergy   . Anxiety   . Depression    reports ADHD     Family History  Problem Relation Age of Onset  . Allergic rhinitis Mother   . Depression Mother   . Arthritis Mother   . Graves' disease Mother   . Allergic rhinitis Father   . Allergic rhinitis Paternal Aunt   . Alcohol abuse Maternal Grandmother   . Depression Maternal Grandmother   . Hypothyroidism Maternal Grandmother   . Alcohol abuse Maternal Grandfather   . Other Brother   . Other Paternal Grandfather   . Angioedema Neg Hx   . Asthma Neg Hx   . Eczema Neg Hx   . Immunodeficiency Neg Hx   . Urticaria Neg Hx    Past Surgical History:  Procedure Laterality Date  . NO PAST SURGERIES    . WISDOM TOOTH EXTRACTION     Social History   Social History Narrative   ** Merged History Encounter **       Immunization History  Administered Date(s) Administered  . DTaP 09/13/1999, 11/14/1999, 01/15/2000, 11/20/2000, 01/24/2005  . Hepatitis A 10/11/2009, 12/19/2010  . Hepatitis B 2000/02/08, 08/14/1999, 04/22/2000  . HiB (PRP-OMP) 09/13/1999, 11/14/1999, 01/15/2000, 07/12/2000  . Hpv 10/11/2009, 12/13/2009, 04/17/2010  . IPV 09/13/1999, 11/14/1999, 04/22/2000,  01/24/2005  . Influenza,inj,Quad PF,6+ Mos 02/24/2019  . MMR 07/12/2000, 01/24/2005  . Meningococcal B Recombinant 07/12/2015  . Meningococcal Conjugate 12/19/2010, 07/12/2015  . Pneumococcal Conjugate-13 04/22/2000, 11/20/2000  . Pneumococcal-Unspecified 11/14/1999, 01/15/2000  . Tdap 10/11/2009  . Varicella 11/20/2000, 10/11/2009     Objective: Vital Signs: BP 104/68 (BP Location: Right Arm, Patient Position: Sitting, Cuff Size: Normal)   Pulse (!) 133   Ht 5' 8.5" (1.74 m)   Wt 139 lb (63 kg)   BMI 20.83 kg/m    Physical Exam Vitals and nursing note reviewed.  Constitutional:      Appearance: She is well-developed.  HENT:     Head: Normocephalic and atraumatic.  Eyes:     Conjunctiva/sclera: Conjunctivae normal.  Cardiovascular:     Rate and Rhythm: Normal rate and regular rhythm.     Heart sounds: Normal heart sounds.  Pulmonary:     Effort: Pulmonary effort is normal.     Breath sounds: Normal breath sounds.  Abdominal:     General: Bowel sounds are normal.     Palpations: Abdomen is soft.  Musculoskeletal:  Cervical back: Normal range of motion.  Lymphadenopathy:     Cervical: No cervical adenopathy.  Skin:    General: Skin is warm and dry.     Capillary Refill: Capillary refill takes less than 2 seconds.     Comments: Patient has some old scars on her left arm.  Neurological:     Mental Status: She is alert and oriented to person, place, and time.     Comments: Tremors noted on examination.  Psychiatric:        Behavior: Behavior normal.      Musculoskeletal Exam: C-spine, thoracic and lumbar spine with good range of motion.  She has dextroscoliosis.  There is no tenderness over SI joints.  Shoulder joints, elbow joints, wrist joints, MCPs and PIPs and DIPs with good range of motion with no synovitis.  Hip joints, knee joints, ankles, MTPs and PIPs with good range of motion with no synovitis.  CDAI Exam: CDAI Score: -- Patient Global: --; Provider  Global: -- Swollen: --; Tender: -- Joint Exam 09/24/2019   No joint exam has been documented for this visit   There is currently no information documented on the homunculus. Go to the Rheumatology activity and complete the homunculus joint exam.  Investigation: No additional findings.  Imaging: No results found.  Recent Labs: Lab Results  Component Value Date   WBC 6.3 05/27/2019   HGB 13.6 05/27/2019   PLT 231 05/27/2019   NA 142 05/27/2019   K 4.1 05/27/2019   CL 105 05/27/2019   CO2 24 05/27/2019   GLUCOSE 101 (H) 05/27/2019   BUN 9 05/27/2019   CREATININE 0.65 05/27/2019   BILITOT 0.3 05/27/2019   ALKPHOS 54 05/27/2019   AST 18 05/27/2019   ALT 14 05/27/2019   PROT 7.0 05/27/2019   ALBUMIN 4.6 05/27/2019   CALCIUM 9.2 05/27/2019   GFRAA 149 05/27/2019    Speciality Comments: No specialty comments available.  Procedures:  No procedures performed Allergies: Other, Peanut-containing drug products, Acyclovir and related, and Augmentin [amoxicillin-pot clavulanate]   Assessment / Plan:     Visit Diagnoses: Chronic bilateral low back pain without sciatica -patient has been experiencing lower back pain since her childhood.  She has had physical therapy twice in her lifetime.  She was recently seen by Dr. Junius Roads..  She had x-ray of her lumbar spine and MRI of her lumbar spine which was unremarkable except for dextroscoliosis.  She continues to have lower back pain.   02/24/19: RF<10. 05/27/19: TSH 1.980, vitamin D 15.1  Bilateral hip pain-she complains of hip pain off and on.  She had good range of motion of her hip joints.  Chronic pain of both knees -she complains of pain and discomfort in her bilateral knee joints.  No warmth swelling or effusion was noted.  She has no difficulty squatting and getting up from the squatting position.  Plan: Sedimentation rate, Cyclic citrul peptide antibody, IgG, 14-3-3 eta Protein, ANA, HLA-B27 antigen  Other fatigue -she gives history  of fatigue for many years.  Plan: CK  Vitamin D deficiency-her most recent vitamin D level was 15.  She is on vitamin D supplement.  That may contribute to fatigue.  Coarse tremors-she has appointment coming up with neurologist.  Generalized anxiety disorder-she is followed by her PCP and is on medications.  Patient was tearful during the conversation off and on.  Mild recurrent major depression ([facility])  Attention deficit hyperactivity disorder (ADHD), combined type, severe  Orders: Orders Placed This Encounter  Procedures  . Sedimentation rate  . Cyclic citrul peptide antibody, IgG  . 14-3-3 eta Protein  . ANA  . HLA-B27 antigen  . CK   No orders of the defined types were placed in this encounter.   Face-to-face time spent with patient was 50 minutes. Greater than 50% of time was spent in counseling and coordination of care.  Follow-Up Instructions: Return for Pain in multiple joints and muscles.   Bo Merino, MD  Note - This record has been created using Editor, commissioning.  Chart creation errors have been sought, but may not always  have been located. Such creation errors do not reflect on  the standard of medical care.

## 2019-09-24 ENCOUNTER — Ambulatory Visit (INDEPENDENT_AMBULATORY_CARE_PROVIDER_SITE_OTHER): Payer: BC Managed Care – PPO | Admitting: Rheumatology

## 2019-09-24 ENCOUNTER — Encounter: Payer: Self-pay | Admitting: Rheumatology

## 2019-09-24 ENCOUNTER — Other Ambulatory Visit: Payer: Self-pay

## 2019-09-24 VITALS — BP 104/68 | HR 133 | Ht 68.5 in | Wt 139.0 lb

## 2019-09-24 DIAGNOSIS — M25561 Pain in right knee: Secondary | ICD-10-CM | POA: Diagnosis not present

## 2019-09-24 DIAGNOSIS — F411 Generalized anxiety disorder: Secondary | ICD-10-CM

## 2019-09-24 DIAGNOSIS — M545 Low back pain, unspecified: Secondary | ICD-10-CM

## 2019-09-24 DIAGNOSIS — G8929 Other chronic pain: Secondary | ICD-10-CM

## 2019-09-24 DIAGNOSIS — R5383 Other fatigue: Secondary | ICD-10-CM

## 2019-09-24 DIAGNOSIS — M25551 Pain in right hip: Secondary | ICD-10-CM

## 2019-09-24 DIAGNOSIS — F330 Major depressive disorder, recurrent, mild: Secondary | ICD-10-CM

## 2019-09-24 DIAGNOSIS — F902 Attention-deficit hyperactivity disorder, combined type: Secondary | ICD-10-CM

## 2019-09-24 DIAGNOSIS — M25552 Pain in left hip: Secondary | ICD-10-CM

## 2019-09-24 DIAGNOSIS — M25562 Pain in left knee: Secondary | ICD-10-CM | POA: Diagnosis not present

## 2019-09-24 DIAGNOSIS — G252 Other specified forms of tremor: Secondary | ICD-10-CM

## 2019-09-24 DIAGNOSIS — E559 Vitamin D deficiency, unspecified: Secondary | ICD-10-CM

## 2019-09-30 ENCOUNTER — Telehealth: Payer: Self-pay | Admitting: Adult Health

## 2019-09-30 DIAGNOSIS — R200 Anesthesia of skin: Secondary | ICD-10-CM

## 2019-09-30 DIAGNOSIS — G8929 Other chronic pain: Secondary | ICD-10-CM

## 2019-09-30 DIAGNOSIS — F902 Attention-deficit hyperactivity disorder, combined type: Secondary | ICD-10-CM

## 2019-09-30 LAB — HLA-B27 ANTIGEN: HLA-B27 Antigen: NEGATIVE

## 2019-09-30 LAB — SEDIMENTATION RATE: Sed Rate: 6 mm/h (ref 0–20)

## 2019-09-30 LAB — CYCLIC CITRUL PEPTIDE ANTIBODY, IGG: Cyclic Citrullin Peptide Ab: <16 UNITS

## 2019-09-30 LAB — CK: Total CK: 90 U/L (ref 29–143)

## 2019-09-30 LAB — ANA: Anti Nuclear Antibody (ANA): NEGATIVE

## 2019-09-30 LAB — 14-3-3 ETA PROTEIN: 14-3-3 eta Protein: <0.2 ng/mL (ref <0.2)

## 2019-09-30 NOTE — Telephone Encounter (Signed)
Patient called w/ 2 issues :  States at one time Orpha Bur referred her to Neurology but that provider rejected her case w/ advice she needed by Rheumatology 1st --- Pt completed rheumatologist appt but still wants to be evaluated by Neuro (forwarding req to med Asst & referral Coord.)  --Pt also request her Vit D levels to be checked-message to med asst to review if necesssary & advise.  --glh

## 2019-09-30 NOTE — Telephone Encounter (Signed)
Reordering referral to Neurology since patient has now seen Rheumatology.   Left message for patient to notify her of this and to discuss Vit D labs with provider at apt 10/2019. AS, CMA

## 2019-10-01 NOTE — Progress Notes (Signed)
I will discuss results at the follow-up visit.

## 2019-10-16 ENCOUNTER — Ambulatory Visit
Admission: EM | Admit: 2019-10-16 | Discharge: 2019-10-16 | Disposition: A | Discharge status: Home / Self Care | Payer: BC Managed Care – PPO | Attending: Physician Assistant | Admitting: Physician Assistant

## 2019-10-16 DIAGNOSIS — Z113 Encounter for screening for infections with a predominantly sexual mode of transmission: Secondary | ICD-10-CM | POA: Diagnosis not present

## 2019-10-16 DIAGNOSIS — R21 Rash and other nonspecific skin eruption: Secondary | ICD-10-CM | POA: Insufficient documentation

## 2019-10-16 MED ORDER — CLOTRIMAZOLE-BETAMETHASONE 1-0.05 % EX CREA: TOPICAL_CREAM | CUTANEOUS | 0 refills | Status: DC

## 2019-10-16 NOTE — ED Triage Notes (Signed)
Pt requesting STD testing for screening purposes. Denies sx's or exposure. Pt also c/o a red rash to crease of buttocks area since yesterday.

## 2019-10-16 NOTE — ED Provider Notes (Signed)
[facility]    CSN: [id number] Arrival date & time: 10/16/19  1538      History   Chief Complaint Chief Complaint  Patient presents with  . SEXUALLY TRANSMITTED DISEASE    HPI Cassandra Schultz is a 19 y.o. female.   19 year old female comes in for multiple complaints.  1. Std check She is asymptomatic. Denies vaginal discharge, itching, spotting. Denies urinary symptoms such as frequency, dysuria, hematuria. Denies fever, chills, body aches. Denies abdominal pain, nausea, vomiting. LMP 09/25/2019. Denies exposure. States has multiple partners, and would like screening. Has consistent condom use. Takes OCPs.   2. Rash States irritation/pain to the crease between buttocks. Area tender to palpation without known erythema, drainage, warmth. Denies fever, chills, body aches. Applied diaper cream without relief.       Past Medical History:  Diagnosis Date  . ADHD   . Allergy   . Anxiety   . Depression    reports ADHD     Patient Active Problem List   Diagnosis Date Noted  . Numbness and tingling of both legs - comes and goes after activity and onset of pain 08/03/2019  . Fatigue 05/27/2019  . Low back pain 02/24/2019  . Bilateral knee pain 02/24/2019  . Bilateral hip pain 02/24/2019  . Acute bilateral ankle pain 02/24/2019  . Healthcare maintenance 12/23/2018  . Generalized anxiety disorder 04/03/2018  . Attention deficit hyperactivity disorder (ADHD), combined type, severe 04/03/2018  . Mild recurrent major depression ([facility]) 04/03/2018  . Developmental coordination disorder 04/03/2018  . Allergy with anaphylaxis due to food 02/02/2016  . Allergic rhinitis due to pollen 02/02/2016    Past Surgical History:  Procedure Laterality Date  . NO PAST SURGERIES    . WISDOM TOOTH EXTRACTION      OB History   No obstetric history on file.      Home Medications    Prior to Admission medications   Medication Sig Start Date End Date Taking? Authorizing  Provider  amphetamine-dextroamphetamine (ADDERALL) 20 MG tablet Take 1 tablet by mouth 2 (two) times daily. 05/21/19   [provider]  clotrimazole-betamethasone (LOTRISONE) cream Apply to affected area 2 times daily prn 10/16/19   Cathie Hoops, Licia Harl V, PA-C  EPINEPHrine 0.3 mg/0.3 mL IJ SOAJ injection INJECT 0.3 MLS (0.3 MG TOTAL) INTO THE MUSCLE ONCE. 02/11/17   Marcelyn Bruins, MD  FERROUS SULFATE PO Take 27 mg by mouth daily.    [provider]  FLUoxetine (PROZAC) 20 MG capsule Take 1 capsule by mouth daily. 12/04/18   [provider]  Levocetirizine Dihydrochloride (XYZAL PO) Take 10 mg by mouth daily.    [provider]  SPRINTEC 28 0.25-35 MG-MCG tablet Take 1 tablet by mouth.  07/02/19   [provider]  Vitamin D, Ergocalciferol, (DRISDOL) 1.25 MG (50000 UNIT) CAPS capsule Take 1 capsule (50,000 Units total) by mouth every 7 (seven) days. 08/19/19   Thomasene Lot, DO    Family History Family History  Problem Relation Age of Onset  . Allergic rhinitis Mother   . Depression Mother   . Arthritis Mother   . Graves' disease Mother   . Allergic rhinitis Father   . Allergic rhinitis Paternal Aunt   . Alcohol abuse Maternal Grandmother   . Depression Maternal Grandmother   . Hypothyroidism Maternal Grandmother   . Alcohol abuse Maternal Grandfather   . Other Brother   . Other Paternal Grandfather   . Angioedema Neg Hx   .  Asthma Neg Hx   . Eczema Neg Hx   . Immunodeficiency Neg Hx   . Urticaria Neg Hx     Social History Social History   Tobacco Use  . Smoking status: Never Smoker  . Smokeless tobacco: Never Used  Substance Use Topics  . Alcohol use: Never  . Drug use: Never     Allergies   Other, Peanut-containing drug products, Acyclovir and related, and Augmentin [amoxicillin-pot clavulanate]   Review of Systems Review of Systems  Reason unable to perform ROS: See HPI as above.     Physical Exam Triage Vital  Signs ED Triage Vitals  Enc Vitals Group     BP 10/16/19 1545 113/66     Pulse Rate 10/16/19 1545 (!) 105     Resp 10/16/19 1545 20     Temp 10/16/19 1545 98.8 F (37.1 C)     Temp Source 10/16/19 1545 Oral     SpO2 10/16/19 1545 98 %     Weight --      Height --      Head Circumference --      Peak Flow --      Pain Score 10/16/19 1549 0     Pain Loc --      Pain Edu? --      Excl. in [place]? --    No data found.  Updated Vital Signs BP 113/66 (BP Location: Left Arm)   Pulse (!) 105   Temp 98.8 F (37.1 C) (Oral)   Resp 20   LMP 09/25/2019   SpO2 98%   Physical Exam Constitutional:      General: She is not in acute distress.    Appearance: Normal appearance. She is well-developed. She is not toxic-appearing or diaphoretic.  HENT:     Head: Normocephalic and atraumatic.  Eyes:     Conjunctiva/sclera: Conjunctivae normal.     Pupils: Pupils are equal, round, and reactive to light.  Pulmonary:     Effort: Pulmonary effort is normal. No respiratory distress.     Comments: Speaking in full sentences without difficulty Genitourinary:    Comments: Erythema with some skin peeling to the crease between buttock. Does no extend to pilonidal area. Does not extend to rectum.  Musculoskeletal:     Cervical back: Normal range of motion and neck supple.  Skin:    General: Skin is warm and dry.  Neurological:     Mental Status: She is alert and oriented to person, place, and time.      UC Treatments / Results  Labs (all labs ordered are listed, but only abnormal results are displayed) Labs Reviewed  CERVICOVAGINAL ANCILLARY ONLY    EKG   Radiology No results found.  Procedures Procedures (including critical care time)  Medications Ordered in UC Medications - No data to display  Initial Impression / Assessment and Plan / UC Course  I have reviewed the triage vital signs and the nursing notes.  Pertinent labs & imaging results that were available during my care  of the patient were reviewed by me and considered in my medical decision making (see chart for details).    1. Std screening Offered HIV, RPR, for which patient declined. Cytology sent, patient will be contacted with any positive results that require additional treatment. Patient to refrain from sexual activity for the next 7 days. Return precautions given.   2. Rash Likely from moisture/friction. Cover for fungal infection with lotrisone. Continue zinc oxide. Return precautions given.  Final Clinical Impressions(s) / UC Diagnoses   Final diagnoses:  Screening for STD (sexually transmitted disease)  Rash    ED Prescriptions    Medication Sig Dispense Auth. Provider   clotrimazole-betamethasone (LOTRISONE) cream Apply to affected area 2 times daily prn 15 g Belinda Fisher, PA-C     PDMP not reviewed this encounter.   Belinda Fisher, PA-C 10/16/19 [phone]

## 2019-10-16 NOTE — Discharge Instructions (Addendum)
Std screening Cytology sent, you will be contacted with any positive results that requires further treatment. Refrain from sexual activity until testing results return.  Monitor for any worsening of symptoms, fever, abdominal pain, nausea, vomiting, to follow up for reevaluation.  Rash  Likely from moisture, can have yeast infection to it. Start lotrisone as directed. Continue diaper cream/paste. Avoid moisture, new hygiene product, irritation. If noticing signs of infection, spreading redness, warmth, fever, follow up for reevaluation.

## 2019-10-20 LAB — CERVICOVAGINAL ANCILLARY ONLY
Chlamydia: NEGATIVE
Comment: NEGATIVE
Comment: NEGATIVE
Comment: NORMAL
Neisseria Gonorrhea: NEGATIVE
Trichomonas: NEGATIVE

## 2019-10-23 DIAGNOSIS — F4312 Post-traumatic stress disorder, chronic: Secondary | ICD-10-CM | POA: Diagnosis not present

## 2019-10-23 DIAGNOSIS — F331 Major depressive disorder, recurrent, moderate: Secondary | ICD-10-CM | POA: Diagnosis not present

## 2019-10-23 DIAGNOSIS — F411 Generalized anxiety disorder: Secondary | ICD-10-CM | POA: Diagnosis not present

## 2019-10-23 DIAGNOSIS — F902 Attention-deficit hyperactivity disorder, combined type: Secondary | ICD-10-CM | POA: Diagnosis not present

## 2019-10-26 NOTE — Progress Notes (Signed)
Office Visit Note  Patient: Cassandra Schultz             Date of Birth: [birth year]           MRN: [id number]             PCP: Patient, No Pcp Per Referring: Esaw Grandchild, NP Visit Date: 10/27/2019 Occupation: @GUAROCC @  Subjective:  Pain in multiple joints and muscles.   History of Present Illness: Cassandra Schultz is a 19 y.o. female with history of pain in her joints and muscles.  She states she has been feeling somewhat better since she has been on vitamin D.  Although she still continues to have pain in almost all of her joints and her muscles.  She states she tried to jump roping but it caused increased pain in her hips and knees.  She continues to have muscle pain all over.  She is always had tremors.  She is waiting to see neurologist.  She recently has been having some headaches and dizziness.  Activities of Daily Living:  Patient reports morning stiffness for 2 hours.   Patient Reports nocturnal pain.  Difficulty dressing/grooming: Reports Difficulty climbing stairs: Reports Difficulty getting out of chair: Reports Difficulty using hands for taps, buttons, cutlery, and/or writing: Denies  Review of Systems  Constitutional: Positive for fatigue. Negative for night sweats, weight gain and weight loss.  HENT: Positive for mouth dryness. Negative for mouth sores, trouble swallowing, trouble swallowing and nose dryness.   Eyes: Negative for pain, redness, itching, visual disturbance and dryness.  Respiratory: Negative for cough, shortness of breath and difficulty breathing.   Cardiovascular: Negative for chest pain, palpitations, hypertension, irregular heartbeat and swelling in legs/feet.  Gastrointestinal: Positive for constipation. Negative for blood in stool and diarrhea.  Endocrine: Negative for increased urination.  Genitourinary: Negative for difficulty urinating, painful urination and vaginal dryness.  Musculoskeletal: Positive for arthralgias, joint pain, myalgias,  morning stiffness and myalgias. Negative for joint swelling, muscle weakness and muscle tenderness.  Skin: Negative for color change, rash, hair loss, redness, skin tightness, ulcers and sensitivity to sunlight.  Allergic/Immunologic: Negative for susceptible to infections.  Neurological: Positive for dizziness, tremors and headaches. Negative for numbness, memory loss, night sweats and weakness.  Hematological: Negative for bruising/bleeding tendency and swollen glands.  Psychiatric/Behavioral: Negative for depressed mood, confusion and sleep disturbance. The patient is not nervous/anxious.     PMFS History:  Patient Active Problem List   Diagnosis Date Noted  . Numbness and tingling of both legs - comes and goes after activity and onset of pain 08/03/2019  . Fatigue 05/27/2019  . Low back pain 02/24/2019  . Bilateral knee pain 02/24/2019  . Bilateral hip pain 02/24/2019  . Acute bilateral ankle pain 02/24/2019  . Healthcare maintenance 12/23/2018  . Generalized anxiety disorder 04/03/2018  . Attention deficit hyperactivity disorder (ADHD), combined type, severe 04/03/2018  . Mild recurrent major depression ([place]) 04/03/2018  . Developmental coordination disorder 04/03/2018  . Allergy with anaphylaxis due to food 02/02/2016  . Allergic rhinitis due to pollen 02/02/2016    Past Medical History:  Diagnosis Date  . ADHD   . Allergy   . Anxiety   . Depression    reports ADHD     Family History  Problem Relation Age of Onset  . Allergic rhinitis Mother   . Depression Mother   . Arthritis Mother   . Graves' disease Mother   . Allergic rhinitis Father   . Neuropathy Father  peripheral   . Allergic rhinitis Paternal Aunt   . Alcohol abuse Maternal Grandmother   . Depression Maternal Grandmother   . Hypothyroidism Maternal Grandmother   . Alcohol abuse Maternal Grandfather   . Other Paternal Grandfather   . Angioedema Neg Hx   . Asthma Neg Hx   . Eczema Neg Hx   .  Immunodeficiency Neg Hx   . Urticaria Neg Hx    Past Surgical History:  Procedure Laterality Date  . NO PAST SURGERIES    . WISDOM TOOTH EXTRACTION     Social History   Social History Narrative   ** Merged History Encounter **       Immunization History  Administered Date(s) Administered  . DTaP 09/13/1999, 11/14/1999, 01/15/2000, 11/20/2000, 01/24/2005  . Hepatitis A 10/11/2009, 12/19/2010  . Hepatitis B 04/26/2000, 08/14/1999, 04/22/2000  . HiB (PRP-OMP) 09/13/1999, 11/14/1999, 01/15/2000, 07/12/2000  . Hpv 10/11/2009, 12/13/2009, 04/17/2010  . IPV 09/13/1999, 11/14/1999, 04/22/2000, 01/24/2005  . Influenza,inj,Quad PF,6+ Mos 02/24/2019  . MMR 07/12/2000, 01/24/2005  . Meningococcal B Recombinant 07/12/2015  . Meningococcal Conjugate 12/19/2010, 07/12/2015  . Pneumococcal Conjugate-13 04/22/2000, 11/20/2000  . Pneumococcal-Unspecified 11/14/1999, 01/15/2000  . Tdap 10/11/2009  . Varicella 11/20/2000, 10/11/2009     Objective: Vital Signs: BP 117/79 (BP Location: Left Arm, Patient Position: Sitting, Cuff Size: Normal)   Pulse (!) 109   Resp 13   Ht 5' 8"  (1.727 m)   Wt 138 lb 12.8 oz (63 kg)   BMI 21.10 kg/m    Physical Exam Vitals and nursing note reviewed.  Constitutional:      Appearance: She is well-developed.  HENT:     Head: Normocephalic and atraumatic.  Eyes:     Conjunctiva/sclera: Conjunctivae normal.  Cardiovascular:     Rate and Rhythm: Normal rate and regular rhythm.     Heart sounds: Normal heart sounds.  Pulmonary:     Effort: Pulmonary effort is normal.     Breath sounds: Normal breath sounds.  Abdominal:     General: Bowel sounds are normal.     Palpations: Abdomen is soft.  Musculoskeletal:     Cervical back: Normal range of motion.  Lymphadenopathy:     Cervical: No cervical adenopathy.  Skin:    General: Skin is warm and dry.     Capillary Refill: Capillary refill takes less than 2 seconds.  Neurological:     Mental Status: She is  alert and oriented to person, place, and time.  Psychiatric:        Behavior: Behavior normal.      Musculoskeletal Exam: C-spine thoracic and lumbar spine were in good range of motion.  She was able to reach the ground with the palm of her hand.  She had no SI joint tenderness.  Shoulder joints, elbow joints, wrist joints, MCPs and PIPs with good range of motion with no synovitis.  She had some hypermobility in her elbows and wrist joints.  She has hyperextension of her knee joints.  Hip joints were in good range of motion with no discomfort today.  No tenderness over trochanteric bursa was noted.  CDAI Exam: CDAI Score: -- Patient Global: --; Provider Global: -- Swollen: --; Tender: -- Joint Exam 10/27/2019   No joint exam has been documented for this visit   There is currently no information documented on the homunculus. Go to the Rheumatology activity and complete the homunculus joint exam.  Investigation: No additional findings.  Imaging: No results found.  Recent Labs: Lab Results  Component Value Date   WBC 6.3 05/27/2019   HGB 13.6 05/27/2019   PLT 231 05/27/2019   NA 142 05/27/2019   K 4.1 05/27/2019   CL 105 05/27/2019   CO2 24 05/27/2019   GLUCOSE 101 (H) 05/27/2019   BUN 9 05/27/2019   CREATININE 0.65 05/27/2019   BILITOT 0.3 05/27/2019   ALKPHOS 54 05/27/2019   AST 18 05/27/2019   ALT 14 05/27/2019   PROT 7.0 05/27/2019   ALBUMIN 4.6 05/27/2019   CALCIUM 9.2 05/27/2019   GFRAA 149 05/27/2019  September 24, 2019 ESR 6, anti-CCP negative, 14 3 3  eta negative, ANA negative, HLA-B27 negative, CK 90 02/24/19: RF<10. 05/27/19: TSH 1.980, vitamin D 15.1  Speciality Comments: No specialty comments available.  Procedures:  No procedures performed Allergies: Other, Peanut-containing drug products, Acyclovir and related, and Augmentin [amoxicillin-pot clavulanate]   Assessment / Plan:     Visit Diagnoses: Hypermobility arthralgia-she has hypermobility in most of her  joints.  I believe her joint pain is related to hypermobility.  I offered physical therapy referral for isometric exercises which she declined.  She about the isometric exercise were demonstrated in the office.  She would benefit from water aerobics or swimming.  All autoimmune work-up was negative.  Chronic bilateral low back pain without sciatica - h/o LBP since childhood.  X-ray and MRI of lumbar spine were unremarkable except dextroscoliosis.  Core strengthening exercises were discussed.  Bilateral hip pain - She has good range of motion in her hip joints.  Chronic pain of both knees-she has hypermobility in her knee joints.  Keeping her knees soft was discussed.  She had no warmth swelling or effusion.  Other fatigue-probably related to vitamin D deficiency.  She has noticed some improvement.  Vitamin D deficiency-she is on vitamin D.  Coarse tremors -she is appointment coming up with neurologist.  She has been experiencing headaches and dizziness as well.  I have advised her to discuss that further with the neurologist.  Generalized anxiety disorder-she is on medications.  Mild recurrent major depression (HCC)  Attention deficit hyperactivity disorder (ADHD), combined type, severe  Orders: No orders of the defined types were placed in this encounter.  No orders of the defined types were placed in this encounter.   Follow-Up Instructions: Return if symptoms worsen or fail to improve, for arthralgia, myalgia.   Bo Merino, MD  Note - This record has been created using Editor, commissioning.  Chart creation errors have been sought, but may not always  have been located. Such creation errors do not reflect on  the standard of medical care.

## 2019-10-27 ENCOUNTER — Encounter: Payer: Self-pay | Admitting: Rheumatology

## 2019-10-27 ENCOUNTER — Ambulatory Visit (INDEPENDENT_AMBULATORY_CARE_PROVIDER_SITE_OTHER): Payer: BC Managed Care – PPO | Admitting: Rheumatology

## 2019-10-27 ENCOUNTER — Other Ambulatory Visit: Payer: Self-pay

## 2019-10-27 VITALS — BP 117/79 | HR 109 | Resp 13 | Ht 68.0 in | Wt 138.8 lb

## 2019-10-27 DIAGNOSIS — M25562 Pain in left knee: Secondary | ICD-10-CM

## 2019-10-27 DIAGNOSIS — M25552 Pain in left hip: Secondary | ICD-10-CM

## 2019-10-27 DIAGNOSIS — F902 Attention-deficit hyperactivity disorder, combined type: Secondary | ICD-10-CM

## 2019-10-27 DIAGNOSIS — F330 Major depressive disorder, recurrent, mild: Secondary | ICD-10-CM

## 2019-10-27 DIAGNOSIS — M25551 Pain in right hip: Secondary | ICD-10-CM

## 2019-10-27 DIAGNOSIS — M2550 Pain in unspecified joint: Secondary | ICD-10-CM

## 2019-10-27 DIAGNOSIS — F411 Generalized anxiety disorder: Secondary | ICD-10-CM

## 2019-10-27 DIAGNOSIS — R5383 Other fatigue: Secondary | ICD-10-CM

## 2019-10-27 DIAGNOSIS — G252 Other specified forms of tremor: Secondary | ICD-10-CM

## 2019-10-27 DIAGNOSIS — M25561 Pain in right knee: Secondary | ICD-10-CM

## 2019-10-27 DIAGNOSIS — M545 Low back pain, unspecified: Secondary | ICD-10-CM

## 2019-10-27 DIAGNOSIS — G8929 Other chronic pain: Secondary | ICD-10-CM

## 2019-10-27 DIAGNOSIS — E559 Vitamin D deficiency, unspecified: Secondary | ICD-10-CM

## 2019-10-30 DIAGNOSIS — F331 Major depressive disorder, recurrent, moderate: Secondary | ICD-10-CM | POA: Diagnosis not present

## 2019-10-30 DIAGNOSIS — F4312 Post-traumatic stress disorder, chronic: Secondary | ICD-10-CM | POA: Diagnosis not present

## 2019-10-30 DIAGNOSIS — F411 Generalized anxiety disorder: Secondary | ICD-10-CM | POA: Diagnosis not present

## 2019-10-30 DIAGNOSIS — F902 Attention-deficit hyperactivity disorder, combined type: Secondary | ICD-10-CM | POA: Diagnosis not present

## 2019-10-31 DIAGNOSIS — F331 Major depressive disorder, recurrent, moderate: Secondary | ICD-10-CM | POA: Diagnosis not present

## 2019-11-02 NOTE — Progress Notes (Signed)
[facility]    Provider:  Dr Lucia Gaskins Requesting Provider: Thomasene Lot DO Primary Care Provider:  Koren Shiver, MD  CC:  Low back pain  HPI:  Cassandra Schultz is a 19 y.o. female here as requested by Thomasene Lot, DO for numbness and tingling with chronic bilateral low back pain without sciatica.  I reviewed Dr. Chilton Si notes: she has chronic bilateral low back pain without sciatica, she also reports numbness and tingling of both legs comes and goes actor activity and with pain, she also has generalized anxiety disorder and is managed by psychiatry.  For her chronic bilateral low back pain without sciatica, numbness and tingling in both legs she was referred to orthopedics but was lost to follow-up.  She also feels fatigued in her legs with some weakness and so she was referred here to neurology, she was also referred to rheumatology for diffuse joint pain.  She has a family history of neuropathy in her father, peripheral neuropathy.  Patient is here alone and she reports: She was having :debilitating" pain in the back, hips, knees. Pain in joints ever since she was young. But when she started working in June 2020 and progressed til October when it was severe and keeping her in bed, she can't exercise, she is going back to PT and she is hyper mobile and her pcp is aware and she is working her up for [employer] or other disorders. She can't stand for more than an hour, she can't do yoga anymore due to the pain, and extreme fatigue. She has been to Rheumatology and by report the workup was negative. She also gets sciatica pain. Her low back pain is described as aching in the muscles on the sides, no spine pain, she has sharp pains in the hips and the knees and soreness in the joint more like aching and sharp pains behind the knee cap. Shooting pain in the outer thigh and skins. She has also experienced growing pains. Definitely all symptoms are associated with the  low back.   Reviewed notes, labs and imaging from outside physicians, which showed:   Normal CK, normal ANA, normal sed rate, normal CBC and CMP, normal TSH, normal hemoglobin A1c, normal RF  MRI lumbar spine 07/16/2019: personally reviewed images and agree with the following (reviewed images with patient as well)   Vertebrae:  No fracture, evidence of discitis, or bone lesion.  Conus medullaris and cauda equina: Conus extends to the L1 level. Conus and cauda equina appear normal.  Paraspinal and other soft tissues: Unremarkable.  Disc levels:  Disc height and hydration are preserved throughout the lumbar spine. No disc herniation is identified, and the spinal canal and neural foramina are widely patent.  IMPRESSION: Mild thoracolumbar dextroscoliosis. Otherwise unremarkable lumbar spine MRI.   Review of Systems: Patient complains of symptoms per HPI as well as the following symptoms: joint pain, hypermobility, digestion problems, fatigue, tremor. Pertinent negatives and positives per HPI. All others negative.   Social History   Socioeconomic History  . Marital status: Single    Spouse name: Not on file  . Number of children: 0  . Years of education: Not on file  . Highest education level: Some college, no degree  Occupational History  . Not on file  Tobacco Use  . Smoking status: Never Smoker  . Smokeless tobacco: Never Used  Substance and Sexual Activity  . Alcohol use: Never  . Drug use: Never  . Sexual activity: Yes    Birth  control/protection: Condom, Pill  Other Topics Concern  . Not on file  Social History Narrative   Lives at home with roommate   Caffeine: approx. 25-50 mg some days       ** Merged History Encounter **       Social Determinants of Health   Financial Resource Strain:   . Difficulty of Paying Living Expenses:   Food Insecurity:   . Worried About Charity fundraiser in the Last Year:   . Arboriculturist in the Last Year:     Transportation Needs:   . Film/video editor (Medical):   Marland Kitchen Lack of Transportation (Non-Medical):   Physical Activity:   . Days of Exercise per Week:   . Minutes of Exercise per Session:   Stress:   . Feeling of Stress :   Social Connections:   . Frequency of Communication with Friends and Family:   . Frequency of Social Gatherings with Friends and Family:   . Attends Religious Services:   . Active Member of Clubs or Organizations:   . Attends Archivist Meetings:   Marland Kitchen Marital Status:   Intimate Partner Violence:   . Fear of Current or Ex-Partner:   . Emotionally Abused:   Marland Kitchen Physically Abused:   . Sexually Abused:     Family History  Problem Relation Age of Onset  . Allergic rhinitis Mother   . Depression Mother   . Arthritis Mother   . Graves' disease Mother   . Allergic rhinitis Father   . Neuropathy Father        peripheral   . Allergic rhinitis Paternal Aunt   . Alcohol abuse Maternal Grandmother   . Depression Maternal Grandmother   . Hypothyroidism Maternal Grandmother   . Angina Maternal Grandmother   . Alcohol abuse Maternal Grandfather   . Other Paternal Grandfather        FSHD   . Angioedema Neg Hx   . Asthma Neg Hx   . Eczema Neg Hx   . Immunodeficiency Neg Hx   . Urticaria Neg Hx     Past Medical History:  Diagnosis Date  . ADHD   . Allergy   . Anxiety   . Depression    reports ADHD   . Raynaud's syndrome   . Tremor of both hands     Patient Active Problem List   Diagnosis Date Noted  . Numbness and tingling of both legs - comes and goes after activity and onset of pain 08/03/2019  . Fatigue 05/27/2019  . Low back pain 02/24/2019  . Bilateral knee pain 02/24/2019  . Bilateral hip pain 02/24/2019  . Acute bilateral ankle pain 02/24/2019  . Healthcare maintenance 12/23/2018  . Generalized anxiety disorder 04/03/2018  . Attention deficit hyperactivity disorder (ADHD), combined type, severe 04/03/2018  . Mild recurrent major  depression ([place]) 04/03/2018  . Developmental coordination disorder 04/03/2018  . Allergy with anaphylaxis due to food 02/02/2016  . Allergic rhinitis due to pollen 02/02/2016    Past Surgical History:  Procedure Laterality Date  . NO PAST SURGERIES    . WISDOM TOOTH EXTRACTION      Current Outpatient Medications  Medication Sig Dispense Refill  . amphetamine-dextroamphetamine (ADDERALL) 20 MG tablet Take 1 tablet by mouth 2 (two) times daily.    Marland Kitchen FLUoxetine (PROZAC) 20 MG capsule Take 1 capsule by mouth daily.    . Levocetirizine Dihydrochloride (XYZAL PO) Take 10 mg by mouth daily.    Marland Kitchen  SPRINTEC 28 0.25-35 MG-MCG tablet Take 1 tablet by mouth.     . Vitamin D, Ergocalciferol, (DRISDOL) 1.25 MG (50000 UNIT) CAPS capsule Take 1 capsule (50,000 Units total) by mouth every 7 (seven) days. 16 capsule 0  . EPINEPHrine 0.3 mg/0.3 mL IJ SOAJ injection INJECT 0.3 MLS (0.3 MG TOTAL) INTO THE MUSCLE ONCE. 2 Device 1   No current facility-administered medications for this visit.    Allergies as of 11/03/2019 - Review Complete 11/03/2019  Allergen Reaction Noted  . Other Anaphylaxis 02/02/2016  . Peanut-containing drug products Anaphylaxis 02/02/2016  . Acyclovir and related  08/02/2017  . Augmentin [amoxicillin-pot clavulanate] Hives 02/02/2016    Vitals: BP 123/82 (BP Location: Left Arm, Patient Position: Sitting)   Pulse 75   Ht 5\' 8"  (1.727 m)   Wt 143 lb (64.9 kg)   BMI 21.74 kg/m  Last Weight:  Wt Readings from Last 1 Encounters:  11/03/19 143 lb (64.9 kg)   Last Height:   Ht Readings from Last 1 Encounters:  11/03/19 5\' 8"  (1.727 m)     Physical exam: Exam: Gen: NAD, conversant, well nourised, thin and tall with long limbs and fingers, well groomed                     CV: RRR, no MRG. No Carotid Bruits. No peripheral edema, warm, nontender Eyes: Conjunctivae clear without exudates or hemorrhage  Neuro: Detailed Neurologic Exam  Speech:    Speech is normal;  fluent and spontaneous with normal comprehension.  Cognition:    The patient is oriented to person, place, and time;     recent and remote memory intact;     language fluent;     normal attention, concentration,     fund of knowledge Cranial Nerves:    The pupils are equal, round, and reactive to light. The fundi are flat. Visual fields are full to finger confrontation. Extraocular movements are intact. Trigeminal sensation is intact and the muscles of mastication are normal. The face is symmetric. The palate elevates in the midline. Hearing intact. Voice is normal. Shoulder shrug is normal. The tongue has normal motion without fasciculations.   Coordination:    Normal finger to nose and heel to shin.   Gait:    Heel-toe gait are normal. Imbalance with tandem.  Motor Observation:    No asymmetry, no atrophy, and no involuntary movements noted. Tone:    Normal muscle tone.    Posture:    Posture is normal. normal erect    Strength:    Strength is V/V in the upper and lower limbs.      Sensation: intact to LT     Reflex Exam:  DTR's:    Deep tendon reflexes in the upper and lower extremities are normal bilaterally.   Toes:    The toes are downgoing bilaterally.   Clonus:    Clonus is absent.    Assessment/Plan:  Lovely 19 year old here for multiple complaints of joint pain especially in the low back, hips, knees. MRI of the lumbar spine did not show any nerve root impingement, and actually looked very good except for scoliosis. Symptoms are unlikely to be neurologic, we had a long discussion about hypermobility syndromes such as Marfan syndrome or Ehlers-Danlos and she does have tall slender frame, long limbs, along face and she is positive for Marfan wrist and thumb sign. We discussed scheduling an EMG nerve conduction study but I do think that would be  low yield. I am able to order a genetic test for Marfan's but I do not have access to any genetic testing for Ehlers-Danlos. I  do recommend she discuss with her primary care because patients with hypermobility disorders can have a lot of the symptoms that she is having.  - suggest workup for hypermobility disorders such as Marfan and Ehlers-Danlos. I have access to one marfan genetic test and will see if we can get that but I don't have access to any other genetic testing.  F/u with pcp.   Orders Placed This Encounter  Procedures  . FBN1: Marfan Syndrome    Cc:   Thomasene Lot DO, Koren Shiver MD  Naomie Dean, MD  [facility] [address] [place], [place] [address]  Phone [phone] Fax [phone]

## 2019-11-03 ENCOUNTER — Ambulatory Visit (INDEPENDENT_AMBULATORY_CARE_PROVIDER_SITE_OTHER): Payer: BC Managed Care – PPO | Admitting: Neurology

## 2019-11-03 ENCOUNTER — Encounter: Payer: Self-pay | Admitting: Neurology

## 2019-11-03 ENCOUNTER — Other Ambulatory Visit: Payer: Self-pay

## 2019-11-03 VITALS — BP 123/82 | HR 75 | Ht 68.0 in | Wt 143.0 lb

## 2019-11-03 DIAGNOSIS — M357 Hypermobility syndrome: Secondary | ICD-10-CM

## 2019-11-03 DIAGNOSIS — M41125 Adolescent idiopathic scoliosis, thoracolumbar region: Secondary | ICD-10-CM

## 2019-11-03 NOTE — Patient Instructions (Signed)
Marfan Syndrome Marfan syndrome is a rare condition that weakens the connective tissues in the body. Connective tissue supports the body's tissues and organs. This disorder is caused by changes in a certain gene (genetic mutation). It most commonly affects the heart, bones, joints, eyes, and blood vessels. Marfan syndrome can lead to serious complications in the heart, blood vessels, eyes, and lungs. What are the causes? This condition is caused by a genetic mutation. The mutation may be passed from parent to child (inherited) or it may happen without a known cause. What increases the risk? You are more likely to develop this condition if you have a family history of Marfan syndrome. What are the signs or symptoms? Symptoms of Marfan syndrome may vary from mild to severe. Common signs and symptoms include:  Long arms and legs.  Long, thin fingers.  Curved spine (scoliosis).  Nearsightedness and other vision problems.  A chest that sticks out (pectus carinatum) or looks sunken in (pectus excavatum).  Flat feet.  Crowded teeth.  Stretch marks on the skin.  Headaches.  Pain in the back, legs, or abdomen.  Loose joints.  Tall, thin stature.  Long, narrow face. Severe signs and symptoms may include:  Heart problems, especially problems with: ? The main artery that supplies blood from the heart to the rest of the body (aorta). ? Heart valves.  Eye problems, such as: ? Dislocated lens. ? Cataracts. ? Glaucoma. ? Retinal detachment.  Lung collapse (spontaneous pneumothorax). How is this diagnosed? This condition may be diagnosed based on:  Your medical history.  Blood tests. This may also be used for genetic testing.  A physical exam, which includes: ? A slit lamp eye exam. This is used to see if the lenses are in the right place. ? A heart exam. This may include:  Echocardiogram. This test uses sound waves (ultrasound) to create an image of the  heart.  Electrocardiogram (ECG). This test records the electrical impulses of the heart.  MRI.  CT scan.  Chest X-ray. How is this treated? There is no cure for this condition, but treatment can help:  To manage symptoms.  To prevent or treat heart problems.  To prevent or treat vision problems. Treatment for this condition may include:  Medicines: ? To lower your blood pressure. ? To reduce strain on your heart. ? To reduce pain.  Eyeglasses or contact lenses.  Monitoring your condition for any heart changes. Monitoring may be done in the hospital or through regular checkups with a health care provider.  A back brace to help with scoliosis.  Placing a tube in your chest if you had a collapsed lung.  Surgery to correct: ? Heart problems. ? Scoliosis. ? The formation of your chest. ? A collapsed lung. ? Eye problems. ? Skeletal problems. Follow these instructions at home: Health care and support  If you had heart valve surgery, tell all health care providers, including your dentist, that you had this surgery.  Get an eye exam every year or as often as directed by your health care provider.  Visit the dentist two times a year. Brush your teeth two times a day, and floss at least once a day.  Consider joining a support group for people with Marfan syndrome. This may help you cope with any stress or issues that are related to your condition. Ask your health care provider for more information. Medicines  Take over-the-counter and prescription medicines only as told by your health care provider.  Do  not drive or use heavy machinery while taking prescription pain medicine, as told by your health care provider. Activity  Exercise as directed by your health care provider.  Do not take part in high-risk activities or contact sports, such as football or soccer.  Ask your health care provider what activities are safe for you. General instructions   Do not use any  products that contain nicotine or tobacco, such as cigarettes, e-cigarettes, and chewing tobacco. If you need help quitting, ask your health care provider.  If you are planning to become pregnant, talk with your health care provider. Pregnancy can increase stress on your heart.  Wear your back brace, supportive shoes, or foot inserts as directed by your health care provider.  Keep all follow-up visits as told by your health care provider. This is important. Where to find more information  The Marfan Foundation: www.marfan.org  National Organization for Rare Disorders: rarediseases.org  [employer]: https://www.reeves-zamora.org/ Contact a health care provider if:  You have new symptoms.  Your symptoms get worse instead of better.  Your legs are numb or painful.  You have tingling in your legs or arms.  You have a headache.  You have fatigue.  You have an unexplained fever.  You are snoring more than usual, or you are having difficulty sleeping. Get help right away if:  You have sudden or severe pain in your chest, back, or abdomen.  You have trouble breathing or shortness of breath.  You faint.  You have changes in your vision, such as bright flashes, blurred vision, or blindness.  Your heart is beating rapidly or irregularly.  You have sudden pain on one side of your body. These symptoms may represent a serious problem that is an emergency. Do not wait to see if the symptoms will go away. Get medical help right away. Call your local emergency services (911 in the [place] Do not drive yourself to the hospital. Summary  Marfan syndrome is a rare connective tissue disorder that is caused by changes in a certain gene (genetic mutation).  There is no cure for this condition, but treatment can help manage symptoms and prevent or treat any heart or vision problems.  Get an eye exam every year or as often as directed by your health care provider.  Take  over-the-counter and prescription medicines only as told by your health care provider.  Exercise as directed by your health care provider. Ask your health care provider what activities are safe for you. This information is not intended to replace advice given to you by your health care provider. Make sure you discuss any questions you have with your health care provider. Document Revised: 01/14/2018 Document Reviewed: 01/14/2018 Elsevier Patient Education  2020 [employer] Ehlers-Danlos Syndrome Ehlers-Danlos syndrome (EDS) is a group of genetic disorders that affect the body's connective tissues, which are made up of proteins. Connective tissues give support and elasticity to your skin, bones, joints, blood vessels, and internal organs. EDS causes connective tissues to become weak, fragile, and stretchable. People with EDS often have overly flexible joints and stretchy, fragile skin. There are 13 types of EDS, but many of those are rare. The most common types affect joints and skin. Other types affect the eyes, spine, or heart valves. The most severe types affect the walls of blood vessels and the structures of internal organs. What are the causes? This condition is caused by abnormal genes (genetic defects). Some types are passed down from parent to  child (inherited) and others are not. Sometimes, the genes can become defective during development in the womb. What are the signs or symptoms? The signs and symptoms of EDS depend on the type. Signs and symptoms may start in childhood or later in life. Signs and symptoms of the common types include: Joint and muscle symptoms  Overly flexible joints that have a large range of motion and can dislocate easily.  Joint pain.  Weak, floppy muscles. Skin symptoms  Velvety, soft skin that stretches and sags.  Skin that bruises and tears easily.  Skin tears that are hard to repair with stitches (sutures) and heal with wide scars.  Lumps under the  skin. Vision symptoms  Rupture of the eyeball or tearing of the tissue inside the eye (retinal detachment).  Changes in vision, including seeing floaters, flashes, or having sudden loss of vision. Vascular symptoms  Bleeding from tears or ruptures of internal organs or blood vessels.  Weak heart valves causing weakening of the heart. Orthopedic symptoms  Curved spine, which can cause difficulty breathing.  Bowed limbs and short stature.  Muscle or tendon tears.  Weak, thin bones.  Club foot. Other symptoms  Collapsed lung.  Unusual facial features, such as a thin nose and lips, sunken cheeks, small ear lobes, and prominent eyes. How is this diagnosed? Your health care provider may suspect EDS based on your symptoms and medical history, especially if EDS runs in your family. Your health care provider will also do a physical exam. You may also have diagnostic tests including:  Blood tests to check for genetic defects.  Tissue tests to check for abnormal body proteins (collagen).  X-rays of the spine.  Imaging studies of internal organs (MRI or CT scan).  Bone scan.  Heart ultrasound (echocardiogram) to look for heart valve problems. How is this treated? There is no cure for this condition, but treatment can help symptoms and prevent complications. Treatment depends on the type of EDS and the symptoms it causes. Treatment options can include:  Over-the-counter pain medicines.  Vitamin C to strengthen skin.  Vitamin D and calcium to strengthen bones.  Blood pressure medicines to prevent blood vessel tears or ruptures.  Physical therapy to strengthen muscles and joints.  Assistive devices that help you move, like braces or a wheelchair, walker, or scooter.  Surgery to repair wounds, tears, joints, or ruptures. Follow these instructions at home:   Take over-the-counter and prescription medicines only as told by your health care provider.  Return to your normal  activities as told by your health care provider. Ask your health care provider what activities are safe for you. It may be important to avoid contact sports and heavy lifting.  Let your health care provider know if you become pregnant. You will need special care to prevent rupture of the womb.  Let all health care providers know that you have EDS before any surgery or procedure.  Wear a medical alert bracelet.  Schedule eye exams often.  Consider having genetic counseling to learn if you may pass EDS on to your children. Contact a health care provider if:  You have joint pain.  You cut or tear your skin.  You have weakness or shortness of breath.  You have any new symptoms. Get help right away if you have:  Any sudden and severe pain.  Trouble breathing.  Chest pain.  Floaters or flashes in your vision or sudden loss of vision. Summary  Ehlers-Danlos syndrome (EDS) is a group of  genetic disorders that affect the connective tissues of the body.  EDS is caused by abnormal genes (genetic defects).  The effects of EDS can range from having overly flexible joints and fragile skin to very serious complications, such as blood vessel or organ ruptures.  There is no cure for this condition, but treatment can relieve some symptoms and prevent complications. This information is not intended to replace advice given to you by your health care provider. Make sure you discuss any questions you have with your health care provider. Document Revised: 01/05/2019 Document Reviewed: 01/05/2019 Elsevier Patient Education  2020 [employer].

## 2019-11-09 ENCOUNTER — Other Ambulatory Visit: Payer: Self-pay

## 2019-11-09 ENCOUNTER — Encounter: Payer: Self-pay | Admitting: Family Medicine

## 2019-11-09 ENCOUNTER — Ambulatory Visit (INDEPENDENT_AMBULATORY_CARE_PROVIDER_SITE_OTHER): Payer: BC Managed Care – PPO | Admitting: Family Medicine

## 2019-11-09 VITALS — BP 108/72 | HR 99 | Temp 98.6°F | Ht 68.0 in | Wt 139.6 lb

## 2019-11-09 DIAGNOSIS — M2550 Pain in unspecified joint: Secondary | ICD-10-CM

## 2019-11-09 NOTE — Progress Notes (Signed)
5/24/20211:34 PM  Cassandra Schultz May 14, 2001, 19 y.o., female [id number]  Chief Complaint  Patient presents with  . Genetic Evaluation    wants testing for ehlers danlose ssymdrome    HPI:   Patient is a 19 y.o. female with past medical history significant for GAD who presents today requesting referral for genetic evaluation  Patient with long history of joint pain and hypermobility  Patient seen by neurology on may 18th 2021 for chronic low back pain and paresthesias of lower extremities. No neuro concerns, wondering about marfans (positive marfan wrist and thumb sign) or ehlers danlos, recommends genetic testing  Patient seen by rheum, last ov Oct 27 2019 - hypermobility arthralgia, recommends PT, vitamin D deficiency - started replacement  She reports she has also been evaluated by ortho  She has engaged with PT focusing on strengthening exercises However she is currently in so much pain that unable to exercise  Autoimmune labs normal MRI: mild scioliosis, thoracolumbar  GAD managed by pscyhiatry  Depression screen [facility] 2/9 11/09/2019 08/03/2019 05/27/2019  Decreased Interest 1 3 2   Down, Depressed, Hopeless 0 0 1  PHQ - 2 Score 1 3 3   Altered sleeping 0 3 3  Tired, decreased energy 3 3 3   Change in appetite 1 3 3   Feeling bad or failure about yourself  1 0 0  Trouble concentrating 1 2 1   Moving slowly or fidgety/restless 0 2 1  Suicidal thoughts 0 0 0  PHQ-9 Score 7 16 14   Difficult doing work/chores Somewhat difficult Very difficult Very difficult    Fall Risk  08/03/2019 04/28/2019  Falls in the past year? 0 0  Number falls in past yr: 0 -  Injury with Fall? 0 -  Follow up Falls evaluation completed -     Allergies  Allergen Reactions  . Other Anaphylaxis    All tree nuts, seasonal allergies dust, mold, pollen, horses, cockroaches, cats  . Peanut-Containing Drug Products Anaphylaxis  . Acyclovir And Related   . Augmentin [Amoxicillin-Pot Clavulanate]  Hives    Patient says she has no allergies to antibiotics "As far as I know"    Prior to Admission medications   Medication Sig Start Date End Date Taking? Authorizing Provider  amphetamine-dextroamphetamine (ADDERALL) 20 MG tablet Take 1 tablet by mouth 2 (two) times daily. 05/21/19  Yes [provider]  EPINEPHrine 0.3 mg/0.3 mL IJ SOAJ injection INJECT 0.3 MLS (0.3 MG TOTAL) INTO THE MUSCLE ONCE. 02/11/17  Yes Padgett, Rae Halsted, MD  FLUoxetine (PROZAC) 20 MG capsule Take 1 capsule by mouth daily. 12/04/18  Yes [provider]  Levocetirizine Dihydrochloride (XYZAL PO) Take 10 mg by mouth daily.   Yes [provider]  [place] 28 0.25-35 MG-MCG tablet Take 1 tablet by mouth.  07/02/19  Yes [provider]  Vitamin D, Ergocalciferol, (DRISDOL) 1.25 MG (50000 UNIT) CAPS capsule Take 1 capsule (50,000 Units total) by mouth every 7 (seven) days. 08/19/19  Yes Mellody Dance, DO    Past Medical History:  Diagnosis Date  . ADHD   . Allergy   . Anxiety   . Depression    reports ADHD   . Raynaud's syndrome   . Tremor of both hands     Past Surgical History:  Procedure Laterality Date  . NO PAST SURGERIES    . WISDOM TOOTH EXTRACTION      Social History   Tobacco Use  . Smoking status: Never Smoker  . Smokeless tobacco: Never Used  Substance  Use Topics  . Alcohol use: Never    Family History  Problem Relation Age of Onset  . Allergic rhinitis Mother   . Depression Mother   . Arthritis Mother   . Graves' disease Mother   . Allergic rhinitis Father   . Neuropathy Father        peripheral   . Allergic rhinitis Paternal Aunt   . Alcohol abuse Maternal Grandmother   . Depression Maternal Grandmother   . Hypothyroidism Maternal Grandmother   . Angina Maternal Grandmother   . Alcohol abuse Maternal Grandfather   . Other Paternal Grandfather        FSHD   . Angioedema Neg Hx   . Asthma Neg Hx   . Eczema Neg Hx   . Immunodeficiency  Neg Hx   . Urticaria Neg Hx     ROS Per hpi  OBJECTIVE:  Today's Vitals   11/09/19 1331  BP: 108/72  Pulse: 99  Temp: 98.6 F (37 C)  SpO2: 97%  Weight: 139 lb 9.6 oz (63.3 kg)  Height: 5\' 8"  (1.727 m)   Body mass index is 21.23 kg/m.   Physical Exam Vitals and nursing note reviewed.  Constitutional:      Appearance: She is well-developed.  HENT:     Head: Normocephalic and atraumatic.  Eyes:     General: No scleral icterus.    Conjunctiva/sclera: Conjunctivae normal.     Pupils: Pupils are equal, round, and reactive to light.  Pulmonary:     Effort: Pulmonary effort is normal.  Musculoskeletal:     Cervical back: Neck supple.  Skin:    General: Skin is warm and dry.  Neurological:     Mental Status: She is alert and oriented to person, place, and time.     No results found for this or any previous visit (from the past 24 hour(s)).  No results found.   ASSESSMENT and PLAN  1. Hypermobility arthralgia Patient has been evaluated by ortho, rheum and neuro and she has been involved with PT, suspect hypermobility disorder, referring to genetics for further testing. If positive, will investigate care thru tertiary center. - Ambulatory referral to Genetics  Return if symptoms worsen or fail to improve.    , MD [facility] [address] [place], [place] Ph.  [phone] Fax [phone]

## 2019-11-09 NOTE — Patient Instructions (Signed)
° ° ° °  If you have lab work done today you will be contacted with your lab results within the next 2 weeks.  If you have not heard from us then please contact us. The fastest way to get your results is to register for My Chart. ° ° °IF you received an x-ray today, you will receive an invoice from [facility]. Please contact [facility] at [phone] with questions or concerns regarding your invoice.  ° °IF you received labwork today, you will receive an invoice from [facility]. Please contact [facility] at [phone] with questions or concerns regarding your invoice.  ° °Our billing staff will not be able to assist you with questions regarding bills from these companies. ° °You will be contacted with the lab results as soon as they are available. The fastest way to get your results is to activate your My Chart account. Instructions are located on the last page of this paperwork. If you have not heard from us regarding the results in 2 weeks, please contact this office. °  ° ° ° °

## 2019-11-11 ENCOUNTER — Other Ambulatory Visit: Payer: Self-pay | Admitting: Family Medicine

## 2019-11-13 ENCOUNTER — Telehealth: Payer: Self-pay

## 2019-11-13 DIAGNOSIS — M2550 Pain in unspecified joint: Secondary | ICD-10-CM

## 2019-11-13 NOTE — Telephone Encounter (Signed)
Pt would like to go ahead with the PT referral despite the original conversation to hold off for a bit, pt has changed her mind. Could you please place that referral?

## 2019-11-13 NOTE — Telephone Encounter (Signed)
Pt. Called requesting referral to physical therapy by Dr. Leretha Pol.   Pt. Expressed awareness of Dr. Leretha Pol and her own previous agreement to hold off on PT until they could "figure out what was going on" but wished to continue with a PT referral if at all possible.

## 2019-11-15 NOTE — Telephone Encounter (Signed)
Please let patient know that referral has been made. thanks

## 2019-11-17 ENCOUNTER — Telehealth: Payer: Self-pay | Admitting: Family Medicine

## 2019-11-17 DIAGNOSIS — F902 Attention-deficit hyperactivity disorder, combined type: Secondary | ICD-10-CM | POA: Diagnosis not present

## 2019-11-17 DIAGNOSIS — F331 Major depressive disorder, recurrent, moderate: Secondary | ICD-10-CM | POA: Diagnosis not present

## 2019-11-17 DIAGNOSIS — F4312 Post-traumatic stress disorder, chronic: Secondary | ICD-10-CM | POA: Diagnosis not present

## 2019-11-17 DIAGNOSIS — F411 Generalized anxiety disorder: Secondary | ICD-10-CM | POA: Diagnosis not present

## 2019-11-17 NOTE — Telephone Encounter (Signed)
LVM to let pt know that Dr. Leretha Pol has placed the referral for Pt and that she should be contacted soon regarding her appt.

## 2019-11-23 ENCOUNTER — Telehealth: Payer: Self-pay

## 2019-11-23 NOTE — Telephone Encounter (Signed)
Faxed referral info request to [facility], attn to [place] [phone]

## 2019-12-04 DIAGNOSIS — F331 Major depressive disorder, recurrent, moderate: Secondary | ICD-10-CM | POA: Diagnosis not present

## 2019-12-04 DIAGNOSIS — F902 Attention-deficit hyperactivity disorder, combined type: Secondary | ICD-10-CM | POA: Diagnosis not present

## 2019-12-04 DIAGNOSIS — F411 Generalized anxiety disorder: Secondary | ICD-10-CM | POA: Diagnosis not present

## 2019-12-04 DIAGNOSIS — F4312 Post-traumatic stress disorder, chronic: Secondary | ICD-10-CM | POA: Diagnosis not present

## 2019-12-08 ENCOUNTER — Other Ambulatory Visit: Payer: Self-pay

## 2019-12-08 ENCOUNTER — Ambulatory Visit: Payer: BC Managed Care – PPO | Attending: Family Medicine | Admitting: Physical Therapy

## 2019-12-08 DIAGNOSIS — M545 Low back pain: Secondary | ICD-10-CM | POA: Insufficient documentation

## 2019-12-08 DIAGNOSIS — G8929 Other chronic pain: Secondary | ICD-10-CM | POA: Insufficient documentation

## 2019-12-08 DIAGNOSIS — M2550 Pain in unspecified joint: Secondary | ICD-10-CM | POA: Insufficient documentation

## 2019-12-08 DIAGNOSIS — M6281 Muscle weakness (generalized): Secondary | ICD-10-CM

## 2019-12-09 ENCOUNTER — Encounter: Payer: Self-pay | Admitting: Physical Therapy

## 2019-12-09 NOTE — Therapy (Signed)
[place], [place], [address] Phone: [phone]   Fax:  [phone]  Physical Therapy Evaluation  Patient Details  Name: Cassandra Schultz MRN: [id number] Date of Birth: 02-26-2001 Referring Provider (PT): Dr. Grant Fontana   Encounter Date: 12/08/2019   PT End of Session - 12/08/19 1614    Visit Number 1    Number of Visits 16    Date for PT Re-Evaluation 02/02/20    PT Start Time 7322    PT Stop Time 1615    PT Time Calculation (min) 45 min    Activity Tolerance Patient tolerated treatment well    Behavior During Therapy Anxious           Past Medical History:  Diagnosis Date  . ADHD   . Allergy   . Anxiety   . Depression    reports ADHD   . Raynaud's syndrome   . Tremor of both hands     Past Surgical History:  Procedure Laterality Date  . NO PAST SURGERIES    . WISDOM TOOTH EXTRACTION      There were no vitals filed for this visit.    Subjective Assessment - 12/08/19 1537    Subjective Patient returns to PT (last visit 2020).  She is awaiting an appt with geneticist for Marfan's Vs EDS.  She has been worked up by Neurology, Rheumatologist.  She has declined in function since being here last time.  "Exercise is not a option for me".  Pain with trying weights and lunge/squats.  Can do core exercises but that is about it.  Low back/SIJ pain, hips, knees, shoulder and ankles are all affected. She struggles with ADLs and mobility tasks at home and even uses a cane.  She expects to have a wheelchair one day.    Pertinent History scoliosis,  chronic pain, depression and anxiety, Raynaud's    Limitations Sitting;Walking;Lifting;House hold activities;Standing    How long can you stand comfortably? 5-10 min    How long can you walk comfortably? 15 min    Diagnostic tests MRI essentially normal    Patient Stated Goals Be able to exercise, have less pain .    Currently in Pain? Yes    Pain Score  5     Pain Location Generalized    Pain Descriptors / Indicators Aching;Sharp    Pain Type Chronic pain    Pain Onset More than a month ago    Pain Frequency Constant    Aggravating Factors  working, standing , lifting    Pain Relieving Factors tries to change positions.  Icy Hot. Sometimes heat and ice.  Ibuprofen. has not tried TENS.    Effect of Pain on Daily Activities unable to manage her pain , discouraged    Multiple Pain Sites Yes              [facility] PT Assessment - 12/09/19 0001      Assessment   Medical Diagnosis hypermobility arthralgia    Referring Provider (PT) Dr. Grant Fontana    Onset Date/Surgical Date --   chronic   Prior Therapy Yes       Precautions   Precautions None      Restrictions   Weight Bearing Restrictions No      Balance Screen   Has the patient fallen in the past 6 months No    Has the patient had a decrease in activity level because of a fear of falling?  No    Is the patient reluctant to leave their home because of a fear of falling?  No      Home Environment   Living Environment Private residence    Living Arrangements Non-relatives/Friends    Type of Home Apartment    Home Access Stairs to enter    Entrance Stairs-Number of Steps 3 levels , 3 flights     Additional Comments legs feel weak, needs to be careful due to knees       Prior Function   Level of Independence Independent with basic ADLs;Independent with community mobility without device    Vocation Part time employment    Vocation Requirements [employer] (4, 6 hour shifts)     Leisure has had to walk with a cane once a week due to knee instabilty      Cognition   Behaviors Other (comment)   anxious      Observation/Other Assessments   Focus on Therapeutic Outcomes (FOTO)  NT, given Bristol scale of hypermobility       Sensation   Light Touch Appears Intact      Posture/Postural Control   Posture Comments long arms, and fingers, small body frame  long thin legs       AROM   Lumbar Flexion palms to floor     Lumbar Extension 50% limited discomfort     Lumbar - Right Rotation WFL    Lumbar - Left Rotation [facility]       Strength   Right Hip Flexion 4/5    Right Hip Extension 4-/5    Right Hip ABduction 4+/5    Left Hip Flexion 4/5    Left Hip Extension 4-/5    Left Hip ABduction 4+/5    Right Knee Flexion 4+/5    Right Knee Extension 4+/5    Left Knee Flexion 4+/5    Left Knee Extension 4+/5      Palpation   Spinal mobility mid thoracic pain with P/A pressure, lumbar spine not hypermobile, WFL but T spine hypomobile     Palpation comment pain in low lumbar, lateral SI border and on L5 TP.        Special Tests   Other special tests Beighton Scale 3/9 for lumbar and bilateral knee hyperextension             Objective measurements completed on examination: See above findings.       [facility] Adult PT Treatment/Exercise - 12/09/19 0001      Self-Care   Self-Care Posture;Other Self-Care Comments    Other Self-Care Comments  bracing indications, core stability, POC and McConnell tape       Manual Therapy   McConnell L knee/patella pulled medially                   PT Education - 12/09/19 0916    Education Details PT, POC, tape, Bristol impact scale, symptom mgmt, differential diagnosis , balancing activity and rest    Person(s) Educated Patient    Methods Explanation    Comprehension Verbalized understanding;Need further instruction            PT Short Term Goals - 12/09/19 0944      PT SHORT TERM GOAL #1   Title She will be independent with inital HEP    Time 4    Period Weeks    Status New      PT SHORT TERM GOAL #2   Title Pt will be able to  use self care, tape, bracing if needed for reducing L knee instability    Time 4    Period Weeks    Status New      PT SHORT TERM GOAL #3   Title she will demo understanding of good posture and how to manage    Status New             PT Long Term Goals - 12/09/19 0946        PT LONG TERM GOAL #1   Title She will be able to demo final HEP with good technique      PT LONG TERM GOAL #2   Title Pt will be able to stand 4-5 hours with pain relieved with rest in order to perform work duties      PT LONG TERM GOAL #3   Title lfit, squat for work      PT LONG TERM GOAL #4   Title Pt will be able to hold plank position for 30 sec with no pain in back, good form to demo increased core strength      PT LONG TERM GOAL #5   Title Pt wil be able to exercise at home 30 min x 3 days per week without increased pain following in joints (muscular soreness OK)                  Plan - 12/09/19 0950    Clinical Impression Statement Patient presents for mod complexity eval of multiple joint pain, fatigue and weakness due to possible connective tissue disorder vs Generalized hypermobility.  She shows min strength deficits in hips, knees with decreased muscle and cardiovascular endurance.  She quickly mentions bracing options which we discussed.  I reinforced the need for movement and strengthening to build up to what she considers "exercise".  HEr pain response ti simple activities is out of proportion with the stimulus (walking, stairs, lifting).  I do suspect some joint hypermobiliy and instability but will be interested in her genetic testing to rule in/out a connective tissue disorder.    Personal Factors and Comorbidities Behavior Pattern;Comorbidity 1;Past/Current Experience;Time since onset of injury/illness/exacerbation    Comorbidities chronic pain, depression/anxiety, joint instability    Examination-Activity Limitations Bed Mobility;Squat;Locomotion Level;Transfers;Lift;Stand;Stairs    Examination-Participation Restrictions Community Activity;Interpersonal Relationship    Stability/Clinical Decision Making Evolving/Moderate complexity    Clinical Decision Making Moderate    Rehab Potential Good    PT Frequency 2x / week    PT Duration 8 weeks    PT  Treatment/Interventions ADLs/Self Care Home Management;Electrical Stimulation;Moist Heat;Ultrasound;Therapeutic activities;Functional mobility training;Therapeutic exercise;Patient/family education;Manual techniques;Taping;Neuromuscular re-education;Cryotherapy;Other (comment)   PNE   PT Next Visit Plan beginner HEP for small ROM stability exercises: hips, core, knee focus.  How was tape? Bristol impact scale?    PT Home Exercise Plan none at this time due to time constraints    Consulted and Agree with Plan of Care Patient           Patient will benefit from skilled therapeutic intervention in order to improve the following deficits and impairments:  Decreased activity tolerance, Decreased strength, Decreased mobility, Difficulty walking, Postural dysfunction, Pain  Visit Diagnosis: Muscle weakness (generalized)  Chronic bilateral low back pain without sciatica  Generalized joint pain     Problem List Patient Active Problem List   Diagnosis Date Noted  . Numbness and tingling of both legs - comes and goes after activity and onset of pain 08/03/2019  . Fatigue 05/27/2019  .  Low back pain 02/24/2019  . Bilateral knee pain 02/24/2019  . Bilateral hip pain 02/24/2019  . Acute bilateral ankle pain 02/24/2019  . Healthcare maintenance 12/23/2018  . Generalized anxiety disorder 04/03/2018  . Attention deficit hyperactivity disorder (ADHD), combined type, severe 04/03/2018  . Mild recurrent major depression (HCC) 04/03/2018  . Developmental coordination disorder 04/03/2018  . Allergy with anaphylaxis due to food 02/02/2016  . Allergic rhinitis due to pollen 02/02/2016    Glada Wickstrom 12/09/2019, 10:32 AM  [facility] [address] [place], [place], [address] Phone: [phone]   Fax:  [phone]  Name: Vetta Couzens MRN: [id number] Date of Birth: [birth year]   [name], PT 12/09/19 10:36 AM Phone: [phone] Fax:  [phone]

## 2019-12-15 ENCOUNTER — Other Ambulatory Visit: Payer: Self-pay

## 2019-12-15 ENCOUNTER — Ambulatory Visit: Payer: BC Managed Care – PPO | Admitting: Physical Therapy

## 2019-12-15 DIAGNOSIS — M2550 Pain in unspecified joint: Secondary | ICD-10-CM

## 2019-12-15 DIAGNOSIS — G8929 Other chronic pain: Secondary | ICD-10-CM

## 2019-12-15 DIAGNOSIS — M6281 Muscle weakness (generalized): Secondary | ICD-10-CM

## 2019-12-15 DIAGNOSIS — M545 Low back pain: Secondary | ICD-10-CM | POA: Diagnosis not present

## 2019-12-15 NOTE — Therapy (Signed)
[facility] [address] [place], [place], [address] Phone: [phone]   Fax:  [phone]  Physical Therapy Treatment  Patient Details  Name: Cassandra Schultz MRN: [id number] Date of Birth: 03-16-2001 Referring Provider (PT): Dr. Koren Shiver   Encounter Date: 12/15/2019   PT End of Session - 12/15/19 1614    Visit Number 2    Number of Visits 16    Date for PT Re-Evaluation 02/02/20    PT Start Time 1530    PT Stop Time 1609    PT Time Calculation (min) 39 min    Activity Tolerance Patient tolerated treatment well    Behavior During Therapy [facility] for tasks assessed/performed           Past Medical History:  Diagnosis Date  . ADHD   . Allergy   . Anxiety   . Depression    reports ADHD   . Raynaud's syndrome   . Tremor of both hands     Past Surgical History:  Procedure Laterality Date  . NO PAST SURGERIES    . WISDOM TOOTH EXTRACTION      There were no vitals filed for this visit.   Subjective Assessment - 12/15/19 1612    Subjective Tired but no pain today.  Worked a short shift this AM.    Currently in Pain? No/denies                             [facility] Adult PT Treatment/Exercise - 12/15/19 0001      Lumbar Exercises: Supine   AB Set Limitations 90/90 hold x 30 sec     Bent Knee Raise Limitations x 10 each holding 5 lbs inboth arms at 90 deg UE flexion     Dead Bug Limitations table top toe taps holding 5 lbs wgt.  x 10  and in hooklying to modify     Large Ball Oblique Isometric Limitations hooklying single arm anti rotation with horizontal abduction, small ROM     Other Supine Lumbar Exercises lat pull down 5 lbs x 10        Knee/Hip Exercises: Supine   Hip Adduction Isometric Strengthening;Both;1 set;5 reps    Hip Adduction Isometric Limitations 10 sec hold     Bridges Strengthening;Both;1 set;10 reps    Bridges Limitations partial bridge hold 10 sec     Straight Leg Raise with  External Rotation Strengthening;Both;1 set;10 reps    Straight Leg Raise with External Rotation Limitations small ROM hold 5 sec     Other Supine Knee/Hip Exercises isometric unilatetral clam green x 5 , 10 sec hold       Knee/Hip Exercises: Prone   Straight Leg Raises Strengthening;Both;1 set;10 reps    Straight Leg Raises Limitations all 4's small ROM hip extension       Manual Therapy   McConnell L knee/patella pulled medially                   PT Education - 12/15/19 1614    Education Details HEP, body mechanics    Person(s) Educated Patient    Methods Explanation;Demonstration;Handout    Comprehension Verbalized understanding            PT Short Term Goals - 12/09/19 0944      PT SHORT TERM GOAL #1   Title She will be independent with inital HEP    Time 4    Period Weeks  Status New    Target Date 01/05/20      PT SHORT TERM GOAL #2   Title Pt will be able to use self care, tape, bracing if needed for reducing L knee instability    Time 4    Period Weeks    Status New    Target Date 01/05/20      PT SHORT TERM GOAL #3   Title she will demo understanding of good posture and how to manage    Time 4    Period Weeks    Status New    Target Date 01/05/20             PT Long Term Goals - 12/09/19 0946      PT LONG TERM GOAL #1   Title She will be able to demo final HEP with good technique    Time 8    Period Weeks    Status New    Target Date 02/02/20      PT LONG TERM GOAL #2   Title Pt will be able to stand 4-5 hours with improved performance and reduced pain to moderate (not severe)    Time 8    Period Weeks    Status New    Target Date 02/02/20      PT LONG TERM GOAL #3   Title Pt will show good body mechanics and positioning for lifting from floor, squatting    Time 8    Period Weeks    Status New    Target Date 02/02/20      PT LONG TERM GOAL #4   Title Pt will be able to hold plank position for 30 sec with no pain in back,  good form to demo increased core strength    Time 8    Period Weeks    Status New    Target Date 02/02/20      PT LONG TERM GOAL #5   Title --                 Plan - 12/15/19 1614    Clinical Impression Statement Patient without limitations of pain, able to initiate HEP for core and hips with success.  Weakess in core evident with exercises, showing fatigue, shaking.  Kept movements small and controlled to reduce joint strain.    PT Treatment/Interventions ADLs/Self Care Home Management;Electrical Stimulation;Moist Heat;Ultrasound;Therapeutic activities;Functional mobility training;Therapeutic exercise;Patient/family education;Manual techniques;Taping;Neuromuscular re-education;Cryotherapy;Other (comment)    PT Next Visit Plan check HEP, cont stability,  Bristol impact scale?    PT Home Exercise Plan [email or web address]           Patient will benefit from skilled therapeutic intervention in order to improve the following deficits and impairments:  Decreased activity tolerance, Decreased strength, Decreased mobility, Difficulty walking, Postural dysfunction, Pain  Visit Diagnosis: Muscle weakness (generalized)  Chronic bilateral low back pain without sciatica  Generalized joint pain     Problem List Patient Active Problem List   Diagnosis Date Noted  . Numbness and tingling of both legs - comes and goes after activity and onset of pain 08/03/2019  . Fatigue 05/27/2019  . Low back pain 02/24/2019  . Bilateral knee pain 02/24/2019  . Bilateral hip pain 02/24/2019  . Acute bilateral ankle pain 02/24/2019  . Healthcare maintenance 12/23/2018  . Generalized anxiety disorder 04/03/2018  . Attention deficit hyperactivity disorder (ADHD), combined type, severe 04/03/2018  . Mild recurrent major depression (HCC) 04/03/2018  . Developmental coordination disorder 04/03/2018  .  Allergy with anaphylaxis due to food 02/02/2016  . Allergic rhinitis due to pollen 02/02/2016     Cassandra Schultz 12/15/2019, 4:17 PM  [facility] [address] [place], [place], [address] Phone: [phone]   Fax:  [phone]  Name: Cassandra Schultz MRN: [id number] Date of Birth: 31-Dec-2000 Cassandra Schultz, PT 12/15/19 4:17 PM Phone: [phone] Fax: [phone]

## 2019-12-18 ENCOUNTER — Other Ambulatory Visit (HOSPITAL_COMMUNITY)
Admission: RE | Admit: 2019-12-18 | Discharge: 2019-12-18 | Disposition: A | Discharge status: Home / Self Care | Payer: BC Managed Care – PPO | Source: Ambulatory Visit | Attending: Family Medicine | Admitting: Family Medicine

## 2019-12-18 ENCOUNTER — Encounter: Payer: Self-pay | Admitting: Family Medicine

## 2019-12-18 ENCOUNTER — Other Ambulatory Visit: Payer: Self-pay

## 2019-12-18 ENCOUNTER — Ambulatory Visit (INDEPENDENT_AMBULATORY_CARE_PROVIDER_SITE_OTHER): Payer: BC Managed Care – PPO | Admitting: Family Medicine

## 2019-12-18 VITALS — BP 113/73 | HR 77 | Temp 98.7°F | Ht 68.0 in | Wt 140.0 lb

## 2019-12-18 DIAGNOSIS — Z113 Encounter for screening for infections with a predominantly sexual mode of transmission: Secondary | ICD-10-CM | POA: Insufficient documentation

## 2019-12-18 DIAGNOSIS — M2550 Pain in unspecified joint: Secondary | ICD-10-CM | POA: Diagnosis not present

## 2019-12-18 NOTE — Patient Instructions (Signed)
° ° ° °  If you have lab work done today you will be contacted with your lab results within the next 2 weeks.  If you have not heard from us then please contact us. The fastest way to get your results is to register for My Chart. ° ° °IF you received an x-ray today, you will receive an invoice from [facility]. Please contact [facility] at [phone] with questions or concerns regarding your invoice.  ° °IF you received labwork today, you will receive an invoice from [facility]. Please contact [facility] at [phone] with questions or concerns regarding your invoice.  ° °Our billing staff will not be able to assist you with questions regarding bills from these companies. ° °You will be contacted with the lab results as soon as they are available. The fastest way to get your results is to activate your My Chart account. Instructions are located on the last page of this paperwork. If you have not heard from us regarding the results in 2 weeks, please contact this office. °  ° ° ° °

## 2019-12-18 NOTE — Progress Notes (Signed)
7/2/20214:10 PM  Cassandra Schultz [birth year], 19 y.o., female [id number]  Chief Complaint  Patient presents with  . Letter for School/Work    sue to the pain of standing  . SEXUALLY TRANSMITTED DISEASE    screening only, does not feel she has been recently exposed    HPI:   Patient is a 19 y.o. 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 genetics yet She has had 2 PT sessions, going well   She is requesting testing for gonorrhea and chlamydia Denies any symptoms Denies HIV/RPR testing as this has been done within past 6 months  Depression screen [facility] 2/9 11/09/2019 08/03/2019 05/27/2019  Decreased Interest 1 3 2   Down, Depressed, Hopeless 0 0 1  PHQ - 2 Score 1 3 3   Altered sleeping 0 3 3  Tired, decreased energy 3 3 3   Change in appetite 1 3 3   Feeling bad or failure about yourself  1 0 0  Trouble concentrating 1 2 1   Moving slowly or fidgety/restless 0 2 1  Suicidal thoughts 0 0 0  PHQ-9 Score 7 16 14   Difficult doing work/chores Somewhat difficult Very difficult Very difficult    Fall Risk  12/18/2019 08/03/2019 04/28/2019  Falls in the past year? 0 0 0  Number falls in past yr: 0 0 -  Injury with Fall? 0 0 -  Follow up - Falls evaluation completed -     Allergies  Allergen Reactions  . Other Anaphylaxis    All tree nuts, seasonal allergies dust, mold, pollen, horses, cockroaches, cats  . Peanut-Containing Drug Products Anaphylaxis  . Acyclovir And Related   . Augmentin [Amoxicillin-Pot  Clavulanate] Hives    Patient says she has no allergies to antibiotics "As far as I know"    Prior to Admission medications   Medication Sig Start Date End Date Taking? Authorizing Provider  amphetamine-dextroamphetamine (ADDERALL) 20 MG tablet Take 1 tablet by mouth 2 (two) times daily. 05/21/19  Yes [provider]  EPINEPHrine 0.3 mg/0.3 mL IJ SOAJ injection INJECT 0.3 MLS (0.3 MG TOTAL) INTO THE MUSCLE ONCE. 02/11/17  Yes Padgett, , MD  FLUoxetine (PROZAC) 20 MG capsule Take 1 capsule by mouth daily. 12/04/18  Yes [provider]  Levocetirizine Dihydrochloride (XYZAL PO) Take 10 mg by mouth daily.   Yes [provider]  SPRINTEC 28 0.25-35 MG-MCG tablet Take 1 tablet by mouth.  07/02/19  Yes [provider]  Vitamin D, Ergocalciferol, (DRISDOL) 1.25 MG (50000 UNIT) CAPS capsule Take 1 capsule (50,000 Units total) by mouth every 7 (seven) days. 08/19/19  Yes 02/13/17, DO    Past Medical History:  Diagnosis Date  . ADHD   . Allergy   . Anxiety   . Depression    reports ADHD   . Raynaud's syndrome   . Tremor of both hands     Past Surgical History:  Procedure Laterality Date  . NO PAST SURGERIES    .  WISDOM TOOTH EXTRACTION      Social History   Tobacco Use  . Smoking status: Never Smoker  . Smokeless tobacco: Never Used  Substance Use Topics  . Alcohol use: Never    Family History  Problem Relation Age of Onset  . Allergic rhinitis Mother   . Depression Mother   . Arthritis Mother   . Graves' disease Mother   . Allergic rhinitis Father   . Neuropathy Father        peripheral   . Allergic rhinitis Paternal Aunt   . Alcohol abuse Maternal Grandmother   . Depression Maternal Grandmother   . Hypothyroidism Maternal Grandmother   . Angina Maternal Grandmother   . Alcohol abuse Maternal Grandfather   . Other Paternal Grandfather        FSHD   . Angioedema Neg Hx   . Asthma Neg Hx   . Eczema Neg Hx   .  Immunodeficiency Neg Hx   . Urticaria Neg Hx     ROS Per hpi  OBJECTIVE:  Today's Vitals   12/18/19 1604  BP: 113/73  Pulse: 77  Temp: 98.7 F (37.1 C)  SpO2: 98%  Weight: 140 lb (63.5 kg)  Height: 5\' 8"  (1.727 m)   Body mass index is 21.29 kg/m.   Physical Exam Vitals and nursing note reviewed.  Constitutional:      Appearance: She is well-developed.  HENT:     Head: Normocephalic and atraumatic.  Eyes:     General: No scleral icterus.    Conjunctiva/sclera: Conjunctivae normal.     Pupils: Pupils are equal, round, and reactive to light.  Pulmonary:     Effort: Pulmonary effort is normal.  Musculoskeletal:     Cervical back: Neck supple.  Skin:    General: Skin is warm and dry.  Neurological:     Mental Status: She is alert and oriented to person, place, and time.     No results found for this or any previous visit (from the past 24 hour(s)).  No results found.   ASSESSMENT and PLAN  1. Hypermobility arthralgia Pending eval by [facility] genetics. Cont PT. Letter for work given  2. Routine screening for STI (sexually transmitted infection) - Urine cytology ancillary only  No follow-ups on file.    [facility], MD Primary Care at [facility] [address] [place], [place] Ph.  [phone] Fax [phone]

## 2019-12-23 LAB — URINE CYTOLOGY ANCILLARY ONLY
Chlamydia: NEGATIVE
Comment: NEGATIVE
Comment: NEGATIVE
Comment: NORMAL
Neisseria Gonorrhea: NEGATIVE
Trichomonas: NEGATIVE

## 2019-12-25 DIAGNOSIS — F4312 Post-traumatic stress disorder, chronic: Secondary | ICD-10-CM | POA: Diagnosis not present

## 2019-12-25 DIAGNOSIS — F411 Generalized anxiety disorder: Secondary | ICD-10-CM | POA: Diagnosis not present

## 2019-12-25 DIAGNOSIS — F331 Major depressive disorder, recurrent, moderate: Secondary | ICD-10-CM | POA: Diagnosis not present

## 2019-12-25 DIAGNOSIS — F902 Attention-deficit hyperactivity disorder, combined type: Secondary | ICD-10-CM | POA: Diagnosis not present

## 2019-12-28 ENCOUNTER — Encounter: Payer: Self-pay | Admitting: Physical Therapy

## 2019-12-28 ENCOUNTER — Other Ambulatory Visit: Payer: Self-pay

## 2019-12-28 ENCOUNTER — Ambulatory Visit: Payer: BC Managed Care – PPO | Attending: Family Medicine | Admitting: Physical Therapy

## 2019-12-28 DIAGNOSIS — M545 Low back pain: Secondary | ICD-10-CM | POA: Insufficient documentation

## 2019-12-28 DIAGNOSIS — M2550 Pain in unspecified joint: Secondary | ICD-10-CM | POA: Insufficient documentation

## 2019-12-28 DIAGNOSIS — G8929 Other chronic pain: Secondary | ICD-10-CM | POA: Insufficient documentation

## 2019-12-28 DIAGNOSIS — M6281 Muscle weakness (generalized): Secondary | ICD-10-CM | POA: Diagnosis not present

## 2019-12-28 NOTE — Patient Instructions (Signed)
Access Code: [email or web address]: [email or web address]: 07/12/2021Prepared by: Victorino Dike PaaExercises  Supine Hip Adduction Isometric with Ball - 1 x daily - 7 x weekly - 2 sets - 10 reps - 10 hold  Supine Bridge with Mini Swiss Ball Between Knees - 1 x daily - 7 x weekly - 2 sets - 10 reps - 10 hold  Hooklying Isometric Clamshell - 1 x daily - 7 x weekly - 2 sets - 10 reps - 10 hold  Supine Bridge with Resistance Band - 1 x daily - 7 x weekly - 2 sets - 10 reps - 10 hold  Small Range Straight Leg Raise - 1 x daily - 7 x weekly - 2 sets - 10 reps - 5 hold  Supine 90/90 Abdominal Bracing - 1 x daily - 7 x weekly - 1 sets - 5 reps - 30 hold

## 2019-12-28 NOTE — Therapy (Signed)
[facility] Outpatient Rehabilitation [facility] [address] [place], [place], [address] Phone: [phone]   Fax:  [phone]  Physical Therapy Treatment  Patient Details  Name: Cassandra Schultz MRN: [id number] Date of Birth: [birth year] Referring Provider (Cassandra Schultz): Dr. Koren Shiver   Encounter Date: 12/28/2019   Cassandra Schultz End of Session - 12/28/19 1135    Visit Number 3    Number of Visits 16    Date for Cassandra Schultz Re-Evaluation 02/02/20    Cassandra Schultz Start Time 1135    Cassandra Schultz Stop Time 1214    Cassandra Schultz Time Calculation (min) 39 min    Activity Tolerance Patient tolerated treatment well    Behavior During Therapy [facility] for tasks assessed/performed           Past Medical History:  Diagnosis Date  . ADHD   . Allergy   . Anxiety   . Depression    reports ADHD   . Raynaud's syndrome   . Tremor of both hands     Past Surgical History:  Procedure Laterality Date  . NO PAST SURGERIES    . WISDOM TOOTH EXTRACTION      There were no vitals filed for this visit.   Subjective Assessment - 12/28/19 1134    Subjective Neck and shoulder pain the past 3-4 days.  Cassandra Schultz. Working at [employer].    Currently in Pain? No/denies               [facility] Adult Cassandra Schultz Treatment/Exercise - 12/28/19 0001      Lumbar Exercises: Supine   Bridge with Ball Squeeze 15 reps      Lumbar Exercises: Quadruped   Single Arm Raise 10 reps    Single Arm Raise Weights (lbs) on fist with ball squeeze     Other Quadruped Lumbar Exercises child pose x 3     Other Quadruped Lumbar Exercises Q-ped core activation with ball      Knee/Hip Exercises: Aerobic   Stationary Bike 5 min L2       Knee/Hip Exercises: Standing   Wall Squat 1 set;10 reps      Knee/Hip Exercises: Supine   Hip Adduction Isometric Strengthening;Both;1 set;5 reps    Hip Adduction Isometric Limitations 10 sec hold     Bridges with Clamshell Strengthening;Both;1 set;10 reps   combo x 5, rest  each time    Straight Leg Raise with External Rotation Strengthening;Both;1 set;10 reps    Straight Leg Raise with External Rotation Limitations small ROM hold 5 sec     Other Supine Knee/Hip Exercises small ROM x 10, 5 sec hold green band hip abd/ER                  Cassandra Schultz Education - 12/28/19 1542    Education Details upgrade HEP to increase ROM    Person(s) Educated Patient    Methods Explanation;Demonstration    Comprehension Verbalized understanding;Returned demonstration            Cassandra Schultz Short Term Goals - 12/28/19 1542      Cassandra Schultz SHORT TERM GOAL #1   Title Cassandra Schultz will be independent with inital HEP    Status Achieved      Cassandra Schultz SHORT TERM GOAL #2   Title Cassandra Schultz will be able to use self care, tape, bracing if needed for reducing L knee instability    Baseline no mention of knee pain today    Status On-going  Cassandra Schultz SHORT TERM GOAL #3   Title Cassandra Schultz will demo understanding of good posture and how to manage    Status On-going             Cassandra Schultz Long Term Goals - 12/28/19 1545      Cassandra Schultz LONG TERM GOAL #1   Title Cassandra Schultz will be able to demo final HEP with good technique    Status On-going      Cassandra Schultz LONG TERM GOAL #2   Title Cassandra Schultz will be able to stand 4-5 hours with improved performance and reduced pain to moderate (Schultz severe)    Status On-going      Cassandra Schultz LONG TERM GOAL #3   Title Cassandra Schultz will show good body mechanics and positioning for lifting from floor, squatting    Status On-going      Cassandra Schultz LONG TERM GOAL #4   Title Cassandra Schultz will be able to hold plank position for 30 sec with no pain in back, good form to demo increased core strength    Status On-going      Cassandra Schultz LONG TERM GOAL #5   Title Bristol Impact score will improve by 15% which shows reduced impact of hypermobility on ADLs, IADLs    Status On-going                 Plan - 12/28/19 1208    Clinical Impression Statement Good execution of small ROM stabilization exercises despite LE soreness, fatigue. Cassandra Schultz properly engages core and  maintains control of her limb motions.  Does Schultz tolerate increased reps or additonal exercises well, however, fatigues easily.    Cassandra Schultz Treatment/Interventions ADLs/Self Care Home Management;Electrical Stimulation;Moist Heat;Ultrasound;Therapeutic activities;Functional mobility training;Therapeutic exercise;Patient/family education;Manual techniques;Taping;Neuromuscular re-education;Cryotherapy;Other (comment)    Cassandra Schultz Next Visit Plan cont stability,  Bristol impact scale score done , 65% impaired    Cassandra Schultz Home Exercise Plan [facility]           Patient will benefit from skilled therapeutic intervention in order to improve the following deficits and impairments:  Decreased activity tolerance, Decreased strength, Decreased mobility, Difficulty walking, Postural dysfunction, Pain  Visit Diagnosis: Muscle weakness (generalized)  Chronic bilateral low back pain without sciatica  Generalized joint pain     Problem List Patient Active Problem List   Diagnosis Date Noted  . Numbness and tingling of both legs - comes and goes after activity and onset of pain 08/03/2019  . Fatigue 05/27/2019  . Low back pain 02/24/2019  . Bilateral knee pain 02/24/2019  . Bilateral hip pain 02/24/2019  . Acute bilateral ankle pain 02/24/2019  . Healthcare maintenance 12/23/2018  . Generalized anxiety disorder 04/03/2018  . Attention deficit hyperactivity disorder (ADHD), combined type, severe 04/03/2018  . Mild recurrent major depression (HCC) 04/03/2018  . Developmental coordination disorder 04/03/2018  . Allergy with anaphylaxis due to food 02/02/2016  . Allergic rhinitis due to pollen 02/02/2016    Axelle Szwed 12/28/2019, 4:06 PM  [facility] [address] [place], [place], [address] Phone: [phone]   Fax:  [phone]  Name: Myleka Moncure MRN: [id number] Date of Birth: 2001-01-17  [name], Cassandra Schultz 12/28/19 4:06 PM Phone: [phone] Fax:  [phone]

## 2019-12-30 ENCOUNTER — Telehealth: Payer: Self-pay | Admitting: Physical Therapy

## 2019-12-30 ENCOUNTER — Ambulatory Visit: Payer: BC Managed Care – PPO | Admitting: Physical Therapy

## 2019-12-30 DIAGNOSIS — J029 Acute pharyngitis, unspecified: Secondary | ICD-10-CM | POA: Diagnosis not present

## 2019-12-30 DIAGNOSIS — R509 Fever, unspecified: Secondary | ICD-10-CM | POA: Diagnosis not present

## 2019-12-30 DIAGNOSIS — Z03818 Encounter for observation for suspected exposure to other biological agents ruled out: Secondary | ICD-10-CM | POA: Diagnosis not present

## 2019-12-30 NOTE — Telephone Encounter (Signed)
Called patient regarding her missed appt today for PT at 11:30. She reports she is sick, thinks she may have the flu.  Reminded of her next appt next week 7/20, 2:00.   [name], PT 12/30/19 12:14 PM Phone: [phone] Fax: [phone]

## 2020-01-05 ENCOUNTER — Encounter: Payer: Self-pay | Admitting: Physical Therapy

## 2020-01-05 ENCOUNTER — Other Ambulatory Visit: Payer: Self-pay

## 2020-01-05 ENCOUNTER — Ambulatory Visit: Payer: BC Managed Care – PPO | Admitting: Physical Therapy

## 2020-01-05 DIAGNOSIS — M6281 Muscle weakness (generalized): Secondary | ICD-10-CM | POA: Diagnosis not present

## 2020-01-05 DIAGNOSIS — M2550 Pain in unspecified joint: Secondary | ICD-10-CM | POA: Diagnosis not present

## 2020-01-05 DIAGNOSIS — M545 Low back pain: Secondary | ICD-10-CM | POA: Diagnosis not present

## 2020-01-05 DIAGNOSIS — G8929 Other chronic pain: Secondary | ICD-10-CM | POA: Diagnosis not present

## 2020-01-05 NOTE — Therapy (Signed)
[facility] Outpatient Rehabilitation [facility] [address] [place], [place], [address] Phone: [phone]   Fax:  [phone]  Physical Therapy Treatment  Patient Details  Name: Cassandra Schultz MRN: [id number] Date of Birth: June 29, 2000 Referring Provider (PT): Dr. Koren Shiver   Encounter Date: 01/05/2020   PT End of Session - 01/05/20 1439    Visit Number 4    Number of Visits 16    Date for PT Re-Evaluation 02/02/20    PT Start Time 1402    PT Stop Time 1442    PT Time Calculation (min) 40 min    Activity Tolerance Patient tolerated treatment well;Patient limited by fatigue    Behavior During Therapy [facility] for tasks assessed/performed           Past Medical History:  Diagnosis Date  . ADHD   . Allergy   . Anxiety   . Depression    reports ADHD   . Raynaud's syndrome   . Tremor of both hands     Past Surgical History:  Procedure Laterality Date  . NO PAST SURGERIES    . WISDOM TOOTH EXTRACTION      There were no vitals filed for this visit.     [facility] Adult PT Treatment/Exercise - 01/05/20 0001      Lumbar Exercises: Supine   Pelvic Tilt 10 reps    Pelvic Tilt Limitations over ball     Clam 10 reps    Clam Limitations alternating     Bent Knee Raise 10 reps    Bent Knee Raise Limitations on ball and off, used hands to frame tailbone     Dead Bug 10 reps    Dead Bug Limitations on ball     Basic Lumbar Stabilization Limitations used props- SLR from table top     Straight Leg Raise 10 reps    Straight Leg Raises Limitations ball under T spine     Other Supine Lumbar Exercises upper abd curl ball under thoracic spine       Lumbar Exercises: Sidelying   Other Sidelying Lumbar Exercises sideplank x 3x 15 sec      Lumbar Exercises: Prone   Other Prone Lumbar Exercises plank on elbows x 20 sec       Knee/Hip Exercises: Standing   Functional Squat Limitations 15 lbs KB x 15 and dead lift x 15     Other Standing Knee Exercises TRX squats  x10 , double and single leg     Other Standing Knee Exercises Warrior III hip hinge with TRX x 5 slow reps each side                     PT Short Term Goals - 01/05/20 1415      PT SHORT TERM GOAL #1   Title She will be independent with inital HEP    Status Achieved      PT SHORT TERM GOAL #2   Title Pt will be able to use self care, tape, bracing if needed for reducing L knee instability    Baseline keeps brace at wears when she needs to.  Working 4 hours so using it less      PT SHORT TERM GOAL #3   Title she will demo understanding of good posture and how to manage    Status Achieved             PT Long Term Goals - 12/28/19 1545      PT  LONG TERM GOAL #1   Title She will be able to demo final HEP with good technique    Status On-going      PT LONG TERM GOAL #2   Title Pt will be able to stand 4-5 hours with improved performance and reduced pain to moderate (not severe)    Status On-going      PT LONG TERM GOAL #3   Title Pt will show good body mechanics and positioning for lifting from floor, squatting    Status On-going      PT LONG TERM GOAL #4   Title Pt will be able to hold plank position for 30 sec with no pain in back, good form to demo increased core strength    Status On-going      PT LONG TERM GOAL #5   Title Bristol Impact score will improve by 15% which shows reduced impact of hypermobility on ADLs, IADLs    Status On-going                 Plan - 01/05/20 1426    Clinical Impression Statement Patient has had a virus, recovering but has not done her HEP. She shows good form with squats and deadlift. Has good control.  Gait abnormal with L sided pelvic rotation noticed end of session.  Rt LE weaker with small ROM single leg squats. Tolerated exercises well today.    PT Treatment/Interventions ADLs/Self Care Home Management;Electrical Stimulation;Moist Heat;Ultrasound;Therapeutic activities;Functional mobility training;Therapeutic  exercise;Patient/family education;Manual techniques;Taping;Neuromuscular re-education;Cryotherapy;Other (comment)    PT Next Visit Plan cont stability , increase load    PT Home Exercise Plan [email or web address]    Consulted and Agree with Plan of Care Patient           Patient will benefit from skilled therapeutic intervention in order to improve the following deficits and impairments:  Decreased activity tolerance, Decreased strength, Decreased mobility, Difficulty walking, Postural dysfunction, Pain  Visit Diagnosis: Muscle weakness (generalized)  Chronic bilateral low back pain without sciatica  Generalized joint pain     Problem List Patient Active Problem List   Diagnosis Date Noted  . Numbness and tingling of both legs - comes and goes after activity and onset of pain 08/03/2019  . Fatigue 05/27/2019  . Low back pain 02/24/2019  . Bilateral knee pain 02/24/2019  . Bilateral hip pain 02/24/2019  . Acute bilateral ankle pain 02/24/2019  . Healthcare maintenance 12/23/2018  . Generalized anxiety disorder 04/03/2018  . Attention deficit hyperactivity disorder (ADHD), combined type, severe 04/03/2018  . Mild recurrent major depression (HCC) 04/03/2018  . Developmental coordination disorder 04/03/2018  . Allergy with anaphylaxis due to food 02/02/2016  . Allergic rhinitis due to pollen 02/02/2016    Cassandra Schultz 01/05/2020, 2:43 PM  [facility] [address] [place], [place], [address] Phone: [phone]   Fax:  [phone]  Name: Cassandra Schultz MRN: [id number] Date of Birth: 11/01/2000  [name], PT 01/05/20 2:44 PM Phone: [phone] Fax: [phone]

## 2020-01-07 ENCOUNTER — Other Ambulatory Visit: Payer: Self-pay

## 2020-01-07 ENCOUNTER — Ambulatory Visit: Payer: BC Managed Care – PPO | Admitting: Physical Therapy

## 2020-01-07 ENCOUNTER — Encounter: Payer: Self-pay | Admitting: Physical Therapy

## 2020-01-07 DIAGNOSIS — M6281 Muscle weakness (generalized): Secondary | ICD-10-CM | POA: Diagnosis not present

## 2020-01-07 DIAGNOSIS — M2550 Pain in unspecified joint: Secondary | ICD-10-CM | POA: Diagnosis not present

## 2020-01-07 DIAGNOSIS — G8929 Other chronic pain: Secondary | ICD-10-CM

## 2020-01-07 DIAGNOSIS — M545 Low back pain: Secondary | ICD-10-CM | POA: Diagnosis not present

## 2020-01-07 NOTE — Therapy (Signed)
[facility] Outpatient Rehabilitation [facility] [address] [place], [place], [address] Phone: [phone]   Fax:  [phone]  Physical Therapy Treatment  Patient Details  Name: Cassandra Schultz MRN: [id number] Date of Birth: 18-Sep-2000 Referring Provider (PT): Dr. Koren Shiver   Encounter Date: 01/07/2020   PT End of Session - 01/07/20 1411    Visit Number 5    Number of Visits 16    Date for PT Re-Evaluation 02/02/20    PT Start Time 1400    PT Stop Time 1446    PT Time Calculation (min) 46 min    Activity Tolerance Patient tolerated treatment well    Behavior During Therapy [facility] for tasks assessed/performed           Past Medical History:  Diagnosis Date  . ADHD   . Allergy   . Anxiety   . Depression    reports ADHD   . Raynaud's syndrome   . Tremor of both hands     Past Surgical History:  Procedure Laterality Date  . NO PAST SURGERIES    . WISDOM TOOTH EXTRACTION      There were no vitals filed for this visit.   Subjective Assessment - 01/07/20 1410    Subjective Back pain today, 2/10-3/10.  Was sitting, volunteering today.    Currently in Pain? Yes    Pain Location Back               [facility] Adult PT Treatment/Exercise - 01/07/20 0001      Pilates   Pilates Tower see note       Knee/Hip Exercises: Aerobic   Elliptical 5 min L 5 L 1 resist            Pilates Tower for LE/Core strength, postural strength, lumbopelvic disassociation and core control.  Exercises included:  [place] slastix arcs in parallel and then T press x 8 each  Added bridge with arm press x 8   Roll down yellow press down x 5 , added partial and then full roll down x 5   Supine Leg springs  Single leg yellow spring arcs x 8 each leg , smaller ROM   Double leg circles and squats in parallel and then in turnout   Single leg sidelying springs yellow a  Adduction and abduction  Circles modified for spring tension , like to keep knee slightly bent  for joint protection   Patient with excellent breathing techniques and focus on stability and alignment throughout session. Less reps available due to fatigue          PT Short Term Goals - 01/05/20 1415      PT SHORT TERM GOAL #1   Title She will be independent with inital HEP    Status Achieved      PT SHORT TERM GOAL #2   Title Pt will be able to use self care, tape, bracing if needed for reducing L knee instability    Baseline keeps brace at wears when she needs to.  Working 4 hours so using it less      PT SHORT TERM GOAL #3   Title she will demo understanding of good posture and how to manage    Status Achieved             PT Long Term Goals - 12/28/19 1545      PT LONG TERM GOAL #1   Title She will be able to demo final HEP with good technique  Status On-going      PT LONG TERM GOAL #2   Title Pt will be able to stand 4-5 hours with improved performance and reduced pain to moderate (not severe)    Status On-going      PT LONG TERM GOAL #3   Title Pt will show good body mechanics and positioning for lifting from floor, squatting    Status On-going      PT LONG TERM GOAL #4   Title Pt will be able to hold plank position for 30 sec with no pain in back, good form to demo increased core strength    Status On-going      PT LONG TERM GOAL #5   Title Bristol Impact score will improve by 15% which shows reduced impact of hypermobility on ADLs, IADLs    Status On-going                 Plan - 01/07/20 1556    Clinical Impression Statement Pt with min low back pain.  Able to use Tower (Pilates) for efficient stabilization of upper and lower core. Min knee pain post.  Needed modifed spring due to weakness in limbs.  She is interested in pilates mat classes, given info on classes appropriate for her limitations.    PT Treatment/Interventions ADLs/Self Care Home Management;Electrical Stimulation;Moist Heat;Ultrasound;Therapeutic activities;Functional mobility  training;Therapeutic exercise;Patient/family education;Manual techniques;Taping;Neuromuscular re-education;Cryotherapy;Other (comment)    PT Next Visit Plan cont stability , increase load    PT Home Exercise Plan [id number]    Consulted and Agree with Plan of Care Patient           Patient will benefit from skilled therapeutic intervention in order to improve the following deficits and impairments:  Decreased activity tolerance, Decreased strength, Decreased mobility, Difficulty walking, Postural dysfunction, Pain  Visit Diagnosis: Muscle weakness (generalized)  Chronic bilateral low back pain without sciatica  Generalized joint pain     Problem List Patient Active Problem List   Diagnosis Date Noted  . Numbness and tingling of both legs - comes and goes after activity and onset of pain 08/03/2019  . Fatigue 05/27/2019  . Low back pain 02/24/2019  . Bilateral knee pain 02/24/2019  . Bilateral hip pain 02/24/2019  . Acute bilateral ankle pain 02/24/2019  . Healthcare maintenance 12/23/2018  . Generalized anxiety disorder 04/03/2018  . Attention deficit hyperactivity disorder (ADHD), combined type, severe 04/03/2018  . Mild recurrent major depression (HCC) 04/03/2018  . Developmental coordination disorder 04/03/2018  . Allergy with anaphylaxis due to food 02/02/2016  . Allergic rhinitis due to pollen 02/02/2016    Cassandra Schultz 01/07/2020, 4:00 PM  [facility] [address] [place], [place], [address] Phone: [phone]   Fax:  [phone]  Name: Cassandra Schultz MRN: [id number] Date of Birth: 07-Jul-2000  [name], PT 01/07/20 4:00 PM Phone: [phone] Fax: [phone]

## 2020-01-08 DIAGNOSIS — F331 Major depressive disorder, recurrent, moderate: Secondary | ICD-10-CM | POA: Diagnosis not present

## 2020-01-08 DIAGNOSIS — F4312 Post-traumatic stress disorder, chronic: Secondary | ICD-10-CM | POA: Diagnosis not present

## 2020-01-08 DIAGNOSIS — F411 Generalized anxiety disorder: Secondary | ICD-10-CM | POA: Diagnosis not present

## 2020-01-08 DIAGNOSIS — F902 Attention-deficit hyperactivity disorder, combined type: Secondary | ICD-10-CM | POA: Diagnosis not present

## 2020-01-14 ENCOUNTER — Ambulatory Visit: Payer: BC Managed Care – PPO | Admitting: Physical Therapy

## 2020-01-14 ENCOUNTER — Other Ambulatory Visit: Payer: Self-pay

## 2020-01-14 DIAGNOSIS — M6281 Muscle weakness (generalized): Secondary | ICD-10-CM

## 2020-01-14 DIAGNOSIS — G8929 Other chronic pain: Secondary | ICD-10-CM | POA: Diagnosis not present

## 2020-01-14 DIAGNOSIS — M2550 Pain in unspecified joint: Secondary | ICD-10-CM | POA: Diagnosis not present

## 2020-01-14 DIAGNOSIS — M545 Low back pain: Secondary | ICD-10-CM | POA: Diagnosis not present

## 2020-01-14 NOTE — Therapy (Signed)
[facility] Outpatient Rehabilitation [facility] [address] [place], [place], [address] Phone: [phone]   Fax:  [phone]  Physical Therapy Treatment  Patient Details  Name: Cassandra Schultz MRN: [id number] Date of Birth: Oct 30, 2000 Referring Provider (PT): Dr. Koren Shiver   Encounter Date: 01/14/2020   PT End of Session - 01/14/20 1633    Visit Number 6    Number of Visits 16    Date for PT Re-Evaluation 02/02/20    PT Start Time 1624    PT Stop Time 1700    PT Time Calculation (min) 36 min    Activity Tolerance Patient tolerated treatment well    Behavior During Therapy [facility] for tasks assessed/performed           Past Medical History:  Diagnosis Date  . ADHD   . Allergy   . Anxiety   . Depression    reports ADHD   . Raynaud's syndrome   . Tremor of both hands     Past Surgical History:  Procedure Laterality Date  . NO PAST SURGERIES    . WISDOM TOOTH EXTRACTION      There were no vitals filed for this visit.   Subjective Assessment - 01/14/20 1627    Subjective I'm really tired.  My hip and knee were hurting today at work. My legs were burning walking up to the clinic.  Past 4 days have been really bad.    Currently in Pain? No/denies             [facility] Adult PT Treatment/Exercise - 01/14/20 0001      Lumbar Exercises: Supine   AB Set Limitations 90/90 press with hands x 5 each side    unable, too painful    Large Ball Oblique Isometric Limitations opposite arm/opposite leg x 5 each side as modification     Other Supine Lumbar Exercises low back release intermittently between sets       Knee/Hip Exercises: Stretches   Active Hamstring Stretch Both;3 reps    Piriformis Stretch Both;2 reps    Piriformis Stretch Limitations seated fig 4       Knee/Hip Exercises: Supine   Hip Adduction Isometric Limitations x 10     Bridges with [employer] 10 reps      Knee/Hip Exercises: Sidelying   Hip ABduction Strengthening;Both;1 set;10  reps    Clams x 10                   PT Education - 01/14/20 1635    Education Details exercise to combat fatigue and depression    Person(s) Educated Patient    Methods Explanation;Demonstration    Comprehension Verbalized understanding;Returned demonstration            PT Short Term Goals - 01/05/20 1415      PT SHORT TERM GOAL #1   Title She will be independent with inital HEP    Status Achieved      PT SHORT TERM GOAL #2   Title Pt will be able to use self care, tape, bracing if needed for reducing L knee instability    Baseline keeps brace at wears when she needs to.  Working 4 hours so using it less      PT SHORT TERM GOAL #3   Title she will demo understanding of good posture and how to manage    Status Achieved             PT Long Term Goals -  12/28/19 1545      PT LONG TERM GOAL #1   Title She will be able to demo final HEP with good technique    Status On-going      PT LONG TERM GOAL #2   Title Pt will be able to stand 4-5 hours with improved performance and reduced pain to moderate (not severe)    Status On-going      PT LONG TERM GOAL #3   Title Pt will show good body mechanics and positioning for lifting from floor, squatting    Status On-going      PT LONG TERM GOAL #4   Title Pt will be able to hold plank position for 30 sec with no pain in back, good form to demo increased core strength    Status On-going      PT LONG TERM GOAL #5   Title Bristol Impact score will improve by 15% which shows reduced impact of hypermobility on ADLs, IADLs    Status On-going                 Plan - 01/14/20 1644    Clinical Impression Statement Patient with limited ability to complete exercises, reporting extreme fatigue asociated with EDS vs depression. She naps frequently and often stays in bed on her days off. Worked on HEP today as she has not done in many days.    PT Treatment/Interventions ADLs/Self Care Home Management;Electrical  Stimulation;Moist Heat;Ultrasound;Therapeutic activities;Functional mobility training;Therapeutic exercise;Patient/family education;Manual techniques;Taping;Neuromuscular re-education;Cryotherapy;Other (comment)    PT Next Visit Plan cont stability , increase load    PT Home Exercise Plan [email or web address]    Consulted and Agree with Plan of Care Patient           Patient will benefit from skilled therapeutic intervention in order to improve the following deficits and impairments:  Decreased activity tolerance, Decreased strength, Decreased mobility, Difficulty walking, Postural dysfunction, Pain  Visit Diagnosis: Muscle weakness (generalized)  Chronic bilateral low back pain without sciatica  Generalized joint pain     Problem List Patient Active Problem List   Diagnosis Date Noted  . Numbness and tingling of both legs - comes and goes after activity and onset of pain 08/03/2019  . Fatigue 05/27/2019  . Low back pain 02/24/2019  . Bilateral knee pain 02/24/2019  . Bilateral hip pain 02/24/2019  . Acute bilateral ankle pain 02/24/2019  . Healthcare maintenance 12/23/2018  . Generalized anxiety disorder 04/03/2018  . Attention deficit hyperactivity disorder (ADHD), combined type, severe 04/03/2018  . Mild recurrent major depression (HCC) 04/03/2018  . Developmental coordination disorder 04/03/2018  . Allergy with anaphylaxis due to food 02/02/2016  . Allergic rhinitis due to pollen 02/02/2016    Cassandra Schultz 01/14/2020, 6:08 PM  [facility] [address] [place], [place], [address] Phone: [phone]   Fax:  [phone]  Name: Cassandra Schultz MRN: [id number] Date of Birth: 02-25-2001  [name], PT 01/14/20 6:08 PM Phone: [phone] Fax: [phone]

## 2020-02-05 ENCOUNTER — Other Ambulatory Visit: Payer: Self-pay

## 2020-02-05 ENCOUNTER — Encounter: Payer: Self-pay | Admitting: Family Medicine

## 2020-02-05 ENCOUNTER — Telehealth (INDEPENDENT_AMBULATORY_CARE_PROVIDER_SITE_OTHER): Payer: BC Managed Care – PPO | Admitting: Family Medicine

## 2020-02-05 DIAGNOSIS — M2550 Pain in unspecified joint: Secondary | ICD-10-CM | POA: Diagnosis not present

## 2020-02-05 NOTE — Progress Notes (Signed)
Virtual Visit Note  I connected with patient on 02/05/20 at 1131am by video epic and verified that I am speaking with the correct person using two identifiers. Cassandra Schultz is currently located in car and patient is currently with them during visit. The provider, Myles Lipps, MD is located in their office at time of visit.  I discussed the limitations, risks, security and privacy concerns of performing an evaluation and management service by telephone and the availability of in person appointments. I also discussed with the patient that there may be a patient responsible charge related to this service. The patient expressed understanding and agreed to proceed.   I provided 13 minutes of non-face-to-face time during this encounter.  CC: hypermobility  HPI ? Patient last seen by me in July 2021 - referred to [facility] - next appt available in 1 year She has been also doing PT, therapist recommend hypermobility specialist in [place], Dr Tamela Gammon unfortunetaly he is not taking any new patients She did print out form from his website regarding diagnosis criteria and is wondering if I would be willing to assess She will also need work accomodations for work to use a wheelchair or take time off prn pain She will scan in forms via mychart  Allergies  Allergen Reactions  . Other Anaphylaxis    All tree nuts, seasonal allergies dust, mold, pollen, horses, cockroaches, cats  . Peanut-Containing Drug Products Anaphylaxis  . Acyclovir And Related   . Augmentin [Amoxicillin-Pot Clavulanate] Hives    Patient says she has no allergies to antibiotics "As far as I know"    Prior to Admission medications   Medication Sig Start Date End Date Taking? Authorizing Provider  amphetamine-dextroamphetamine (ADDERALL) 20 MG tablet Take 1 tablet by mouth 2 (two) times daily. 05/21/19  Yes [provider]  EPINEPHrine 0.3 mg/0.3 mL IJ SOAJ injection INJECT 0.3 MLS (0.3 MG TOTAL) INTO THE  MUSCLE ONCE. 02/11/17  Yes Padgett, Pilar Grammes, MD  FLUoxetine (PROZAC) 20 MG capsule Take 1 capsule by mouth daily. 12/04/18  Yes [provider]  Levocetirizine Dihydrochloride (XYZAL PO) Take 10 mg by mouth daily.   Yes [provider]  SPRINTEC 28 0.25-35 MG-MCG tablet Take 1 tablet by mouth.  07/02/19  Yes [provider]    Past Medical History:  Diagnosis Date  . ADHD   . Allergy   . Anxiety   . Depression    reports ADHD   . Raynaud's syndrome   . Tremor of both hands     Past Surgical History:  Procedure Laterality Date  . NO PAST SURGERIES    . WISDOM TOOTH EXTRACTION      Social History   Tobacco Use  . Smoking status: Never Smoker  . Smokeless tobacco: Never Used  Substance Use Topics  . Alcohol use: Never    Family History  Problem Relation Age of Onset  . Allergic rhinitis Mother   . Depression Mother   . Arthritis Mother   . Graves' disease Mother   . Allergic rhinitis Father   . Neuropathy Father        peripheral   . Allergic rhinitis Paternal Aunt   . Alcohol abuse Maternal Grandmother   . Depression Maternal Grandmother   . Hypothyroidism Maternal Grandmother   . Angina Maternal Grandmother   . Alcohol abuse Maternal Grandfather   . Other Paternal Grandfather        FSHD   . Angioedema Neg Hx   .  Asthma Neg Hx   . Eczema Neg Hx   . Immunodeficiency Neg Hx   . Urticaria Neg Hx     ROS Per hpi  Objective  Vitals as reported by the patient: none   ASSESSMENT and PLAN  1. Hypermobility arthralgia Discussed with patient that I do not feel comfortable with preforming Beighton scale and therefore would not be able to assess appropriately. I will reach out to sports medicine and see if they might be a source for assessment.  Will complete work accomodation paperwork while she continues to be in process of assessment.  FOLLOW-UP: prn   The above assessment and management plan was discussed with the patient.  The patient verbalized understanding of and has agreed to the management plan. Patient is aware to call the clinic if symptoms persist or worsen. Patient is aware when to return to the clinic for a follow-up visit. Patient educated on when it is appropriate to go to the emergency department.     Myles Lipps, MD Primary Care at [facility] [address] [place], [place] [address] Ph.  [phone] Fax [phone]

## 2020-02-05 NOTE — Patient Instructions (Signed)
° ° ° °  If you have lab work done today you will be contacted with your lab results within the next 2 weeks.  If you have not heard from us then please contact us. The fastest way to get your results is to register for My Chart. ° ° °IF you received an x-ray today, you will receive an invoice from [facility]. Please contact [facility] at [phone] with questions or concerns regarding your invoice.  ° °IF you received labwork today, you will receive an invoice from [facility]. Please contact [facility] at [phone] with questions or concerns regarding your invoice.  ° °Our billing staff will not be able to assist you with questions regarding bills from these companies. ° °You will be contacted with the lab results as soon as they are available. The fastest way to get your results is to activate your My Chart account. Instructions are located on the last page of this paperwork. If you have not heard from us regarding the results in 2 weeks, please contact this office. °  ° ° ° °

## 2020-02-08 ENCOUNTER — Encounter: Payer: Self-pay | Admitting: Physical Therapy

## 2020-02-08 ENCOUNTER — Ambulatory Visit: Payer: BC Managed Care – PPO | Attending: Family Medicine | Admitting: Physical Therapy

## 2020-02-08 ENCOUNTER — Other Ambulatory Visit: Payer: Self-pay

## 2020-02-08 DIAGNOSIS — G8929 Other chronic pain: Secondary | ICD-10-CM | POA: Insufficient documentation

## 2020-02-08 DIAGNOSIS — M6281 Muscle weakness (generalized): Secondary | ICD-10-CM

## 2020-02-08 DIAGNOSIS — M2550 Pain in unspecified joint: Secondary | ICD-10-CM

## 2020-02-08 DIAGNOSIS — M545 Low back pain, unspecified: Secondary | ICD-10-CM

## 2020-02-08 NOTE — Therapy (Signed)
[facility] [address] [place], [place], [address] Phone: [phone]   Fax:  [phone]  Physical Therapy Treatment/Renewal   Patient Details  Name: Cassandra Schultz MRN: [id number] Date of Birth: July 18, 2000 Referring Provider (PT): Dr. Koren Shiver   Encounter Date: 02/08/2020   PT End of Session - 02/08/20 0842    Visit Number 7    Number of Visits 16    Date for PT Re-Evaluation 03/29/20    PT Start Time 0839    PT Stop Time 0915    PT Time Calculation (min) 36 min    Activity Tolerance Patient tolerated treatment well    Behavior During Therapy [facility] for tasks assessed/performed           Past Medical History:  Diagnosis Date  . ADHD   . Allergy   . Anxiety   . Depression    reports ADHD   . Raynaud's syndrome   . Tremor of both hands     Past Surgical History:  Procedure Laterality Date  . NO PAST SURGERIES    . WISDOM TOOTH EXTRACTION      There were no vitals filed for this visit.   Subjective Assessment - 02/08/20 0843    Subjective This past week my legs have been hurting more.  No pain right now. Saw the doctor about getting EDS diagnosed but she was unable.  Will refer her to someone who can. I cant work ful time, its getting harder and harder to work 6 hourshifts.    Pertinent History scoliosis,  chronic pain, depression and anxiety, Raynaud's    Limitations Sitting;Walking;Lifting;House hold activities;Standing    Currently in Pain? No/denies              [facility] PT Assessment - 02/08/20 0001      Assessment   Medical Diagnosis hypermobility arthralgia    Referring Provider (PT) Dr. Koren Shiver    Onset Date/Surgical Date --   chronic   Prior Therapy Yes       Precautions   Precautions None      Restrictions   Weight Bearing Restrictions No      Home Environment   Living Environment Private residence    Living Arrangements Non-relatives/Friends    Type of Home Apartment     Home Access Stairs to enter    Entrance Stairs-Number of Steps 3 levels , 3 flights     Additional Comments legs feel weak, needs to be careful due to knees       Prior Function   Level of Independence Independent with basic ADLs;Independent with community mobility without device    Vocation Part time employment      AROM   Lumbar Flexion palms to floor       Strength   Right Hip Flexion 5/5    Right Hip Extension 4+/5    Right Hip ABduction 4+/5    Right Hip ADduction 3+/5    Left Hip Flexion 5/5    Left Hip Extension 4+/5    Left Hip ABduction 4+/5    Left Hip ADduction 3+/5    Right Knee Flexion 5/5    Right Knee Extension 5/5    Left Knee Flexion 5/5    Left Knee Extension 5/5      Flexibility   Soft Tissue Assessment /Muscle Length --   hamstrings and hip Er WNL.  IR excessive  [facility] Adult PT Treatment/Exercise - 02/08/20 0001      Lumbar Exercises: Quadruped   Plank plank modified and added core challenge       Knee/Hip Exercises: Aerobic   Stationary Bike 6 min L1 for warm up       Knee/Hip Exercises: Standing   Wall Squat 2 sets;15 reps    Wall Squat Limitations 3rd set hold 5 sec x 5 with ball     Other Standing Knee Exercises step ups for 1 min x 3 intervals, 30 sec between sets    HR up to 153 bpm                   PT Short Term Goals - 02/08/20 0846      PT SHORT TERM GOAL #1   Title She will be independent with inital HEP    Status Achieved      PT SHORT TERM GOAL #2   Title Pt will be able to use self care, tape, bracing if needed for reducing L knee instability    Baseline keeps brace at wears when she needs to.  Can Tape her knee if she needs to.    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    Status On-going      PT LONG TERM GOAL #3   Title Pt will show good body mechanics and positioning for lifting from floor, squatting    Status On-going      PT LONG TERM GOAL #4   Title Pt will be able to hold plank position for 30 sec with no pain in back, good form to demo increased core strength    Status On-going      PT LONG TERM GOAL #5   Title Bristol Impact score will improve by 15% which shows reduced impact of hypermobility on ADLs, IADLs    Status On-going                 Plan - 02/08/20 1430    Clinical Impression Statement Patient has not been to PT in several weeks.  Overall she is a bit better but continues to struggle with working > 4 hour shifts and walking, being on her feet.  She has diffuse joint and muscle soreness. SHe does show increased LE strength.  Would like to attend PT every 2 weeks for accountability and input on exercise and self care techniques.    Personal Factors and Comorbidities Behavior Pattern;Comorbidity 1;Past/Current Experience;Time since onset of injury/illness/exacerbation    Comorbidities chronic pain, depression/anxiety, joint instability    Examination-Activity Limitations Bed Mobility;Squat;Locomotion Level;Transfers;Lift;Stand;Stairs    Examination-Participation Restrictions Community Activity;Interpersonal Relationship    Stability/Clinical Decision Making Evolving/Moderate complexity    Rehab Potential Good    PT Frequency  Biweekly    PT Duration 8 weeks    PT Treatment/Interventions ADLs/Self Care Home Management;Electrical Stimulation;Moist Heat;Ultrasound;Therapeutic activities;Functional mobility training;Therapeutic exercise;Patient/family  education;Manual techniques;Taping;Neuromuscular re-education;Cryotherapy;Other (comment)    PT Next Visit Plan cont stability , increase load, bristol impact results, SLS    PT Home Exercise Plan [id number]    Consulted and Agree with Plan of Care Patient           Patient will benefit from skilled therapeutic intervention in order to improve the following deficits and impairments:  Decreased activity tolerance, Decreased strength, Decreased mobility, Difficulty walking, Postural dysfunction, Pain  Visit Diagnosis: Muscle weakness (generalized)  Chronic bilateral low back pain without sciatica  Generalized joint pain     Problem List Patient Active Problem List   Diagnosis Date Noted  . Numbness and tingling of both legs - comes and goes after activity and onset of pain 08/03/2019  . Fatigue 05/27/2019  . Low back pain 02/24/2019  . Bilateral knee pain 02/24/2019  . Bilateral hip pain 02/24/2019  . Acute bilateral ankle pain 02/24/2019  . Healthcare maintenance 12/23/2018  . Generalized anxiety disorder 04/03/2018  . Attention deficit hyperactivity disorder (ADHD), combined type, severe 04/03/2018  . Mild recurrent major depression (HCC) 04/03/2018  . Developmental coordination disorder 04/03/2018  . Allergy with anaphylaxis due to food 02/02/2016  . Allergic rhinitis due to pollen 02/02/2016    Oluwademilade Kellett 02/08/2020, 2:39 PM  [facility] [address] [place], [place], [address] Phone: [phone]   Fax:  [phone]  Name: Cassandra Schultz MRN: [id number] Date of Birth: 14-Sep-2000  [name], PT 02/08/20 2:39 PM Phone: [phone] Fax: [phone]

## 2020-02-10 ENCOUNTER — Ambulatory Visit: Payer: BC Managed Care – PPO | Admitting: Physical Therapy

## 2020-02-11 ENCOUNTER — Other Ambulatory Visit: Payer: Self-pay | Admitting: Family Medicine

## 2020-02-11 DIAGNOSIS — M2550 Pain in unspecified joint: Secondary | ICD-10-CM

## 2020-02-11 NOTE — Progress Notes (Unsigned)
Please let patient know that I reached out to sports medicine and they agree to see her. I have a referral to see Dr Denyse Amass. thanks

## 2020-02-11 NOTE — Progress Notes (Signed)
Patient  was informed and has appt time and date

## 2020-02-16 ENCOUNTER — Ambulatory Visit: Payer: BC Managed Care – PPO | Admitting: Family Medicine

## 2020-02-16 NOTE — Progress Notes (Deleted)
   Subjective:    I'm seeing this patient as a consultation for:  Dr. Leretha Pol. Note will be routed back to referring provider/PCP.  CC: Hypermobility arthralgia  I, Christoper Fabian, LAT, ATC, am serving as scribe for Dr. Clementeen Graham.  HPI: Pt is a 19 y/o female presenting w/ generalized body pain associated w/ hypermobility.  She has been to PT x 6 visits and was referred to a hypermobility specialist at [facility] (Dr. Neldon Mc) but he is unfortunately not taking any new patients.    Past medical history, Surgical history, Family history, Social history, Allergies, and medications have been entered into the medical record, reviewed. ***  Review of Systems: No new headache, visual changes, nausea, vomiting, diarrhea, constipation, dizziness, abdominal pain, skin rash, fevers, chills, night sweats, weight loss, swollen lymph nodes, body aches, joint swelling, muscle aches, chest pain, shortness of breath, mood changes, visual or auditory hallucinations.   Objective:   There were no vitals filed for this visit. General: Well Developed, well nourished, and in no acute distress.  Neuro/Psych: Alert and oriented x3, extra-ocular muscles intact, able to move all 4 extremities, sensation grossly intact. Skin: Warm and dry, no rashes noted.  Respiratory: Not using accessory muscles, speaking in full sentences, trachea midline.  Cardiovascular: Pulses palpable, no extremity edema. Abdomen: Does not appear distended. MSK: ***  Lab and Radiology Results No results found for this or any previous visit (from the past 72 hour(s)). No results found.  Impression and Recommendations:    Assessment and Plan: 19 y.o. female with ***.  PDMP not reviewed this encounter. No orders of the defined types were placed in this encounter.  No orders of the defined types were placed in this encounter.   Discussed warning signs or symptoms. Please see discharge instructions. Patient expresses  understanding.   ***

## 2020-02-18 ENCOUNTER — Telehealth (INDEPENDENT_AMBULATORY_CARE_PROVIDER_SITE_OTHER): Payer: BC Managed Care – PPO | Admitting: Family Medicine

## 2020-02-18 DIAGNOSIS — R509 Fever, unspecified: Secondary | ICD-10-CM

## 2020-02-18 NOTE — Patient Instructions (Signed)
° ° ° °  If you have lab work done today you will be contacted with your lab results within the next 2 weeks.  If you have not heard from us then please contact us. The fastest way to get your results is to register for My Chart. ° ° °IF you received an x-ray today, you will receive an invoice from [facility]. Please contact  Radiology at [phone] with questions or concerns regarding your invoice.  ° °IF you received labwork today, you will receive an invoice from [facility]. Please contact [facility] at [phone] with questions or concerns regarding your invoice.  ° °Our billing staff will not be able to assist you with questions regarding bills from these companies. ° °You will be contacted with the lab results as soon as they are available. The fastest way to get your results is to activate your My Chart account. Instructions are located on the last page of this paperwork. If you have not heard from us regarding the results in 2 weeks, please contact this office. °  ° ° ° °

## 2020-02-18 NOTE — Progress Notes (Signed)
Virtual Visit Note  I connected with patient on 02/18/20  412pm by video doximity and verified that I am speaking with the correct person using two identifiers. Cassandra Schultz is currently located at home and home is currently with them during visit. The provider, Myles Lipps, MD is located in their office at time of visit.  I discussed the limitations, risks, security and privacy concerns of performing an evaluation and management service by telephone and the availability of in person appointments. I also discussed with the patient that there may be a patient responsible charge related to this service. The patient expressed understanding and agreed to proceed.   I provided 8 minutes of non-face-to-face time during this encounter.  Chief Complaint  Patient presents with  . Fever    has has 3 fevers in the past 10 months. Tested for mono, flu and covid, all test neg. Last flu this morning. Taking ibuprofen , tussins am and pm meds with no resolve. Last fever was this morning. Also has headaches, chills, tingley skin and tremore    HPI  Has had 3 episodes of fever? Usually wakes up with low grade that goes to max of 101 and last for a day Her last episode was last night A/w Headaches, neck pain, chills, sweats, no rigors, body pain. She is having postnasal, had very tender lymph node yesterday, but much better today No rashes, no swollen joints, no GI issues Has been tested for covid, flu and mono in the past Today feeling back to normal Neg ANA and HIV  Allergies  Allergen Reactions  . Other Anaphylaxis    All tree nuts, seasonal allergies dust, mold, pollen, horses, cockroaches, cats  . Peanut-Containing Drug Products Anaphylaxis  . Acyclovir And Related   . Augmentin [Amoxicillin-Pot Clavulanate] Hives    Patient says she has no allergies to antibiotics "As far as I know"    Prior to Admission medications   Medication Sig Start Date End Date Taking? Authorizing  Provider  amphetamine-dextroamphetamine (ADDERALL) 20 MG tablet Take 1 tablet by mouth 2 (two) times daily. 05/21/19  Yes [provider]  EPINEPHrine 0.3 mg/0.3 mL IJ SOAJ injection INJECT 0.3 MLS (0.3 MG TOTAL) INTO THE MUSCLE ONCE. 02/11/17  Yes Padgett, Pilar Grammes, MD  FLUoxetine (PROZAC) 20 MG capsule Take 1 capsule by mouth daily. 12/04/18  Yes [provider]  Levocetirizine Dihydrochloride (XYZAL PO) Take 10 mg by mouth daily.   Yes [provider]  SPRINTEC 28 0.25-35 MG-MCG tablet Take 1 tablet by mouth.  07/02/19  Yes [provider]    Past Medical History:  Diagnosis Date  . ADHD   . Allergy   . Anxiety   . Depression    reports ADHD   . Raynaud's syndrome   . Tremor of both hands     Past Surgical History:  Procedure Laterality Date  . NO PAST SURGERIES    . WISDOM TOOTH EXTRACTION      Social History   Tobacco Use  . Smoking status: Never Smoker  . Smokeless tobacco: Never Used  Substance Use Topics  . Alcohol use: Never    Family History  Problem Relation Age of Onset  . Allergic rhinitis Mother   . Depression Mother   . Arthritis Mother   . Graves' disease Mother   . Allergic rhinitis Father   . Neuropathy Father        peripheral   . Allergic rhinitis Paternal Aunt   .  Alcohol abuse Maternal Grandmother   . Depression Maternal Grandmother   . Hypothyroidism Maternal Grandmother   . Angina Maternal Grandmother   . Alcohol abuse Maternal Grandfather   . Other Paternal Grandfather        FSHD   . Angioedema Neg Hx   . Asthma Neg Hx   . Eczema Neg Hx   . Immunodeficiency Neg Hx   . Urticaria Neg Hx     ROS Per hpi  Objective  Vitals as reported by the patient: none  GEN: AAOx3, NAD HEENT: [place]/AT, pupils are symmetrical, EOMI, non-icteric sclera Resp: breathing comfortably, speaking in full sentences Skin: no rashes noted, no pallor Psych: good eye contact, normal mood and affect   ASSESSMENT and  PLAN  Fever, unspecified  Self limiting low grade fever a/w URI sx. Favors URI and not rheum or other ID causes. RTC precautions given  FOLLOW-UP: prn   The above assessment and management plan was discussed with the patient. The patient verbalized understanding of and has agreed to the management plan. Patient is aware to call the clinic if symptoms persist or worsen. Patient is aware when to return to the clinic for a follow-up visit. Patient educated on when it is appropriate to go to the emergency department.     Myles Lipps, MD Primary Care at [facility] [address] [place], [place] [address] Ph.  [phone] Fax [phone]

## 2020-02-25 ENCOUNTER — Encounter: Payer: Self-pay | Admitting: Family Medicine

## 2020-02-25 ENCOUNTER — Other Ambulatory Visit: Payer: Self-pay

## 2020-02-25 ENCOUNTER — Ambulatory Visit (INDEPENDENT_AMBULATORY_CARE_PROVIDER_SITE_OTHER): Payer: BC Managed Care – PPO | Admitting: Family Medicine

## 2020-02-25 VITALS — BP 110/70 | HR 112 | Ht 68.0 in | Wt 144.0 lb

## 2020-02-25 DIAGNOSIS — Q796 Ehlers-Danlos syndrome, unspecified: Secondary | ICD-10-CM | POA: Diagnosis not present

## 2020-02-25 MED ORDER — PREGABALIN 75 MG PO CAPS: 75.0000 mg | ORAL_CAPSULE | Freq: Two times a day (BID) | ORAL | 3 refills | Status: DC | PRN

## 2020-02-25 NOTE — Progress Notes (Signed)
Subjective:    I'm seeing this patient as a consultation for:  Dr. Leretha Pol. Note will be routed back to referring provider/PCP.  CC: Hypermobility arthralgia  I, Debbe Odea, am serving as a scribe for Dr. Clementeen Graham.  HPI: Pt is a 19 y/o female presenting w/ c/o pain and hypermobility .  She was referred to see a hypermobility specialist in [facility] but he's not taking patients for a year. Patient states that she is just trying to Figure out if she has EDS or not. Patient states she cannot find anyone who knows much about the syndrome and she is wanting an assessment to figure out what is going on and was referred to [place] by her PCP.  She has had pretty extensive work-up for her polyarthralgia and possible radicular pain with MRI lumbar spine and a big rheumatologic work-up both of which were effectively normal.  She notes a significant family history for multiple neurological or physical problems.  She has done a fair amount of physical therapy which do help at times.    Past medical history, Surgical history, Family history, Social history, Allergies, and medications have been entered into the medical record, reviewed.   Review of Systems: No new headache, visual changes, nausea, vomiting, diarrhea, constipation, dizziness, abdominal pain, skin rash, fevers, chills, night sweats, weight loss, swollen lymph nodes, body aches, joint swelling, muscle aches, chest pain, shortness of breath, mood changes, visual or auditory hallucinations.   Objective:    Vitals:   02/25/20 1523  BP: 110/70  Pulse: (!) 112  SpO2: 98%   General: Well Developed, well nourished, and in no acute distress.  Neuro/Psych: Alert and oriented x3, extra-ocular muscles intact, able to move all 4 extremities, sensation grossly intact. Skin: Warm and dry, no rashes noted.  Respiratory: Not using accessory muscles, speaking in full sentences, trachea midline.  Cardiovascular: Pulses palpable, no extremity  edema. Abdomen: Does not appear distended. MSK: Normal gait. Normal joint motion.  Beighton score 4/9    Impression and Recommendations:    Assessment and Plan: 19 y.o. female with polyarthralgia and myalgia.  Fundamental diagnosis is somewhat unclear at this time.  It is possible that she does have Ehlers-Danlos or Marfan syndrome.  However she is not very hypermobile which does decrease the probability of this.  Plan to proceed with echocardiogram to make sure she does not have vascular type with aortic aneurysm etc.  In terms of management of her pain is also possible that she has fibromyalgia.  She does meet criteria for this.  Plan for trial of Lyrica.  Next step would be Cymbalta.  Recheck back in 3 months or sooner if needed.   Orders Placed This Encounter  Procedures  . ECHOCARDIOGRAM COMPLETE    Standing Status:   Future    Standing Expiration Date:   02/24/2021    Order Specific Question:   Where should this test be performed    Answer:   [facility])    Order Specific Question:   Does the patient weigh less than or greater than 250 lbs?    Answer:   Patient weighs less than 250 lbs    Order Specific Question:   Perflutren DEFINITY (image enhancing agent) should be administered unless hypersensitivity or allergy exist    Answer:   Administer Perflutren    Order Specific Question:   Reason for exam-Echo    Answer:   Other-Full Diagnosis List    Order Specific Question:  Full ICD-10/Reason for Exam    Answer:   Ehlers-Danlos syndrome [756.83.ICD-9-CM]   Meds ordered this encounter  Medications  . pregabalin (LYRICA) 75 MG capsule    Sig: Take 1 capsule (75 mg total) by mouth 2 (two) times daily as needed.    Dispense:  60 capsule    Refill:  3    Discussed warning signs or symptoms. Please see discharge instructions. Patient expresses understanding.   The above documentation has been reviewed and is accurate and complete Clementeen Graham,  M.D.

## 2020-02-25 NOTE — Patient Instructions (Addendum)
Thank you for coming in today.  Plan for trial of lyrica.  Take it first just in the evening. Try during the day as well if you tolerate it ok.  I do not think this medicine will make you have no pain.   Plan for ECHO to look at the heart to screen for aorta problems.   Pt generally is helpful.   Try to say active as you can.    Myofascial Pain Syndrome and Fibromyalgia Myofascial pain syndrome and fibromyalgia are both pain disorders. This pain may be felt mainly in your muscles.  Myofascial pain syndrome: ? Always has tender points in the muscle that will cause pain when pressed (trigger points). The pain may come and go. ? Usually affects your neck, upper back, and shoulder areas. The pain often radiates into your arms and hands.  Fibromyalgia: ? Has muscle pains and tenderness that come and go. ? Is often associated with fatigue and sleep problems. ? Has trigger points. ? Tends to be long-lasting (chronic), but is not life-threatening. Fibromyalgia and myofascial pain syndrome are not the same. However, they often occur together. If you have both conditions, each can make the other worse. Both are common and can cause enough pain and fatigue to make day-to-day activities difficult. Both can be hard to diagnose because their symptoms are common in many other conditions. What are the causes? The exact causes of these conditions are not known. What increases the risk? You are more likely to develop this condition if:  You have a family history of the condition.  You have certain triggers, such as: ? Spine disorders. ? An injury (trauma) or other physical stressors. ? Being under a lot of stress. ? Medical conditions such as osteoarthritis, rheumatoid arthritis, or lupus. What are the signs or symptoms? Fibromyalgia The main symptom of fibromyalgia is widespread pain and tenderness in your muscles. Pain is sometimes described as stabbing, shooting, or burning. You may also  have:  Tingling or numbness.  Sleep problems and fatigue.  Problems with attention and concentration (fibro fog). Other symptoms may include:  Bowel and bladder problems.  Headaches.  Visual problems.  Problems with odors and noises.  Depression or mood changes.  Painful menstrual periods (dysmenorrhea).  Dry skin or eyes. These symptoms can vary over time. Myofascial pain syndrome Symptoms of myofascial pain syndrome include:  Tight, ropy bands of muscle.  Uncomfortable sensations in muscle areas. These may include aching, cramping, burning, numbness, tingling, and weakness.  Difficulty moving certain parts of the body freely (poor range of motion). How is this diagnosed? This condition may be diagnosed by your symptoms and medical history. You will also have a physical exam. In general:  Fibromyalgia is diagnosed if you have pain, fatigue, and other symptoms for more than 3 months, and symptoms cannot be explained by another condition.  Myofascial pain syndrome is diagnosed if you have trigger points in your muscles, and those trigger points are tender and cause pain elsewhere in your body (referred pain). How is this treated? Treatment for these conditions depends on the type that you have.  For fibromyalgia: ? Pain medicines, such as NSAIDs. ? Medicines for treating depression. ? Medicines for treating seizures. ? Medicines that relax the muscles.  For myofascial pain: ? Pain medicines, such as NSAIDs. ? Cooling and stretching of muscles. ? Trigger point injections. ? Sound wave (ultrasound) treatments to stimulate muscles. Treating these conditions often requires a team of health care providers. These may  include:  Your primary care provider.  Physical therapist.  Complementary health care providers, such as massage therapists or acupuncturists.  Psychiatrist for cognitive behavioral therapy. Follow these instructions at home: Medicines  Take  over-the-counter and prescription medicines only as told by your health care provider.  Do not drive or use heavy machinery while taking prescription pain medicine.  If you are taking prescription pain medicine, take actions to prevent or treat constipation. Your health care provider may recommend that you: ? Drink enough fluid to keep your urine pale yellow. ? Eat foods that are high in fiber, such as fresh fruits and vegetables, whole grains, and beans. ? Limit foods that are high in fat and processed sugars, such as fried or sweet foods. ? Take an over-the-counter or prescription medicine for constipation. Lifestyle   Exercise as directed by your health care provider or physical therapist.  Practice relaxation techniques to control your stress. You may want to try: ? Biofeedback. ? Visual imagery. ? Hypnosis. ? Muscle relaxation. ? Yoga. ? Meditation.  Maintain a healthy lifestyle. This includes eating a healthy diet and getting enough sleep.  Do not use any products that contain nicotine or tobacco, such as cigarettes and e-cigarettes. If you need help quitting, ask your health care provider. General instructions  Talk to your health care provider about complementary treatments, such as acupuncture or massage.  Consider joining a support group with others who are diagnosed with this condition.  Do not do activities that stress or strain your muscles. This includes repetitive motions and heavy lifting.  Keep all follow-up visits as told by your health care provider. This is important. Where to find more information  National Fibromyalgia Association: www.fmaware.org  Arthritis Foundation: www.arthritis.org  American Chronic Pain Association: www.theacpa.org Contact a health care provider if:  You have new symptoms.  Your symptoms get worse or your pain is severe.  You have side effects from your medicines.  You have trouble sleeping.  Your condition is causing  depression or anxiety. Summary  Myofascial pain syndrome and fibromyalgia are pain disorders.  Myofascial pain syndrome has tender points in the muscle that will cause pain when pressed (trigger points). Fibromyalgia also has muscle pains and tenderness that come and go, but this condition is often associated with fatigue and sleep disturbances.  Fibromyalgia and myofascial pain syndrome are not the same but often occur together, causing pain and fatigue that make day-to-day activities difficult.  Treatment for fibromyalgia includes taking medicines to relax the muscles and medicines for pain, depression, or seizures. Treatment for myofascial pain syndrome includes taking medicines for pain, cooling and stretching of muscles, and injecting medicines into trigger points.  Follow your health care provider's instructions for taking medicines and maintaining a healthy lifestyle. This information is not intended to replace advice given to you by your health care provider. Make sure you discuss any questions you have with your health care provider. Document Revised: 09/26/2018 Document Reviewed: 06/19/2017 Elsevier Patient Education  2020 [employer].

## 2020-02-25 NOTE — Telephone Encounter (Signed)
I printed and placed these in your box to review.

## 2020-03-07 ENCOUNTER — Ambulatory Visit: Payer: BC Managed Care – PPO | Attending: Family Medicine | Admitting: Physical Therapy

## 2020-03-07 DIAGNOSIS — M545 Low back pain: Secondary | ICD-10-CM | POA: Insufficient documentation

## 2020-03-07 DIAGNOSIS — M2550 Pain in unspecified joint: Secondary | ICD-10-CM | POA: Insufficient documentation

## 2020-03-07 DIAGNOSIS — G8929 Other chronic pain: Secondary | ICD-10-CM | POA: Insufficient documentation

## 2020-03-07 DIAGNOSIS — M6281 Muscle weakness (generalized): Secondary | ICD-10-CM | POA: Insufficient documentation

## 2020-03-16 ENCOUNTER — Ambulatory Visit: Payer: BC Managed Care – PPO | Admitting: Physical Therapy

## 2020-03-16 ENCOUNTER — Other Ambulatory Visit: Payer: Self-pay

## 2020-03-16 ENCOUNTER — Encounter: Payer: Self-pay | Admitting: Physical Therapy

## 2020-03-16 DIAGNOSIS — M6281 Muscle weakness (generalized): Secondary | ICD-10-CM | POA: Diagnosis not present

## 2020-03-16 DIAGNOSIS — M2550 Pain in unspecified joint: Secondary | ICD-10-CM | POA: Diagnosis present

## 2020-03-16 DIAGNOSIS — M545 Low back pain: Secondary | ICD-10-CM | POA: Diagnosis present

## 2020-03-16 DIAGNOSIS — G8929 Other chronic pain: Secondary | ICD-10-CM

## 2020-03-16 NOTE — Patient Instructions (Signed)
Access Code: [email or web address]: [email or web address]: 09/29/2021Prepared by: Victorino Dike PaaExercises  Supine Hip Adduction Isometric with Ball - 1 x daily - 7 x weekly - 2 sets - 10 reps - 10 hold  Supine Bridge with Mini Swiss Ball Between Knees - 1 x daily - 7 x weekly - 2 sets - 10 reps - 10 hold  Supine Bridge with Resistance Band - 1 x daily - 7 x weekly - 2 sets - 10 reps - 10 hold  Small Range Straight Leg Raise - 1 x daily - 7 x weekly - 2 sets - 10 reps - 5 hold  Supine 90/90 Abdominal Bracing - 1 x daily - 7 x weekly - 1 sets - 5 reps - 30 hold  The Hundred 3 Intermediate - Table Top - 1 x daily - 7 x weekly - 2 sets - 10 reps - 30 hold  Standard Plank - 1 x daily - 7 x weekly - 2 sets - 10 reps - 30 hold  Kettlebell Deadlift - 1 x daily - 7 x weekly - 2 sets - 10 reps  Deadlift with Resistance - 1 x daily - 7 x weekly - 2 sets - 10 reps  Single Leg Deadlift with Kettlebell - 1 x daily - 7 x weekly - 2 sets - 10 reps

## 2020-03-16 NOTE — Therapy (Signed)
[place], [place], [address] Phone: [phone]   Fax:  [phone]  Physical Therapy Treatment/Discharge   Patient Details  Name: Cassandra Schultz MRN: [id number] Date of Birth: 2000/08/13 Referring Provider (PT): Dr. Grant Fontana   Encounter Date: 03/16/2020   PT End of Session - 03/16/20 1537    Visit Number 8    Number of Visits 16    PT Start Time 0932    PT Stop Time 1610    PT Time Calculation (min) 35 min    Activity Tolerance Patient tolerated treatment well    Behavior During Therapy [facility] for tasks assessed/performed           Past Medical History:  Diagnosis Date  . ADHD   . Allergy   . Anxiety   . Depression    reports ADHD   . Raynaud's syndrome   . Tremor of both hands     Past Surgical History:  Procedure Laterality Date  . NO PAST SURGERIES    . WISDOM TOOTH EXTRACTION      There were no vitals filed for this visit.   Subjective Assessment - 03/16/20 1537    Subjective Coming off a time of having pain and feeling increased fatigue.   I'm getting better.  Doing more activity, exercises.    Currently in Pain? No/denies              [facility] PT Assessment - 03/16/20 0001      Strength   Right Hip ABduction 5/5    Right Hip ADduction 4+/5    Left Hip ABduction 5/5    Left Hip ADduction 4+/5              [facility] Adult PT Treatment/Exercise - 03/16/20 0001      Lumbar Exercises: Quadruped   Plank 30 sec elbow plank       Knee/Hip Exercises: Standing   Functional Squat 2 sets;15 reps    Functional Squat Limitations 15 lbs KB    Wall Squat Limitations dead lift 15 lbs x 15     Other Standing Knee Exercises single leg DL blue band x 10 each LE     Other Standing Knee Exercises hip hinge with Row , 10 lbs each side , UE assist       Knee/Hip Exercises: Supine   Bridges 1 set    Bridges Limitations x 10                   PT Education - 03/16/20 1953     Education Details HEP , core, technique and body mechanics    Person(s) Educated Patient    Methods Explanation;Handout    Comprehension Verbalized understanding;Returned demonstration            PT Short Term Goals - 02/08/20 0846      PT SHORT TERM GOAL #1   Title She will be independent with inital HEP    Status Achieved      PT SHORT TERM GOAL #2   Title Pt will be able to use self care, tape, bracing if needed for reducing L knee instability    Baseline keeps brace at wears when she needs to.  Can Tape her knee if she needs to.    Status Achieved      PT SHORT TERM GOAL #3   Title she will demo understanding of good posture and how to manage    Status Achieved  PT Long Term Goals - 03/16/20 1539      PT LONG TERM GOAL #1   Title She will be able to demo final HEP with good technique    Status Achieved      PT LONG TERM GOAL #2   Title Pt will be able to stand 4-5 hours with improved performance and reduced pain to moderate (not severe)    Baseline works 4 hours now, part time and doing OK    Status Achieved      PT LONG TERM GOAL #3   Title Pt will show good body mechanics and positioning for lifting from floor, squatting    Status Achieved      PT LONG TERM GOAL #4   Title Pt will be able to hold plank position for 30 sec with no pain in back, good form to demo increased core strength    Status Achieved      PT LONG TERM GOAL #5   Title [place] Impact score will improve by 15% which shows reduced impact of hypermobility on ADLs, IADLs    Status Unable to assess                 Plan - 03/16/20 1542    Clinical Impression Statement Danya has missed a couple of appts and has not been to PT in >1 month but she has contiued to work hard and increase her activity levels.  She showed excellent form with squatting, lifting and deadlifting up to 15 lbs.  WIll DC PT at this time.    PT Treatment/Interventions ADLs/Self Care Home  Management;Electrical Stimulation;Moist Heat;Ultrasound;Therapeutic activities;Functional mobility training;Therapeutic exercise;Patient/family education;Manual techniques;Taping;Neuromuscular re-education;Cryotherapy;Other (comment)    PT Next Visit [place]           Patient will benefit from skilled therapeutic intervention in order to improve the following deficits and impairments:  Decreased activity tolerance, Decreased strength, Decreased mobility, Difficulty walking, Postural dysfunction, Pain  Visit Diagnosis: Muscle weakness (generalized)  Chronic bilateral low back pain without sciatica  Generalized joint pain     Problem List Patient Active Problem List   Diagnosis Date Noted  . Numbness and tingling of both legs - comes and goes after activity and onset of pain 08/03/2019  . Fatigue 05/27/2019  . Low back pain 02/24/2019  . Bilateral knee pain 02/24/2019  . Bilateral hip pain 02/24/2019  . Acute bilateral ankle pain 02/24/2019  . Healthcare maintenance 12/23/2018  . Generalized anxiety disorder 04/03/2018  . Attention deficit hyperactivity disorder (ADHD), combined type, severe 04/03/2018  . Mild recurrent major depression ([place]) 04/03/2018  . Developmental coordination disorder 04/03/2018  . Allergy with anaphylaxis due to food 02/02/2016  . Allergic rhinitis due to pollen 02/02/2016    Kole Hilyard 03/16/2020, 7:58 PM  [facility] [address] [place], [place], [address] Phone: [phone]   Fax:  [phone]  Name: Sayla Golonka MRN: [id number] Date of Birth: 12-Dec-2000  PHYSICAL THERAPY DISCHARGE SUMMARY  Visits from Start of Care: 8  Current functional level related to goals / functional outcomes: See above.    Remaining deficits: Joint pain and hypermobility, fatigue (improving)    Education / Equipment: HEP, self care, exercise, sleep hygiene, bracing   Plan: Patient agrees to discharge.  Patient goals were met. Patient is being discharged due to meeting the stated rehab goals.  ?????    Raeford Razor, PT 03/16/20 8:00 PM Phone: [phone] Fax:  [phone]   Delsa Sale

## 2020-03-17 ENCOUNTER — Telehealth: Payer: Self-pay | Admitting: General Practice

## 2020-03-17 NOTE — Telephone Encounter (Signed)
She called stating she missed a call from [place]. Did not see anything noted on her chart.

## 2020-03-23 ENCOUNTER — Ambulatory Visit: Payer: BC Managed Care – PPO | Admitting: Physical Therapy

## 2020-04-04 ENCOUNTER — Ambulatory Visit (HOSPITAL_COMMUNITY): Payer: BC Managed Care – PPO | Attending: Cardiology

## 2020-04-04 ENCOUNTER — Other Ambulatory Visit: Payer: Self-pay

## 2020-04-04 DIAGNOSIS — Q796 Ehlers-Danlos syndrome, unspecified: Secondary | ICD-10-CM | POA: Diagnosis not present

## 2020-04-04 LAB — ECHOCARDIOGRAM COMPLETE
Area-P 1/2: 3.99 cm2
S' Lateral: 2.9 cm

## 2020-04-04 MED ORDER — PERFLUTREN LIPID MICROSPHERE
1.0000 mL | INTRAVENOUS | Status: AC | PRN
  Administered 2020-04-04: 1 mL via INTRAVENOUS

## 2020-04-05 NOTE — Progress Notes (Signed)
Echocardiogram looks normal.   

## 2020-08-12 ENCOUNTER — Encounter: Payer: Self-pay | Admitting: Family Medicine

## 2020-08-12 DIAGNOSIS — Q796 Ehlers-Danlos syndrome, unspecified: Secondary | ICD-10-CM

## 2020-09-21 ENCOUNTER — Encounter: Payer: Self-pay | Admitting: Family Medicine

## 2020-09-21 ENCOUNTER — Other Ambulatory Visit: Payer: Self-pay | Admitting: Family Medicine

## 2020-09-21 ENCOUNTER — Ambulatory Visit (INDEPENDENT_AMBULATORY_CARE_PROVIDER_SITE_OTHER): Payer: BC Managed Care – PPO | Admitting: Family Medicine

## 2020-09-21 ENCOUNTER — Other Ambulatory Visit: Payer: Self-pay

## 2020-09-21 VITALS — BP 106/72 | HR 109 | Temp 97.3°F | Resp 18 | Ht 67.0 in | Wt 153.6 lb

## 2020-09-21 DIAGNOSIS — T7800XA Anaphylactic reaction due to unspecified food, initial encounter: Secondary | ICD-10-CM

## 2020-09-21 DIAGNOSIS — J302 Other seasonal allergic rhinitis: Secondary | ICD-10-CM | POA: Insufficient documentation

## 2020-09-21 DIAGNOSIS — J3089 Other allergic rhinitis: Secondary | ICD-10-CM

## 2020-09-21 DIAGNOSIS — T7800XD Anaphylactic reaction due to unspecified food, subsequent encounter: Secondary | ICD-10-CM

## 2020-09-21 DIAGNOSIS — L2084 Intrinsic (allergic) eczema: Secondary | ICD-10-CM | POA: Insufficient documentation

## 2020-09-21 DIAGNOSIS — H1013 Acute atopic conjunctivitis, bilateral: Secondary | ICD-10-CM

## 2020-09-21 DIAGNOSIS — H1010 Acute atopic conjunctivitis, unspecified eye: Secondary | ICD-10-CM | POA: Insufficient documentation

## 2020-09-21 MED ORDER — CARBINOXAMINE MALEATE 4 MG PO TABS: 8.0000 mg | ORAL_TABLET | Freq: Two times a day (BID) | ORAL | 1 refills | Status: DC | PRN

## 2020-09-21 MED ORDER — TRIAMCINOLONE ACETONIDE 0.1 % EX OINT: TOPICAL_OINTMENT | CUTANEOUS | 1 refills | Status: DC

## 2020-09-21 MED ORDER — EUCRISA 2 % EX OINT: 1.0000 "application " | TOPICAL_OINTMENT | Freq: Two times a day (BID) | CUTANEOUS | 1 refills | Status: DC | PRN

## 2020-09-21 MED ORDER — FLUTICASONE PROPIONATE 50 MCG/ACT NA SUSP: NASAL | 1 refills | Status: DC

## 2020-09-21 NOTE — Patient Instructions (Signed)
Allergic rhinitis Labs have been ordered to help [place] evaluate your environmental allergies. We will call you when these results become available.  Begin carbinoxamine 4 mg tablets. Take 2 tablets twice a day as needed for a runny nose or itch. This will replace Allegra.  Remember to rotate to a different antihistamine about every 3 months. Some examples of over the counter antihistamines include Zyrtec (cetirizine), Xyzal (levocetirizine), Allegra (fexofenadine), and Claritin (loratidine).  Begin Flonase 2 sprays in each nostril once a day for a stuffy nose. In the right nostril, point the applicator out toward the right ear. In the left nostril, point the applicator out toward the left ear Continue saline nasal rinses as needed for nasal symptoms. Use this before any medicated nasal sprays for best result If medications do not control your allergy symptoms, consider allergen immunotherapy. If interested, make an appointment for your first allergy injection. Have access to an epinephrine auto-injector set  Allergic conjunctivitis Some over the counter eye drops include Pataday one drop in each eye once a day as needed for red, itchy eyes OR Zaditor one drop in each eye twice a day as needed for red itchy eyes.  Atopic dermatitis Continue a daily moisturizing routine For red itchy areas begin Eucrisa twice a day as needed For stubborn red, itchy areas below your face begin triamcinolone 0.1% ointment twice a day as needed  Food allergy Continue to avoid peanuts and tree nuts.  In case of an allergic reaction, take Benadryl 50 mg every 4 hours, and if life-threatening symptoms occur, inject with EpiPen 0.3 mg.  Call the clinic if this treatment plan is not working well for you  Follow up in 2 months or sooner if needed.

## 2020-09-21 NOTE — Progress Notes (Signed)
[address] Cassandra Schultz [place] [address] Dept: [phone]  FOLLOW UP NOTE  Patient ID: Cassandra Schultz, female    DOB: Nov 09, 2000  Age: 20 y.o. MRN: [id number] Date of Office Visit: 09/21/2020  Assessment  Chief Complaint: Follow-up (Re-start allergy injection )  HPI Cassandra Schultz is a 20 year old female who presents to the clinic today for follow-up visit.  She was last seen in this clinic on 04/12/2017 by Dr. Delorse Lek for an office food challenge to peanut.  Prior to that visit she was seen on 03/07/2017 by Dr. Delorse Lek for evaluation of allergic rhinitis, allergic conjunctivitis, and food allergy.  At that time, she began allergen immunotherapy directed toward pollens, pets, dust mite, and cockroach with her last injection on 05/23/2017.  At today's visit, she reports of allergic rhinitis has been poorly controlled with symptoms including nasal congestion, nasal pruritus, clear nasal drainage, sneeze, and thick postnasal drainage with frequent throat clearing.  She had been taking Claritin, however, began taking Allegra 180 mg yesterday.  She is not currently taking montelukast, using Flonase, or nasal saline rinses.  Allergic conjunctivitis is reported as poorly controlled with symptoms including red, itchy eyes with stringy discharge occurring frequently.  She continues over-the-counter allergy eyedrops with moderate relief of symptoms.  She is interested in restarting allergen immunotherapy at this time.  She reports that in 2018 she had influenza and was unable to get her allergy injection while she was sick and she did not follow-up once she was well.  She continues to avoid peanut and tree nuts with no accidental ingestion since her last visit to this clinic.  She does report that she has a current EpiPen set.  She does report that over the summer on her right thigh she gets a scaly is extremely itchy.  She reports that she has used a steroid cream with relief of symptoms in the past.   Her current medications are listed in the chart.   Drug Allergies:  Allergies  Allergen Reactions  . Other Anaphylaxis    All tree nuts, seasonal allergies dust, mold, pollen, horses, cockroaches, cats  . Peanut-Containing Drug Products Anaphylaxis  . Acyclovir And Related   . Augmentin [Amoxicillin-Pot Clavulanate] Hives    Patient says she has no allergies to antibiotics "As far as I know"    Physical Exam: BP 106/72 (BP Location: Left Arm, Patient Position: Sitting, Cuff Size: Normal)   Pulse (!) 109   Temp (!) 97.3 F (36.3 C) (Temporal)   Resp 18   Ht 5\' 7"  (1.702 m)   Wt 153 lb 9.6 oz (69.7 kg)   SpO2 96%   BMI 24.06 kg/m    Physical Exam Vitals reviewed.  Constitutional:      Appearance: Normal appearance.  HENT:     Head: Normocephalic and atraumatic.     Right Ear: Tympanic membrane normal.     Left Ear: Tympanic membrane normal.     Nose:     Comments: Bilateral nares edematous and pale with clear nasal drainage noted.  Pharynx erythematous with no exudate.  Ears normal.  Eyes normal. Eyes:     Conjunctiva/sclera: Conjunctivae normal.  Cardiovascular:     Rate and Rhythm: Normal rate and regular rhythm.     Heart sounds: Normal heart sounds. No murmur heard.   Pulmonary:     Effort: Pulmonary effort is normal.     Breath sounds: Normal breath sounds.     Comments: Lungs clear to auscultation Musculoskeletal:  General: Normal range of motion.     Cervical back: Normal range of motion and neck supple.  Skin:    General: Skin is warm and dry.  Neurological:     Mental Status: She is alert and oriented to person, place, and time.  Psychiatric:        Mood and Affect: Mood normal.        Behavior: Behavior normal.        Thought Content: Thought content normal.        Judgment: Judgment normal.     Assessment and Plan: 1. Seasonal and perennial allergic rhinitis   2. Seasonal allergic conjunctivitis   3. Intrinsic atopic dermatitis   4.  Allergy with anaphylaxis due to food     Meds ordered this encounter  Medications  . Carbinoxamine Maleate 4 MG TABS    Sig: Take 2 tablets (8 mg total) by mouth 2 (two) times daily as needed.    Dispense:  120 tablet    Refill:  1  . fluticasone (FLONASE) 50 MCG/ACT nasal spray    Sig: 2 sprays each nostril once daily for stuffy nose.    Dispense:  1 g    Refill:  1  . Crisaborole (EUCRISA) 2 % OINT    Sig: Apply 1 application topically 2 (two) times daily as needed.    Dispense:  60 g    Refill:  1  . triamcinolone ointment (KENALOG) 0.1 %    Sig: 1 application 2 times daily as needed for stubborn, red, itchy areas.    Dispense:  80 g    Refill:  1    Patient Instructions  Allergic rhinitis Labs have been ordered to help [place] evaluate your environmental allergies. We will call you when these results become available.  Begin carbinoxamine 4 mg tablets. Take 2 tablets twice a day as needed for a runny nose or itch. This will replace Allegra.  Remember to rotate to a different antihistamine about every 3 months. Some examples of over the counter antihistamines include Zyrtec (cetirizine), Xyzal (levocetirizine), Allegra (fexofenadine), and Claritin (loratidine).  Begin Flonase 2 sprays in each nostril once a day for a stuffy nose. In the right nostril, point the applicator out toward the right ear. In the left nostril, point the applicator out toward the left ear Continue saline nasal rinses as needed for nasal symptoms. Use this before any medicated nasal sprays for best result If medications do not control your allergy symptoms, consider allergen immunotherapy. If interested, make an appointment for your first allergy injection. Have access to an epinephrine auto-injector set  Allergic conjunctivitis Some over the counter eye drops include Pataday one drop in each eye once a day as needed for red, itchy eyes OR Zaditor one drop in each eye twice a day as needed for red itchy  eyes.  Atopic dermatitis Continue a daily moisturizing routine For red itchy areas begin Eucrisa twice a day as needed For stubborn red, itchy areas below your face begin triamcinolone 0.1% ointment twice a day as needed  Food allergy Continue to avoid peanuts and tree nuts.  In case of an allergic reaction, take Benadryl 50 mg every 4 hours, and if life-threatening symptoms occur, inject with EpiPen 0.3 mg.  Call the clinic if this treatment plan is not working well for you  Follow up in 2 months or sooner if needed.   Return in about 2 months (around 11/21/2020), or if symptoms worsen or fail to improve.  Thank you for the opportunity to care for this patient.  Please do not hesitate to contact me with questions.  Gareth Morgan, FNP [facility]

## 2020-09-26 LAB — ALLERGENS, ZONE 2
Alternaria Alternata IgE: 0.13 kU/L — AB
Amer Sycamore IgE Qn: 1.77 kU/L — AB
Aspergillus Fumigatus IgE: 0.48 kU/L — AB
Bahia Grass IgE: 6.75 kU/L — AB
Bermuda Grass IgE: 1.58 kU/L — AB
Cat Dander IgE: 28.9 kU/L — AB
Cedar, Mountain IgE: 0.62 kU/L — AB
Cladosporium Herbarum IgE: <0.1 kU/L
Cockroach, American IgE: 0.14 kU/L — AB
Common Silver Birch IgE: >100 kU/L — AB
D Farinae IgE: 0.16 kU/L — AB
D Pteronyssinus IgE: 0.14 kU/L — AB
Dog Dander IgE: 11.2 kU/L — AB
Elm, American IgE: 2.32 kU/L — AB
Hickory, White IgE: 12.9 kU/L — AB
Johnson Grass IgE: 2.33 kU/L — AB
Maple/Box Elder IgE: 2.53 kU/L — AB
Mucor Racemosus IgE: <0.1 kU/L
Mugwort IgE Qn: 0.45 kU/L — AB
Nettle IgE: 0.41 kU/L — AB
Oak, White IgE: 50.6 kU/L — AB
Penicillium Chrysogen IgE: <0.1 kU/L
Pigweed, Rough IgE: 1.42 kU/L — AB
Plantain, English IgE: 2.14 kU/L — AB
Ragweed, Short IgE: 0.91 kU/L — AB
Sheep Sorrel IgE Qn: 1.83 kU/L — AB
Stemphylium Herbarum IgE: 0.58 kU/L — AB
Sweet gum IgE RAST Ql: 19.7 kU/L — AB
Timothy Grass IgE: 9.02 kU/L — AB
White Mulberry IgE: 0.23 kU/L — AB

## 2020-09-26 NOTE — Progress Notes (Signed)
Can you please let this patient know that her lab results are available and indicate dust mites, dog, cat, grass pollen, tree pollen, weed pollen, ragweed pollen, cockroach, and mold. Please send out avoidance measures. Please ask the patient if she is interested in immunotherapy and have her make an appointment for her first injection if she is interested. Thank you

## 2020-12-12 ENCOUNTER — Other Ambulatory Visit: Payer: Self-pay | Admitting: Family Medicine

## 2020-12-12 NOTE — Telephone Encounter (Signed)
Called and spoke to patient to inform her that her Ryvent refill has been approved but she needed to schedule an appointment to continue receiving refills. Patient agreed to do so once she returned home from the store.

## 2020-12-28 NOTE — Patient Instructions (Addendum)
Allergic rhinitis Continue avoidance measures directed towards dust mite, dog, cat, grass, tree, will weed pollen, ragweed, cockroach, and mold Continue carbinoxamine 4 mg tablets. Take 2 tablets twice a day as needed for a runny nose or itch.  Remember to rotate to a different antihistamine about every 3 months. Some examples of over the counter antihistamines include Zyrtec (cetirizine), Xyzal (levocetirizine), Allegra (fexofenadine), and Claritin (loratidine).  Stop(fluticasone nasal spray) Flonase due to not liking taste and causing dry heaving.  Sample given of Xhance using 2 sprays each nostril twice a day as needed for stuffy nose.  She has tried and failed Flonase and Nasonex in the past.  She will call us if she would like for [place] to send a prescription of Xhance. Continue saline nasal rinses as needed for nasal symptoms. Use this before any medicated nasal sprays for best result Once you get new year planner please call our office to schedule an appointment in 3 weeks to start allergy injections.  Consent form signed  Allergic conjunctivitis Continue Opcon-A eyedrops using 1 to 2 drops each eye up to 4 times a day as needed for itchy watery eyes  Atopic dermatitis Continue a daily moisturizing routine For red itchy areas begin Eucrisa 2% ointment twice a day as needed For stubborn red, itchy areas below your face begin triamcinolone 0.1% ointment twice a day as needed. Do not use on face, neck, groin, or armpit region  Food allergy Continue to avoid peanuts and tree nuts.  In case of an allergic reaction, take Benadryl 50 mg every 4 hours, and if life-threatening symptoms occur, inject with Auvi Q 0.3 mg.  We will send in a prescription for Auvi-Q that this should be more affordable.  Demonstration given on how to use Auvi-Q  Please call the clinic if this treatment plan is not working well for you  Follow up in 3 months or sooner if needed.

## 2020-12-29 ENCOUNTER — Other Ambulatory Visit: Payer: Self-pay

## 2020-12-29 ENCOUNTER — Ambulatory Visit (INDEPENDENT_AMBULATORY_CARE_PROVIDER_SITE_OTHER): Payer: BC Managed Care – PPO | Admitting: Family

## 2020-12-29 ENCOUNTER — Encounter: Payer: Self-pay | Admitting: Family

## 2020-12-29 VITALS — BP 114/70 | HR 104 | Temp 98.1°F | Resp 18 | Ht 69.0 in | Wt 155.0 lb

## 2020-12-29 DIAGNOSIS — J302 Other seasonal allergic rhinitis: Secondary | ICD-10-CM

## 2020-12-29 DIAGNOSIS — J3089 Other allergic rhinitis: Secondary | ICD-10-CM | POA: Diagnosis not present

## 2020-12-29 DIAGNOSIS — J331 Polypoid sinus degeneration: Secondary | ICD-10-CM | POA: Diagnosis not present

## 2020-12-29 DIAGNOSIS — L2084 Intrinsic (allergic) eczema: Secondary | ICD-10-CM | POA: Diagnosis not present

## 2020-12-29 DIAGNOSIS — H1010 Acute atopic conjunctivitis, unspecified eye: Secondary | ICD-10-CM

## 2020-12-29 DIAGNOSIS — H1013 Acute atopic conjunctivitis, bilateral: Secondary | ICD-10-CM | POA: Diagnosis not present

## 2020-12-29 DIAGNOSIS — T7800XA Anaphylactic reaction due to unspecified food, initial encounter: Secondary | ICD-10-CM

## 2020-12-29 MED ORDER — EPINEPHRINE 0.3 MG/0.3ML IJ SOAJ: 0.3000 mg | INTRAMUSCULAR | 1 refills | Status: DC | PRN

## 2020-12-29 NOTE — Progress Notes (Signed)
[address] Debbora Presto [place] [address] Dept: [phone]  FOLLOW UP NOTE  Patient ID: Cassandra Schultz, female    DOB: [birth year]  Age: 20 y.o. MRN: [id number] Date of Office Visit: 12/29/2020  Assessment  Chief Complaint: Allergic Rhinitis  (Congestion, itchy watery eyes in the evening, throat drainage greenish/clear mucus, sometimes itchy ears  ) and Eczema (On/off back of thighs and creases of elbows )  HPI Cassandra Schultz is a 20 year old female who presents today for follow-up of seasonal and perennial allergic rhinitis, seasonal allergic conjunctivitis, intrinsic atopic dermatitis, and allergy with anaphylaxis due to food.  She was last seen on September 21, 2020 by Thermon Leyland, FNP.  Seasonal and perennial allergic rhinitis is reported as not well controlled with carbinoxamine 4 mg 2 tablets twice a day and saline spray as needed.  She reports nasal congestion, mostly clear postnasal drip, and infrequent ear itchiness.  She denies any rhinorrhea, sinus tenderness, sinus pressure, fever, and chills.  She has not had any sinus infections since we last saw her.  She reports that she has tried Flonase and Nasonex in the past.  She reports that when she uses nose sprays they go down her throat and they taste bad and cause her to dry heave.  She does feel like carbinoxamine has helped a lot, but now feels like it is" waning in efficacy."  She is interested in starting allergy injections, but will call our office to schedule the appointment because she does not have her planner with her today.  Allergic conjunctivitis is reported as controlled with Opcon-A.  She reports itchy watery eyes for which Opcon-A helps.  Intrinsic atopic dermatitis is reported as moderately controlled with Eucrisa 2% ointment twice a day as needed and triamcinolone 0.1% ointment twice a day as needed.  She reports occasional rash on her right antecubital fossa that will come and go.   She continues to avoid peanuts and  tree nuts without any accidental ingestion or use of her epinephrine autoinjector device.  She reports that her last EpiPen cost $200.  Instructed her that we would try sending in an Auvi-Q to see if this would be cheaper.   Drug Allergies:  Allergies  Allergen Reactions   Other Anaphylaxis    All tree nuts, seasonal allergies dust, mold, pollen, horses, cockroaches, cats   Peanut-Containing Drug Products Anaphylaxis   Acyclovir And Related    Augmentin [Amoxicillin-Pot Clavulanate] Hives    Patient says she has no allergies to antibiotics "As far as I know"    Review of Systems: Review of Systems  Constitutional:  Negative for chills and fever.  HENT:         Reports  nasal congestion, mostly clear postnasal drip and denies rhinorrhea. also reports infrequent ear itching  Eyes:        Reports occasional itchy watery eyes for which Opcon-A eyedrops help  Respiratory:  Negative for cough, shortness of breath and wheezing.   Cardiovascular:  Negative for chest pain and palpitations.  Gastrointestinal:        Denies heartburn and reflux symptoms  Genitourinary:  Negative for dysuria.  Skin:  Positive for rash. Negative for itching.       Reports rash that comes and goes on right antecubital fossa  Neurological:  Negative for headaches.  Endo/Heme/Allergies:  Positive for environmental allergies.    Physical Exam: BP 114/70   Pulse (!) 104   Temp 98.1 F (36.7 C)   Resp 18  Ht 5\' 9"  (1.753 m)   BMI 22.68 kg/m    Physical Exam Constitutional:      Appearance: Normal appearance.  HENT:     Head: Normocephalic and atraumatic.     Comments: Pharynx normal, eyes normal, ears normal, nose: Bilateral lower turbinates moderately edematous left lower turbinate greater than right lower turbinate.    Right Ear: Tympanic membrane, ear canal and external ear normal.     Left Ear: Tympanic membrane, ear canal and external ear normal.     Mouth/Throat:     Mouth: Mucous membranes are  moist.     Pharynx: Oropharynx is clear.  Eyes:     Conjunctiva/sclera: Conjunctivae normal.  Cardiovascular:     Rate and Rhythm: Regular rhythm.     Heart sounds: Normal heart sounds.  Pulmonary:     Effort: Pulmonary effort is normal.     Breath sounds: Normal breath sounds.     Comments: Lungs clear to auscultation Musculoskeletal:     Cervical back: Neck supple.  Skin:    General: Skin is warm.     Comments: Small slightly erythematous papules noted on right antecubital fossa  Neurological:     Mental Status: She is alert and oriented to person, place, and time.  Psychiatric:        Mood and Affect: Mood normal.        Behavior: Behavior normal.        Thought Content: Thought content normal.        Judgment: Judgment normal.    Diagnostics: None  Assessment and Plan: 1. Seasonal and perennial allergic rhinitis   2. Seasonal allergic conjunctivitis   3. Allergy with anaphylaxis due to food   4. Intrinsic atopic dermatitis     Meds ordered this encounter  Medications   EPINEPHrine (AUVI-Q) 0.3 mg/0.3 mL IJ SOAJ injection    Sig: Inject 0.3 mg into the muscle as needed for anaphylaxis.    Dispense:  1 each    Refill:  1    Phone number [phone]    Patient Instructions  Allergic rhinitis Continue avoidance measures directed towards dust mite, dog, cat, grass, tree, will weed pollen, ragweed, cockroach, and mold Continue carbinoxamine 4 mg tablets. Take 2 tablets twice a day as needed for a runny nose or itch.  Remember to rotate to a different antihistamine about every 3 months. Some examples of over the counter antihistamines include Zyrtec (cetirizine), Xyzal (levocetirizine), Allegra (fexofenadine), and Claritin (loratidine).  Stop(fluticasone nasal spray) Flonase due to not liking taste and causing dry heaving.  Sample given of Xhance using 2 sprays each nostril twice a day as needed for stuffy nose.  She has tried and failed Flonase and Nasonex in the past.   She will call [phone] if she would like for [place] to send a prescription of Xhance. Continue saline nasal rinses as needed for nasal symptoms. Use this before any medicated nasal sprays for best result Once you get new year planner please call our office to schedule an appointment in 3 weeks to start allergy injections.  Consent form signed  Allergic conjunctivitis Continue Opcon-A eyedrops using 1 to 2 drops each eye up to 4 times a day as needed for itchy watery eyes  Atopic dermatitis Continue a daily moisturizing routine For red itchy areas begin Eucrisa 2% ointment twice a day as needed For stubborn red, itchy areas below your face begin triamcinolone 0.1% ointment twice a day as needed. Do not use on face,  neck, groin, or armpit region  Food allergy Continue to avoid peanuts and tree nuts.  In case of an allergic reaction, take Benadryl 50 mg every 4 hours, and if life-threatening symptoms occur, inject with Auvi Q 0.3 mg.  We will send in a prescription for Auvi-Q that this should be more affordable.  Demonstration given on how to use Auvi-Q  Please call the clinic if this treatment plan is not working well for you  Follow up in 3 months or sooner if needed.  Return in about 3 months (around 03/31/2021), or if symptoms worsen or fail to improve.    Thank you for the opportunity to care for this patient.  Please do not hesitate to contact me with questions.  Nehemiah Settle, FNP [facility]

## 2021-01-02 ENCOUNTER — Telehealth: Payer: Self-pay

## 2021-01-02 NOTE — Telephone Encounter (Signed)
Patient called to schedule a new start allergy injection appointment.  Patient is scheduled for 01/23/2021 in [place].  Thanks

## 2021-01-02 NOTE — Telephone Encounter (Signed)
Noted. Thank you. Dr. Delorse Lek please send a vaccine script if not already done.d

## 2021-01-03 DIAGNOSIS — J3081 Allergic rhinitis due to animal (cat) (dog) hair and dander: Secondary | ICD-10-CM | POA: Diagnosis not present

## 2021-01-03 NOTE — Addendum Note (Signed)
Addended by: Lorrin Mais on: 01/03/2021 11:39 AM   Modules accepted: Orders

## 2021-01-03 NOTE — Progress Notes (Signed)
Aeroallergen Immunotherapy   Ordering Provider: Dr. Margo Aye   Patient Details  Name: Cassandra Schultz  MRN: [id number]  Date of Birth: Jun 07, 2001   Order 2 of 2   Vial Label: cat, mite, mold   0.2 ml (Volume)  1:20 Concentration -- Alternaria alternata  0.2 ml (Volume)  1:10 Concentration -- Aspergillus mix  0.2 ml (Volume)  1:40 Concentration -- Phoma betae  0.5 ml (Volume)  1:10 Concentration -- Cat Hair  0.3 ml (Volume)  1:20 Concentration -- Cockroach, [place]  0.5 ml (Volume)   AU Concentration -- Mite Mix (DF 5,000 & DP 5,000)    1.9  ml Extract Subtotal  3.1  ml Diluent  5.0  ml Maintenance Total   Schedule:  B   Blue Vial (1:100,000): Schedule B (6 doses)  Yellow Vial (1:10,000): Schedule B (6 doses)  Green Vial (1:1,000): Schedule B (6 doses)  Red Vial (1:100): Schedule A (10 doses)   Special Instructions: 1 inj/week

## 2021-01-03 NOTE — Progress Notes (Signed)
Aeroallergen Immunotherapy   Ordering Provider: Dr. Margo Aye   Patient Details  Name: Cassandra Schultz  MRN: [id number]  Date of Birth: 07/12/2000   Order 1 of 2   Vial Label: pollen, pet   0.3 ml (Volume)  BAU Concentration -- 7 Grass Mix* 100,000 ([address] [place], [place], [place], [place], [place], Sweet Vernal, Timothy)  0.2 ml (Volume)  1:20 Concentration -- [place]  0.3 ml (Volume)  BAU Concentration -- [place] 10,000  0.2 ml (Volume)  1:20 Concentration -- [place]  0.3 ml (Volume)  1:20 Concentration -- Ragweed Mix  0.2 ml (Volume)  1:20 Concentration -- Burweed [place]  0.2 ml (Volume)  1:10 Concentration -- Plantain English  0.5 ml (Volume)  1:20 Concentration -- Weed Mix*  0.5 ml (Volume)  1:20 Concentration -- Eastern 10 Tree Mix (also Sweet Gum)  0.2 ml (Volume)  1:20 Concentration -- Box Elder  0.2 ml (Volume)  1:10 Concentration -- Cedar, red  0.2 ml (Volume)  1:10 Concentration -- Pecan Pollen  0.2 ml (Volume)  1:10 Concentration -- Pine Mix  0.2 ml (Volume)  1:20 Concentration -- Walnut, Black Pollen  0.5 ml (Volume)  1:10 Concentration -- Dog Epithelia    4.2  ml Extract Subtotal  0.8  ml Diluent  5.0  ml Maintenance Total   Schedule:  B   Blue Vial (1:100,000): Schedule B (6 doses)  Yellow Vial (1:10,000): Schedule B (6 doses)  Green Vial (1:1,000): Schedule B (6 doses)  Red Vial (1:100): Schedule A (10 doses)   Special Instructions: 1 inj/week

## 2021-01-03 NOTE — Telephone Encounter (Signed)
Great. Thank you.

## 2021-01-03 NOTE — Progress Notes (Signed)
VIALS MADE. EXP 01-03-22 

## 2021-01-04 DIAGNOSIS — J3089 Other allergic rhinitis: Secondary | ICD-10-CM | POA: Diagnosis not present

## 2021-01-23 ENCOUNTER — Other Ambulatory Visit: Payer: Self-pay

## 2021-01-23 ENCOUNTER — Ambulatory Visit (INDEPENDENT_AMBULATORY_CARE_PROVIDER_SITE_OTHER): Payer: BC Managed Care – PPO | Admitting: *Deleted

## 2021-01-23 DIAGNOSIS — J309 Allergic rhinitis, unspecified: Secondary | ICD-10-CM | POA: Diagnosis not present

## 2021-01-23 NOTE — Progress Notes (Signed)
Immunotherapy   Patient Details  Name: Marjarie Irion MRN: [id number] Date of Birth: 2001-02-15  01/23/2021  [name] started injections for  Pollen-Pet, Cat-Mite-Mold Following schedule: B  Frequency:1 time per week Epi-Pen:Epi-Pen Available  Consent signed and patient instructions given.  Patient started allergy injections and received 0.65mL of Pollen-Pet in the RUA and 0.86mL of Cat-Mite-Mold in the LUA. Patient waited 30 minutes and did not experience any issues.   Tyrek Lawhorn Fernandez-Vernon 01/23/2021, 11:50 AM

## 2021-01-30 ENCOUNTER — Ambulatory Visit (INDEPENDENT_AMBULATORY_CARE_PROVIDER_SITE_OTHER): Payer: BC Managed Care – PPO

## 2021-01-30 DIAGNOSIS — J309 Allergic rhinitis, unspecified: Secondary | ICD-10-CM

## 2021-02-02 ENCOUNTER — Other Ambulatory Visit: Payer: Self-pay | Admitting: Family Medicine

## 2021-02-15 ENCOUNTER — Ambulatory Visit (INDEPENDENT_AMBULATORY_CARE_PROVIDER_SITE_OTHER): Payer: BC Managed Care – PPO

## 2021-02-15 DIAGNOSIS — J309 Allergic rhinitis, unspecified: Secondary | ICD-10-CM | POA: Diagnosis not present

## 2021-02-24 ENCOUNTER — Ambulatory Visit (INDEPENDENT_AMBULATORY_CARE_PROVIDER_SITE_OTHER): Payer: BC Managed Care – PPO

## 2021-02-24 DIAGNOSIS — J309 Allergic rhinitis, unspecified: Secondary | ICD-10-CM | POA: Diagnosis not present

## 2021-03-09 ENCOUNTER — Ambulatory Visit (INDEPENDENT_AMBULATORY_CARE_PROVIDER_SITE_OTHER): Payer: BC Managed Care – PPO | Admitting: *Deleted

## 2021-03-09 DIAGNOSIS — J309 Allergic rhinitis, unspecified: Secondary | ICD-10-CM | POA: Diagnosis not present

## 2021-03-13 ENCOUNTER — Other Ambulatory Visit: Payer: Self-pay

## 2021-03-13 ENCOUNTER — Ambulatory Visit: Payer: BC Managed Care – PPO | Attending: Family Medicine | Admitting: Physical Therapy

## 2021-03-13 DIAGNOSIS — M545 Low back pain, unspecified: Secondary | ICD-10-CM | POA: Insufficient documentation

## 2021-03-13 DIAGNOSIS — G8929 Other chronic pain: Secondary | ICD-10-CM | POA: Diagnosis present

## 2021-03-13 DIAGNOSIS — M6281 Muscle weakness (generalized): Secondary | ICD-10-CM | POA: Insufficient documentation

## 2021-03-13 DIAGNOSIS — M2550 Pain in unspecified joint: Secondary | ICD-10-CM | POA: Insufficient documentation

## 2021-03-13 NOTE — Therapy (Signed)
[facility] Outpatient Rehabilitation [facility] [address] [place], [place], [address] Phone: [phone]   Fax:  [phone]  Physical Therapy Evaluation  Patient Details  Name: Cassandra Schultz MRN: [id number] Date of Birth: 2001/04/02 Referring Provider (PT): Horton Marshall, [place]   Encounter Date: 03/13/2021   PT End of Session - 03/13/21 1757     Visit Number 1    Number of Visits 8    Date for PT Re-Evaluation 04/17/21    Authorization Type BCBS    PT Start Time 1600    PT Stop Time 1645    PT Time Calculation (min) 45 min    Activity Tolerance Patient tolerated treatment well    Behavior During Therapy [facility] for tasks assessed/performed             Past Medical History:  Diagnosis Date   ADHD    Allergy    Anxiety    Depression    reports ADHD    Eczema    Raynaud's syndrome    Tremor of both hands     Past Surgical History:  Procedure Laterality Date   NO PAST SURGERIES     WISDOM TOOTH EXTRACTION      There were no vitals filed for this visit.    Subjective Assessment - 03/13/21 1603     Subjective Pt is familiar to me from previous episodes of PT.  A few months ago noticed pain with exercises 9demonstrates quadruped core.)  Prior she was standing up to 2 hours at a time, now she can barely stand 1 hour. She fatigues and has poor recovery, requiring several hours rest/supine following 2-4 hours of errands or incidental activity.   She has a cardiology referral (?POTS)  For the past 2-3 mos trying to do yoga to build a habit and even that 20 min makes her extremely tired.  Still working part time at [employer].    Pertinent History joimt hypermobility, dysautonomia?, chronic pain , ADHD, allergies    Limitations Lifting;Standing;Walking;House hold activities;Other (comment)   sleep   Diagnostic tests none recent    Patient Stated Goals Get accountability . how to modify in case a I have pain    Currently in Pain? Yes    Pain Score 0-No  pain    Pain Location Back    Pain Orientation Lower;Mid    Pain Descriptors / Indicators Tightness    Pain Type Chronic pain    Pain Radiating Towards none today, can be in mid back    Pain Onset More than a month ago    Pain Frequency Intermittent    Aggravating Factors  doing the wrong ex    Pain Relieving Factors rest    Effect of Pain on Daily Activities fatigue, poor recovery    Multiple Pain Sites No                [facility] PT Assessment - 03/13/21 0001       Assessment   Medical Diagnosis chronic pain    Referring Provider (PT) Horton Marshall, PA    Onset Date/Surgical Date --   chronic   Next MD Visit various providers    Prior Therapy Yes 12/2019      Precautions   Precautions None    Precaution Comments joint hypermobilty, dyautonomia      Restrictions   Weight Bearing Restrictions No      Balance Screen   Has the patient fallen in the past 6 months No  Home Environment   Living Environment Private residence    Living Arrangements Non-relatives/Friends    Additional Comments 3 roommates      Prior Function   Level of Independence Independent    Vocation Part time employment    Vocation Requirements [employer]    Leisure limited      Cognition   Overall Cognitive Status Within Functional Limits for tasks assessed      Observation/Other Assessments   Scoliosis mild thoracolumbar curve to R    Focus on Therapeutic Outcomes (FOTO)  NT time constraints      Sensation   Light Touch Appears Intact      Posture/Postural Control   Posture Comments L hip higher than Rt in standing      AROM   Lumbar Flexion palm to floor    Lumbar Extension pain, WNL    Lumbar - Right Side Bend pain on L    Lumbar - Left Side Bend pain on L    Lumbar - Right Rotation WNL    Lumbar - Left Rotation WNL      Strength   Right Hip Flexion 5/5    Right Hip Extension 5/5    Left Hip Flexion 5/5    Left Hip Extension 4/5    Left Hip ABduction 4/5    Right Knee  Flexion 5/5    Right Knee Extension 5/5    Left Knee Flexion 5/5    Left Knee Extension 5/5      Palpation   Palpation comment spasm L Quadratus lumborum      Transfers   Comments WFL      Ambulation/Gait   Gait Comments WNL normal pace and pattern             Lt sided QL stretch with manual overpressure and trigger point release as tolerated.   Objective measurements completed on examination: See above findings.         PT Education - 03/13/21 1757     Education Details dry needling, lifting wgts vs pilates, POC, HEP , QL    Person(s) Educated Patient    Methods Explanation;Verbal cues;Handout    Comprehension Verbalized understanding;Returned demonstration                 PT Long Term Goals - 03/13/21 1758       PT LONG TERM GOAL #1   Title She will be able to demo final HEP with good technique including modifications for pain in various joints.    Time 5    Period Weeks    Status New    Target Date 04/17/21      PT LONG TERM GOAL #2   Title Pt will be able to stand 4-5 hours with improved performance and reduced pain to moderate (not severe)    Time 5    Period Weeks    Status New    Target Date 04/17/21      PT LONG TERM GOAL #3   Title Pt will be able to show normal pain free AROM in trunk, lumbar spine    Time 5    Period Weeks    Status New    Target Date 04/17/21      PT LONG TERM GOAL #4   Title Pt will be able to hold plank position for 30 sec with no pain in back, good form to demo increased core strength    Time 5    Period Weeks  Status New    Target Date 04/17/21      PT LONG TERM GOAL #5   Title pt will require no change in routine the day following a day of exercising < 30 min .    Baseline needs to rest and nap for hours    Time 5    Period Weeks    Status New    Target Date 04/17/21                    Plan - 03/13/21 1640     Clinical Impression Statement Patient presents for mod complexity eval of  bilateral lumbar pain with increasd spasm on L side of her back which is chronic and variable, multifactorial. She has good strength, has been carrying out positive habits and lifestyle changes to improve  her health. She does have lumbar hypermobility which was likely the cause of this most recent flare up.  She likely compensated with overrecruitment strategies causing pain and spasm in Lt quadratus lumborum.  She was shown how to stretch this particular muscle today She will benefit from skilled PT to improve her comfort at rest and work, provide HEP and reduce the need for sleep, total rest following basic activities.    Personal Factors and Comorbidities Behavior Pattern;Comorbidity 2    Comorbidities joint hypermobility, dysautonomia    Examination-Activity Limitations Bed Mobility;Squat;Stand;Locomotion Level;Lift;Bend    Examination-Participation Restrictions Interpersonal Relationship;Occupation;Community Activity;Laundry;Cleaning    Stability/Clinical Decision Making Evolving/Moderate complexity    Clinical Decision Making Moderate    Rehab Potential Excellent    PT Frequency 2x / week    PT Duration 4 weeks   4-5 weeks   PT Treatment/Interventions Dry needling;Aquatic Therapy;ADLs/Self Care Home Management;Cryotherapy;Therapeutic exercise;Patient/family education;Manual techniques;Taping;Functional mobility training;Moist Heat;Therapeutic activities;Electrical Stimulation;Neuromuscular re-education    PT Next Visit Plan check HEP, DN for QL/manual. core (pilates)    PT Home Exercise Plan QL stretching    Consulted and Agree with Plan of Care Patient             Patient will benefit from skilled therapeutic intervention in order to improve the following deficits and impairments:  Decreased endurance, Pain, Postural dysfunction, Increased fascial restricitons, Decreased strength, Decreased activity tolerance, Impaired flexibility  Visit Diagnosis: Chronic bilateral low back pain  without sciatica  Generalized joint pain  Muscle weakness (generalized)     Problem List Patient Active Problem List   Diagnosis Date Noted   Seasonal and perennial allergic rhinitis 09/21/2020   Seasonal allergic conjunctivitis 09/21/2020   Intrinsic atopic dermatitis 09/21/2020   Numbness and tingling of both legs - comes and goes after activity and onset of pain 08/03/2019   Fatigue 05/27/2019   Low back pain 02/24/2019   Bilateral knee pain 02/24/2019   Bilateral hip pain 02/24/2019   Acute bilateral ankle pain 02/24/2019   Healthcare maintenance 12/23/2018   Generalized anxiety disorder 04/03/2018   Attention deficit hyperactivity disorder (ADHD), combined type, severe 04/03/2018   Mild recurrent major depression (HCC) 04/03/2018   Developmental coordination disorder 04/03/2018   Allergy with anaphylaxis due to food 02/02/2016   Allergic rhinitis due to pollen 02/02/2016    Amparo Donalson, PT 03/13/2021, 6:24 PM  [facility] Outpatient Rehabilitation [facility] [address] [place], [place], [address] Phone: [phone]   Fax:  [phone]  Name: Graycie Halley MRN: [id number] Date of Birth: 10/15/2000  [name], PT 03/13/21 6:25 PM Phone: [phone] Fax: [phone]

## 2021-03-13 NOTE — Patient Instructions (Addendum)
Access Code: [email or web address] URL: [email or web address] Date: 03/13/2021 Prepared by: [name]  Exercises Standing Quadratus Lumborum Stretch with Doorway - 1 x daily - 7 x weekly - 1 sets - 3 reps - 30-60 hold Sidelying Quadratus Lumborum Stretch on Table - 1 x daily - 7 x weekly - 1 sets - 1-3 reps - 3-10 min hold Seated Quadratus Lumborum Stretch with Forward Bend - 1 x daily - 7 x weekly - 1 sets - 3 reps - 30 hold  Instructed to do 1-2 of these exercises 1-2 times per day, not all 3

## 2021-03-27 ENCOUNTER — Ambulatory Visit (INDEPENDENT_AMBULATORY_CARE_PROVIDER_SITE_OTHER): Payer: BC Managed Care – PPO

## 2021-03-27 DIAGNOSIS — J309 Allergic rhinitis, unspecified: Secondary | ICD-10-CM

## 2021-04-04 ENCOUNTER — Ambulatory Visit (INDEPENDENT_AMBULATORY_CARE_PROVIDER_SITE_OTHER): Payer: BC Managed Care – PPO

## 2021-04-04 DIAGNOSIS — J309 Allergic rhinitis, unspecified: Secondary | ICD-10-CM

## 2021-04-05 ENCOUNTER — Encounter: Payer: Self-pay | Admitting: Physical Therapy

## 2021-04-05 ENCOUNTER — Other Ambulatory Visit: Payer: Self-pay

## 2021-04-05 ENCOUNTER — Ambulatory Visit: Payer: BC Managed Care – PPO | Attending: Family Medicine | Admitting: Physical Therapy

## 2021-04-05 DIAGNOSIS — M6281 Muscle weakness (generalized): Secondary | ICD-10-CM | POA: Diagnosis present

## 2021-04-05 DIAGNOSIS — M2550 Pain in unspecified joint: Secondary | ICD-10-CM | POA: Diagnosis present

## 2021-04-05 DIAGNOSIS — G8929 Other chronic pain: Secondary | ICD-10-CM | POA: Diagnosis present

## 2021-04-05 DIAGNOSIS — M545 Low back pain, unspecified: Secondary | ICD-10-CM | POA: Insufficient documentation

## 2021-04-05 NOTE — Therapy (Signed)
[facility] Outpatient Rehabilitation [facility] [address] [place], [place], [address] Phone: [phone]   Fax:  [phone]  Physical Therapy Treatment  Patient Details  Name: Cassandra Schultz MRN: [id number] Date of Birth: January 10, 2001 Referring Provider (PT): Horton Marshall, [place]   Encounter Date: 04/05/2021   PT End of Session - 04/05/21 1326     Visit Number 2    Number of Visits 8    Date for PT Re-Evaluation 04/17/21    Authorization Type BCBS    PT Start Time 1320    PT Stop Time 1400    PT Time Calculation (min) 40 min             Past Medical History:  Diagnosis Date   ADHD    Allergy    Anxiety    Depression    reports ADHD    Eczema    Raynaud's syndrome    Tremor of both hands     Past Surgical History:  Procedure Laterality Date   NO PAST SURGERIES     WISDOM TOOTH EXTRACTION      There were no vitals filed for this visit.   Subjective Assessment - 04/05/21 1321     Subjective Pt reports a little lower back pain on arrival due to trying to sit up straight.  Yesterday and the day before had really bad soreness pain in quads but it has resolved.    Currently in Pain? Yes    Pain Score 3     Pain Location Back    Pain Orientation Lower    Pain Descriptors / Indicators --   stiffness   Pain Type Chronic pain    Aggravating Factors  sitting up straight    Pain Relieving Factors rest                 [facility] Adult PT Treatment/Exercise - 04/05/21 0001       Lumbar Exercises: Stretches   Double Knee to Chest Stretch 60 seconds    Lower Trunk Rotation 2 reps      Lumbar Exercises: Supine   Pelvic Tilt 10 reps    Pelvic Tilt Limitations 5 sec with ball squeeze- began to have pain in right buttock and low back    Dead Bug Limitations table top with knee extensions- min rom    Straight Leg Raises Limitations began on elevated surface - unable to remain stable- modified to bent knee raises    Other Supine Lumbar Exercises  mat-  hip extension- alternating in prone    Other Supine Lumbar Exercises Bench bridge- pt independent demonstration of a exercise she chooses to do at home- x 10      Lumbar Exercises: Sidelying   Clam 15 reps    Hip Abduction 15 reps                          PT Long Term Goals - 03/13/21 1758       PT LONG TERM GOAL #1   Title She will be able to demo final HEP with good technique including modifications for pain in various joints.    Time 5    Period Weeks    Status New    Target Date 04/17/21      PT LONG TERM GOAL #2   Title Pt will be able to stand 4-5 hours with improved performance and reduced pain to moderate (not severe)    Time 5  Period Weeks    Status New    Target Date 04/17/21      PT LONG TERM GOAL #3   Title Pt will be able to show normal pain free AROM in trunk, lumbar spine    Time 5    Period Weeks    Status New    Target Date 04/17/21      PT LONG TERM GOAL #4   Title Pt will be able to hold plank position for 30 sec with no pain in back, good form to demo increased core strength    Time 5    Period Weeks    Status New    Target Date 04/17/21      PT LONG TERM GOAL #5   Title pt will require no change in routine the day following a day of exercising < 30 min .    Baseline needs to rest and nap for hours    Time 5    Period Weeks    Status New    Target Date 04/17/21                   Plan - 04/05/21 1356     Clinical Impression Statement Pt reports non compliance with HEP stretches because that pain has gone away. Began mat based core stabilization. Pt requests to not perform a bridge and instead demonstrated a bench bridge which she prefers. Pt was monitored for neutral spine and exercise tolerance. She had some increased LBP with pelvic tilits and was unable to perform SLR without LBP.    PT Treatment/Interventions Dry needling;Aquatic Therapy;ADLs/Self Care Home Management;Cryotherapy;Therapeutic  exercise;Patient/family education;Manual techniques;Taping;Functional mobility training;Moist Heat;Therapeutic activities;Electrical Stimulation;Neuromuscular re-education    PT Next Visit Plan check HEP, DN for QL/manual. core (pilates)- check tolerance to last session, she wants to use elliptical and start weight training. establish HEP    PT Home Exercise Plan QL stretching             Patient will benefit from skilled therapeutic intervention in order to improve the following deficits and impairments:  Decreased endurance, Pain, Postural dysfunction, Increased fascial restricitons, Decreased strength, Decreased activity tolerance, Impaired flexibility  Visit Diagnosis: Chronic bilateral low back pain without sciatica  Muscle weakness (generalized)  Generalized joint pain     Problem List Patient Active Problem List   Diagnosis Date Noted   Seasonal and perennial allergic rhinitis 09/21/2020   Seasonal allergic conjunctivitis 09/21/2020   Intrinsic atopic dermatitis 09/21/2020   Numbness and tingling of both legs - comes and goes after activity and onset of pain 08/03/2019   Fatigue 05/27/2019   Low back pain 02/24/2019   Bilateral knee pain 02/24/2019   Bilateral hip pain 02/24/2019   Acute bilateral ankle pain 02/24/2019   Healthcare maintenance 12/23/2018   Generalized anxiety disorder 04/03/2018   Attention deficit hyperactivity disorder (ADHD), combined type, severe 04/03/2018   Mild recurrent major depression (HCC) 04/03/2018   Developmental coordination disorder 04/03/2018   Allergy with anaphylaxis due to food 02/02/2016   Allergic rhinitis due to pollen 02/02/2016    Sherrie Mustache, PTA 04/05/2021, 2:00 PM  [facility] Outpatient Rehabilitation [facility] [address] [place], [place], [address] Phone: [phone]   Fax:  [phone]  Name: Cassandra Schultz MRN: [id number] Date of Birth: Jan 15, 2001

## 2021-04-07 ENCOUNTER — Encounter: Payer: Self-pay | Admitting: Physical Therapy

## 2021-04-07 ENCOUNTER — Other Ambulatory Visit: Payer: Self-pay

## 2021-04-07 ENCOUNTER — Ambulatory Visit: Payer: BC Managed Care – PPO | Admitting: Physical Therapy

## 2021-04-07 DIAGNOSIS — M6281 Muscle weakness (generalized): Secondary | ICD-10-CM

## 2021-04-07 DIAGNOSIS — M2550 Pain in unspecified joint: Secondary | ICD-10-CM

## 2021-04-07 DIAGNOSIS — M545 Low back pain, unspecified: Secondary | ICD-10-CM | POA: Diagnosis not present

## 2021-04-07 DIAGNOSIS — G8929 Other chronic pain: Secondary | ICD-10-CM

## 2021-04-07 NOTE — Therapy (Signed)
[facility] Outpatient Rehabilitation [facility] [address] [place], [place], [address] Phone: [phone]   Fax:  [phone]  Physical Therapy Treatment  Patient Details  Name: Cassandra Schultz MRN: [id number] Date of Birth: 2001/03/07 Referring Provider (PT): Horton Marshall, [place]   Encounter Date: 04/07/2021   PT End of Session - 04/07/21 1104     Visit Number 3    Number of Visits 8    Date for PT Re-Evaluation 04/17/21    Authorization Type BCBS    PT Start Time 1100    PT Stop Time 1144    PT Time Calculation (min) 44 min    Activity Tolerance Patient tolerated treatment well    Behavior During Therapy [facility] for tasks assessed/performed             Past Medical History:  Diagnosis Date   ADHD    Allergy    Anxiety    Depression    reports ADHD    Eczema    Raynaud's syndrome    Tremor of both hands     Past Surgical History:  Procedure Laterality Date   NO PAST SURGERIES     WISDOM TOOTH EXTRACTION      There were no vitals filed for this visit.   Subjective Assessment - 04/07/21 1101     Subjective Rt knee discomfort.  Had a good workout with PTA.  She is interested in doing core and upper back.    Currently in Pain? No/denies             Elliptical 5 min L 7 ramp and L 5 resistance Supine isometric ball squeeze x 10 , legs on high bolster Supine hamstring bias bridges with ball squeeze x 10   To 75% range  Ball under pelvis alt. Knee extension x 10 each Unilateral horizontal 5 lbs x 10 each UE for antirotation Upper ab curl x 10   Hip hinge, dead lift x 15 , 15 lbs KB  Standing shoulder ext with spring board x 10   Bicep curl in isometric squat/hold x 10  Single leg hinge with alternating shoulder extension x 10    Then row x 10     PT Long Term Goals - 04/07/21 1135       PT LONG TERM GOAL #1   Title She will be able to demo final HEP with good technique including modifications for pain in various joints.     Baseline needs compliance    Status On-going      PT LONG TERM GOAL #2   Title Pt will be able to stand 4-5 hours with improved performance and reduced pain to moderate (not severe)    Status On-going      PT LONG TERM GOAL #3   Title Pt will be able to show normal pain free AROM in trunk, lumbar spine    Status On-going      PT LONG TERM GOAL #4   Title Pt will be able to hold plank position for 30 sec with no pain in back, good form to demo increased core strength    Baseline sideplank 20 sec    Status On-going      PT LONG TERM GOAL #5   Title pt will require no change in routine the day following a day of exercising < 30 min .    Baseline needed a 2 hour nap last time    Status On-going  Plan - 04/07/21 1127     Clinical Impression Statement Patient overall doing well, reports fear of reinjury, fatigue and limited physical activity other than work.  Due to the 3 week gap I asked her to make more appts.  Encouraged small range, high rep endurance type exercises.  She has excellent form but admits to working through pain at times.    PT Treatment/Interventions Dry needling;Aquatic Therapy;ADLs/Self Care Home Management;Cryotherapy;Therapeutic exercise;Patient/family education;Manual techniques;Taping;Functional mobility training;Moist Heat;Therapeutic activities;Electrical Stimulation;Neuromuscular re-education    PT Next Visit Plan check HEP, DN for QL/manual. core (pilates)- check tolerance to last session, she wants to use elliptical and start weight training. establish HEP    PT Home Exercise Plan Access Code: [email or web address]  URL: [email or web address]  Date: 04/07/2021  Prepared by: [name]    Exercises  Standing Quadratus Lumborum Stretch with Doorway - 1 x daily - 7 x weekly - 1 sets - 3 reps - 30-60 hold  Sidelying Quadratus Lumborum Stretch on Table - 1 x daily - 7 x weekly - 1 sets - 1-3 reps - 3-10 min hold  Seated Quadratus Lumborum  Stretch with Forward Bend - 1 x daily - 7 x weekly - 1 sets - 3 reps - 30 hold  Supine Hip Adduction Isometric with Ball - 1 x daily - 7 x weekly - 2 sets - 10 reps - 5 hold  Supine Bridge with Mini Swiss Ball Between Knees - 1 x daily - 7 x weekly - 2 sets - 10 reps - 5 hold  Side Plank on Knees - 1 x daily - 7 x weekly - 1 sets - 5 reps - 30 hold  Deadlift with Resistance - 1 x daily - 7 x weekly - 2 sets - 10 reps - 5 hold    Consulted and Agree with Plan of Care Patient             Patient will benefit from skilled therapeutic intervention in order to improve the following deficits and impairments:  Decreased endurance, Pain, Postural dysfunction, Increased fascial restricitons, Decreased strength, Decreased activity tolerance, Impaired flexibility  Visit Diagnosis: Chronic bilateral low back pain without sciatica  Muscle weakness (generalized)  Generalized joint pain     Problem List Patient Active Problem List   Diagnosis Date Noted   Seasonal and perennial allergic rhinitis 09/21/2020   Seasonal allergic conjunctivitis 09/21/2020   Intrinsic atopic dermatitis 09/21/2020   Numbness and tingling of both legs - comes and goes after activity and onset of pain 08/03/2019   Fatigue 05/27/2019   Low back pain 02/24/2019   Bilateral knee pain 02/24/2019   Bilateral hip pain 02/24/2019   Acute bilateral ankle pain 02/24/2019   Healthcare maintenance 12/23/2018   Generalized anxiety disorder 04/03/2018   Attention deficit hyperactivity disorder (ADHD), combined type, severe 04/03/2018   Mild recurrent major depression (HCC) 04/03/2018   Developmental coordination disorder 04/03/2018   Allergy with anaphylaxis due to food 02/02/2016   Allergic rhinitis due to pollen 02/02/2016    Kezia Benevides, PT 04/07/2021, 11:56 AM  [facility] [address] [place], [place], [address] Phone: [phone]   Fax:  [phone]  Name:  Cassandra Schultz MRN: [id number] Date of Birth: 2000-12-22   [name], PT 04/07/21 11:59 AM Phone: [phone] Fax: [phone]

## 2021-04-07 NOTE — Patient Instructions (Signed)
Access Code: [email or web address] URL: [email or web address] Date: 04/07/2021 Prepared by: [name]  Exercises Standing Quadratus Lumborum Stretch with Doorway - 1 x daily - 7 x weekly - 1 sets - 3 reps - 30-60 hold Sidelying Quadratus Lumborum Stretch on Table - 1 x daily - 7 x weekly - 1 sets - 1-3 reps - 3-10 min hold Seated Quadratus Lumborum Stretch with Forward Bend - 1 x daily - 7 x weekly - 1 sets - 3 reps - 30 hold Supine Hip Adduction Isometric with Ball - 1 x daily - 7 x weekly - 2 sets - 10 reps - 5 hold Supine Bridge with Mini Swiss Ball Between Knees - 1 x daily - 7 x weekly - 2 sets - 10 reps - 5 hold Side Plank on Knees - 1 x daily - 7 x weekly - 1 sets - 5 reps - 30 hold Deadlift with Resistance - 1 x daily - 7 x weekly - 2 sets - 10 reps - 5 hold

## 2021-04-10 ENCOUNTER — Other Ambulatory Visit: Payer: Self-pay

## 2021-04-10 ENCOUNTER — Encounter: Payer: Self-pay | Admitting: Physical Therapy

## 2021-04-10 ENCOUNTER — Ambulatory Visit: Payer: BC Managed Care – PPO | Admitting: Physical Therapy

## 2021-04-10 DIAGNOSIS — M2550 Pain in unspecified joint: Secondary | ICD-10-CM

## 2021-04-10 DIAGNOSIS — M545 Low back pain, unspecified: Secondary | ICD-10-CM | POA: Diagnosis not present

## 2021-04-10 DIAGNOSIS — M6281 Muscle weakness (generalized): Secondary | ICD-10-CM

## 2021-04-10 DIAGNOSIS — G8929 Other chronic pain: Secondary | ICD-10-CM

## 2021-04-10 NOTE — Therapy (Signed)
[facility] [address] [place], [place], [address] Phone: [phone]   Fax:  [phone]  Physical Therapy Treatment  Patient Details  Name: Cassandra Schultz MRN: [id number] Date of Birth: June 10, 2001 Referring Provider (PT): Horton Marshall, [place]   Encounter Date: 04/10/2021   PT End of Session - 04/10/21 1119     Visit Number 4    Number of Visits 8    Date for PT Re-Evaluation 04/17/21    Authorization Type BCBS    PT Start Time 1113   pt late then restroom   PT Stop Time 1147    PT Time Calculation (min) 34 min    Activity Tolerance Patient tolerated treatment well    Behavior During Therapy [facility] for tasks assessed/performed             Past Medical History:  Diagnosis Date   ADHD    Allergy    Anxiety    Depression    reports ADHD    Eczema    Raynaud's syndrome    Tremor of both hands     Past Surgical History:  Procedure Laterality Date   NO PAST SURGERIES     WISDOM TOOTH EXTRACTION      There were no vitals filed for this visit.   Subjective Assessment - 04/10/21 1115     Subjective I am tired.  Back hurts when I move a certain way. I feel like I have no upper body strength.    Currently in Pain? Yes    Pain Score 4     Pain Location Back    Pain Orientation Lower    Pain Descriptors / Indicators Tightness;Aching    Pain Type Chronic pain    Pain Onset More than a month ago    Pain Frequency Intermittent    Aggravating Factors  moving a certain way    Pain Relieving Factors rest                Skilled therapy interventions:   Therapeutic Exercise:  Supine core and UE   Triceps 2 x 12 with 4 lbs 1 set with legs in table top   Chest press 2 x 10, 4 lbs , 1 set in mini bridge   Lat pull over 8 lbs x 10 legs in table top   Table top legs with 8 lbs chest hgt , knee ext x 8    Cat/cow x 5   Bird dog hold with hip ext/shoulder flexion x 8   Single leg balance with 8 lbs dumbbell  pass around body x 10 each leg     Single leg hinge with shoulder row 8 lbs x 10 each LE     PT Education - 04/10/21 1142     Education Details integrating core in to weight training    Person(s) Educated Patient    Methods Explanation    Comprehension Verbalized understanding                 PT Long Term Goals - 04/07/21 1135       PT LONG TERM GOAL #1   Title She will be able to demo final HEP with good technique including modifications for pain in various joints.    Baseline needs compliance    Status On-going      PT LONG TERM GOAL #2   Title Pt will be able to stand 4-5 hours with improved performance and reduced pain to moderate (not severe)  Status On-going      PT LONG TERM GOAL #3   Title Pt will be able to show normal pain free AROM in trunk, lumbar spine    Status On-going      PT LONG TERM GOAL #4   Title Pt will be able to hold plank position for 30 sec with no pain in back, good form to demo increased core strength    Baseline sideplank 20 sec    Status On-going      PT LONG TERM GOAL #5   Title pt will require no change in routine the day following a day of exercising < 30 min .    Baseline needed a 2 hour nap last time    Status On-going                   Plan - 04/10/21 1122     Clinical Impression Statement Able to integrate upper body into core work. She has good form, asks appropriate questions and understands concepts.  She is motivated to get stronger but does need more accountability when not in PT to do her HEP. She needs rare cues for form related to alignment.    PT Treatment/Interventions Dry needling;Aquatic Therapy;ADLs/Self Care Home Management;Cryotherapy;Therapeutic exercise;Patient/family education;Manual techniques;Taping;Functional mobility training;Moist Heat;Therapeutic activities;Electrical Stimulation;Neuromuscular re-education    PT Next Visit Plan try wgt machines for  upper body/lower body .  consider DN for  QL/manual. core (pilates).    PT Home Exercise Plan Access Code: [email or web address]  URL: [email or web address]  Date: 04/07/2021  Prepared by: [name]    Exercises  Standing Quadratus Lumborum Stretch with Doorway - 1 x daily - 7 x weekly - 1 sets - 3 reps - 30-60 hold  Sidelying Quadratus Lumborum Stretch on Table - 1 x daily - 7 x weekly - 1 sets - 1-3 reps - 3-10 min hold  Seated Quadratus Lumborum Stretch with Forward Bend - 1 x daily - 7 x weekly - 1 sets - 3 reps - 30 hold  Supine Hip Adduction Isometric with Ball - 1 x daily - 7 x weekly - 2 sets - 10 reps - 5 hold  Supine Bridge with Mini Swiss Ball Between Knees - 1 x daily - 7 x weekly - 2 sets - 10 reps - 5 hold  Side Plank on Knees - 1 x daily - 7 x weekly - 1 sets - 5 reps - 30 hold  Deadlift with Resistance - 1 x daily - 7 x weekly - 2 sets - 10 reps - 5 hold    Consulted and Agree with Plan of Care Patient             Patient will benefit from skilled therapeutic intervention in order to improve the following deficits and impairments:  Decreased endurance, Pain, Postural dysfunction, Increased fascial restricitons, Decreased strength, Decreased activity tolerance, Impaired flexibility  Visit Diagnosis: Chronic bilateral low back pain without sciatica  Muscle weakness (generalized)  Generalized joint pain     Problem List Patient Active Problem List   Diagnosis Date Noted   Seasonal and perennial allergic rhinitis 09/21/2020   Seasonal allergic conjunctivitis 09/21/2020   Intrinsic atopic dermatitis 09/21/2020   Numbness and tingling of both legs - comes and goes after activity and onset of pain 08/03/2019   Fatigue 05/27/2019   Low back pain 02/24/2019   Bilateral knee pain 02/24/2019   Bilateral hip pain 02/24/2019   Acute bilateral ankle pain 02/24/2019   Healthcare  maintenance 12/23/2018   Generalized anxiety disorder 04/03/2018   Attention deficit hyperactivity disorder (ADHD), combined type, severe  04/03/2018   Mild recurrent major depression (HCC) 04/03/2018   Developmental coordination disorder 04/03/2018   Allergy with anaphylaxis due to food 02/02/2016   Allergic rhinitis due to pollen 02/02/2016    Rosalynd Mcwright, PT 04/10/2021, 11:54 AM  [facility] [address] [place], [place], [address] Phone: [phone]   Fax:  [phone]  Name: Cassandra Schultz MRN: [id number] Date of Birth: 17-Jan-2001  [name], PT 04/10/21 11:54 AM Phone: [phone] Fax: [phone]

## 2021-04-12 ENCOUNTER — Encounter: Payer: Self-pay | Admitting: Physical Therapy

## 2021-04-12 ENCOUNTER — Ambulatory Visit: Payer: BC Managed Care – PPO | Admitting: Physical Therapy

## 2021-04-12 ENCOUNTER — Other Ambulatory Visit: Payer: Self-pay

## 2021-04-12 DIAGNOSIS — M545 Low back pain, unspecified: Secondary | ICD-10-CM | POA: Diagnosis not present

## 2021-04-12 DIAGNOSIS — G8929 Other chronic pain: Secondary | ICD-10-CM

## 2021-04-12 DIAGNOSIS — M2550 Pain in unspecified joint: Secondary | ICD-10-CM

## 2021-04-12 DIAGNOSIS — M6281 Muscle weakness (generalized): Secondary | ICD-10-CM

## 2021-04-12 NOTE — Therapy (Signed)
[facility]patient Rehabilitation [facility] [address] [place], [place], [address] Phone: [phone]   Fax:  [phone]  Physical Therapy Treatment  Patient Details  Name: Cassandra Schultz MRN: [id number] Date of Birth: Sep 16, 2000 Referring Provider (PT): Horton Marshall, [place]   Encounter Date: 04/12/2021   PT End of Session - 04/12/21 1234     Visit Number 5    Number of Visits 8    Date for PT Re-Evaluation 04/17/21    Authorization Type BCBS    PT Start Time 1230    PT Stop Time 1300    PT Time Calculation (min) 30 min             Past Medical History:  Diagnosis Date   ADHD    Allergy    Anxiety    Depression    reports ADHD    Eczema    Raynaud's syndrome    Tremor of both hands     Past Surgical History:  Procedure Laterality Date   NO PAST SURGERIES     WISDOM TOOTH EXTRACTION      There were no vitals filed for this visit.   Subjective Assessment - 04/12/21 1234     Subjective I felt normal tired after last time. My abdominals are very sore. I have to work after this so prefer not to got too hard today.    Currently in Pain? No/denies                 [facility] Adult PT Treatment/Exercise - 04/12/21 0001       Therapeutic Activites    Therapeutic Activities Lifting    Lifting 15# KB deadlift x15      Lumbar Exercises: Aerobic   Elliptical Resistance 1 Ramp 6 x 5 minutes      Lumbar Exercises: Machines for Strengthening   Other Lumbar Machine Exercise chest press 10# smaller ROM 15x 1 seated ROW 15# x 15, seated leg press 35# x 10 , horizontal 40# x 15      Lumbar Exercises: Sidelying   Clam -- (P)     Clam Limitations 12 reps each (P)     Hip Abduction 15 reps      Lumbar Exercises: Quadruped   Opposite Arm/Leg Raise 10 reps                          PT Long Term Goals - 04/07/21 1135       PT LONG TERM GOAL #1   Title She will be able to demo final HEP with good technique including  modifications for pain in various joints.    Baseline needs compliance    Status On-going      PT LONG TERM GOAL #2   Title Pt will be able to stand 4-5 hours with improved performance and reduced pain to moderate (not severe)    Status On-going      PT LONG TERM GOAL #3   Title Pt will be able to show normal pain free AROM in trunk, lumbar spine    Status On-going      PT LONG TERM GOAL #4   Title Pt will be able to hold plank position for 30 sec with no pain in back, good form to demo increased core strength    Baseline sideplank 20 sec    Status On-going      PT LONG TERM GOAL #5   Title pt will require no  change in routine the day following a day of exercising < 30 min .    Baseline needed a 2 hour nap last time    Status On-going                   Plan - 04/12/21 1421     Clinical Impression Statement Pt arrives reporting abdominal soreness from previous session. She is agreeable to begin gym machines today and tolerated it well with low weight.    PT Treatment/Interventions Dry needling;Aquatic Therapy;ADLs/Self Care Home Management;Cryotherapy;Therapeutic exercise;Patient/family education;Manual techniques;Taping;Functional mobility training;Moist Heat;Therapeutic activities;Electrical Stimulation;Neuromuscular re-education    PT Next Visit Plan try wgt machines for  upper body/lower body .  consider DN for QL/manual. core (pilates).    PT Home Exercise Plan Access Code: [email or web address]  URL: [email or web address]  Date: 04/07/2021  Prepared by: [name]    Exercises  Standing Quadratus Lumborum Stretch with Doorway - 1 x daily - 7 x weekly - 1 sets - 3 reps - 30-60 hold  Sidelying Quadratus Lumborum Stretch on Table - 1 x daily - 7 x weekly - 1 sets - 1-3 reps - 3-10 min hold  Seated Quadratus Lumborum Stretch with Forward Bend - 1 x daily - 7 x weekly - 1 sets - 3 reps - 30 hold  Supine Hip Adduction Isometric with Ball - 1 x daily - 7 x weekly - 2 sets - 10  reps - 5 hold  Supine Bridge with Mini Swiss Ball Between Knees - 1 x daily - 7 x weekly - 2 sets - 10 reps - 5 hold  Side Plank on Knees - 1 x daily - 7 x weekly - 1 sets - 5 reps - 30 hold  Deadlift with Resistance - 1 x daily - 7 x weekly - 2 sets - 10 reps - 5 hold             Patient will benefit from skilled therapeutic intervention in order to improve the following deficits and impairments:  Decreased endurance, Pain, Postural dysfunction, Increased fascial restricitons, Decreased strength, Decreased activity tolerance, Impaired flexibility  Visit Diagnosis: Chronic bilateral low back pain without sciatica  Muscle weakness (generalized)  Generalized joint pain     Problem List Patient Active Problem List   Diagnosis Date Noted   Seasonal and perennial allergic rhinitis 09/21/2020   Seasonal allergic conjunctivitis 09/21/2020   Intrinsic atopic dermatitis 09/21/2020   Numbness and tingling of both legs - comes and goes after activity and onset of pain 08/03/2019   Fatigue 05/27/2019   Low back pain 02/24/2019   Bilateral knee pain 02/24/2019   Bilateral hip pain 02/24/2019   Acute bilateral ankle pain 02/24/2019   Healthcare maintenance 12/23/2018   Generalized anxiety disorder 04/03/2018   Attention deficit hyperactivity disorder (ADHD), combined type, severe 04/03/2018   Mild recurrent major depression (HCC) 04/03/2018   Developmental coordination disorder 04/03/2018   Allergy with anaphylaxis due to food 02/02/2016   Allergic rhinitis due to pollen 02/02/2016    Sherrie Mustache, PTA 04/12/2021, 2:22 PM  [facility]patient [facility] [address] [place], [place], [address] Phone: [phone]   Fax:  [phone]  Name: Mandi Mattioli MRN: [id number] Date of Birth: 2000-12-01

## 2021-04-16 NOTE — Progress Notes (Signed)
Cardiology Office Note:    Date:  04/16/2021   ID:  Cassandra Schultz, DOB Jun 11, 2001, MRN [id number]  PCP:  Wilfrid Lund, PA   [facility] Providers Cardiologist:  None     Referring MD: Wilfrid Lund, PA   No chief complaint on file. Palpitations, tachycardia   History of Present Illness:    Cassandra Schultz is a 20 y.o. female with a hx of ADHD on adderal, who comes in with palpitations.  [name] notes at work her heart rate is in the 130s to 140s. This has been going on since highschool. She notes bouts of fast heart rates in highschool.  She has LH, dizziness. She denies syncope. If she stands from sitting  she notes the [facility] and dizzy. It happens occasionally. She drinks caffeine. She has cut back from 16 oz per day. She takes adderal she was on 30 mg, she could not eat and lost weight. It was reduced to adderal 20 mg daily. She works part time at [employer]. She has no family hx of SCD. She has no hx of congential heart disease. She thinks her father had MI in his 74s.Her half brother has whole in his heart.    Orthostatics:  Lying 100 / 63 mmHg pulse 88 Sitting 108/71 mmHg pulse 111 Stand 95/63 mmHg Stand (3 min) 10/66 mg pulse 113  TTE 04/04/2020 Normal LVEF, RV No valve dx No pulmonary htn  Past Medical History:  Diagnosis Date   ADHD    Allergy    Anxiety    Depression    reports ADHD    Eczema    Raynaud's syndrome    Tremor of both hands     Past Surgical History:  Procedure Laterality Date   NO PAST SURGERIES     WISDOM TOOTH EXTRACTION      Current Medications: No outpatient medications have been marked as taking for the 04/17/21 encounter (Appointment) with Maisie Fus, MD.     Allergies:   Other, Peanut-containing drug products, Acyclovir and related, and Augmentin [amoxicillin-pot clavulanate]   Social History   Socioeconomic History   Marital status: Single    Spouse name: Not on file   Number of children: 0   Years of  education: Not on file   Highest education level: Some college, no degree  Occupational History   Not on file  Tobacco Use   Smoking status: Never   Smokeless tobacco: Never  Vaping Use   Vaping Use: Never used  Substance and Sexual Activity   Alcohol use: Never   Drug use: Never   Sexual activity: Yes    Birth control/protection: Condom, Pill  Other Topics Concern   Not on file  Social History Narrative   Lives at home with roommate   Caffeine: approx. 25-50 mg some days       ** Merged History Encounter **       Social Determinants of Health   Financial Resource Strain: Not on file  Food Insecurity: Not on file  Transportation Needs: Not on file  Physical Activity: Not on file  Stress: Not on file  Social Connections: Not on file     Family History: The patient's family history includes Alcohol abuse in her maternal grandfather and maternal grandmother; Allergic rhinitis in her father, mother, and paternal aunt; Angina in her maternal grandmother; Arthritis in her mother; Depression in her maternal grandmother and mother; Luiz Blare' disease in her mother; Hypothyroidism in her maternal grandmother; Neuropathy in  her father; Other in her paternal grandfather. There is no history of Angioedema, Asthma, Eczema, Immunodeficiency, or Urticaria.  ROS:   Please see the history of present illness.     All other systems reviewed and are negative.  EKGs/Labs/Other Studies Reviewed:    The following studies were reviewed today:   EKG:  EKG is  ordered today.  The ekg ordered today demonstrates  NSR Qtc  Recent Labs: No results found for requested labs within last 8760 hours.  Recent Lipid Panel    Component Value Date/Time   LDLDIRECT 74 05/27/2019 1344     Risk Assessment/Calculations:           Physical Exam:    VS:  There were no vitals taken for this visit.    Wt Readings from Last 3 Encounters:  12/29/20 155 lb (70.3 kg)  09/21/20 153 lb 9.6 oz (69.7  kg)  02/25/20 144 lb (65.3 kg)     GEN:  Well nourished, well developed in no acute distress HEENT: Normal NECK: No JVD;  CARDIAC: RRR, no murmurs, rubs, gallops RESPIRATORY:  Clear to auscultation without rales, wheezing or rhonchi  ABDOMEN: Soft, non-tender, non-distended MUSCULOSKELETAL:  No edema; No deformity  SKIN: Warm and dry NEUROLOGIC:  Alert and oriented x 3 PSYCHIATRIC:  Normal affect   ASSESSMENT:    # Palpitations: She had tachycardia with 3 minutes of standing c/f possible POTS however heart rate < 30 bpm increase. Not orthostatic. She does not have high risk features including syncope c/f arrhythmia , family hx of SCD, or abnormalities on her EKG.  Adderal can increase risk of tachycardia. She notes that she is very hesitant to go off the adderal. Will get an event monitor to assess rhythm and start very low dose propanolol.   PLAN:    In order of problems listed above:  Cardiac event monitor 7 days Start Proponalol 10 mg daily starting after the event monitor Follow up 3 months   Medication Adjustments/Labs and Tests Ordered: Current medicines are reviewed at length with the patient today.  Concerns regarding medicines are outlined above.    Signed, Maisie Fus, MD  04/16/2021 8:56 PM    [facility]

## 2021-04-17 ENCOUNTER — Ambulatory Visit (INDEPENDENT_AMBULATORY_CARE_PROVIDER_SITE_OTHER): Payer: BC Managed Care – PPO | Admitting: Internal Medicine

## 2021-04-17 ENCOUNTER — Ambulatory Visit (INDEPENDENT_AMBULATORY_CARE_PROVIDER_SITE_OTHER): Payer: BC Managed Care – PPO

## 2021-04-17 ENCOUNTER — Encounter: Payer: Self-pay | Admitting: Internal Medicine

## 2021-04-17 ENCOUNTER — Other Ambulatory Visit: Payer: Self-pay

## 2021-04-17 VITALS — BP 114/67 | HR 88 | Ht 69.0 in | Wt 154.2 lb

## 2021-04-17 DIAGNOSIS — R002 Palpitations: Secondary | ICD-10-CM

## 2021-04-17 MED ORDER — PROPRANOLOL HCL 10 MG PO TABS: 10.0000 mg | ORAL_TABLET | Freq: Every morning | ORAL | 3 refills | Status: DC

## 2021-04-17 NOTE — Progress Notes (Unsigned)
Patient enrolled for [employer] to mail a 7 day ZIO XT monitor to her address on file. 

## 2021-04-17 NOTE — Patient Instructions (Signed)
Medication Instructions:  START PROPRANOLOL 10 MG EVERY MORNING (AFTER YOU HAVE COMPLETED THE EVENT MONITOR) *If you need a refill on your cardiac medications before your next appointment, please call your pharmacy*   Testing/Procedures: ZIO XT- Long Term Monitor Instructions  Your physician has requested you wear a ZIO patch monitor for 7 days.  This is a single patch monitor. [employer] supplies one patch monitor per enrollment. Additional stickers are not available. Please do not apply patch if you will be having a Nuclear Stress Test,  Echocardiogram, Cardiac CT, MRI, or Chest Xray during the period you would be wearing the  monitor. The patch cannot be worn during these tests. You cannot remove and re-apply the  ZIO XT patch monitor.  Your ZIO patch monitor will be mailed 3 day USPS to your address on file. It may take 3-5 days  to receive your monitor after you have been enrolled.  Once you have received your monitor, please review the enclosed instructions. Your monitor  has already been registered assigning a specific monitor serial # to you.  Billing and Patient Assistance Program Information  We have supplied [employer] with any of your insurance information on file for billing purposes. [employer] offers a sliding scale Patient Assistance Program for patients that do not have  insurance, or whose insurance does not completely cover the cost of the ZIO monitor.  You must apply for the Patient Assistance Program to qualify for this discounted rate.  To apply, please call [employer] at [phone], select option 4, select option 2, ask to apply for  Patient Assistance Program. Meredeth Ide will ask your household income, and how many people  are in your household. They will quote your out-of-pocket cost based on that information.  [employer] will also be able to set up a 36-month, interest-free payment plan if needed.  Applying the monitor   Shave hair from upper left chest.  Hold abrader disc  by orange tab. Rub abrader in 40 strokes over the upper left chest as  indicated in your monitor instructions.  Clean area with 4 enclosed alcohol pads. Let dry.  Apply patch as indicated in monitor instructions. Patch will be placed under collarbone on left  side of chest with arrow pointing upward.  Rub patch adhesive wings for 2 minutes. Remove white label marked "1". Remove the white  label marked "2". Rub patch adhesive wings for 2 additional minutes.  While looking in a mirror, press and release button in center of patch. A small green light will  flash 3-4 times. This will be your only indicator that the monitor has been turned on.  Do not shower for the first 24 hours. You may shower after the first 24 hours.  Press the button if you feel a symptom. You will hear a small click. Record Date, Time and  Symptom in the Patient Logbook.  When you are ready to remove the patch, follow instructions on the last 2 pages of Patient  Logbook. Stick patch monitor onto the last page of Patient Logbook.  Place Patient Logbook in the blue and white box. Use locking tab on box and tape box closed  securely. The blue and white box has prepaid postage on it. Please place it in the mailbox as  soon as possible. Your physician should have your test results approximately 7 days after the  monitor has been mailed back to [facility].  Call [facility] Customer Care at [phone] if you have questions regarding  your ZIO XT  patch monitor. Call them immediately if you see an orange light blinking on your  monitor.  If your monitor falls off in less than 4 days, contact our Monitor department at [phone].  If your monitor becomes loose or falls off after 4 days call [employer] at [phone] for  suggestions on securing your monitor    Follow-Up: At [facility], you and your health needs are our priority.  As part of our continuing mission to provide you with exceptional heart care, we  have created designated Provider Care Teams.  These Care Teams include your primary Cardiologist (physician) and Advanced Practice Providers (APPs -  Physician Assistants and Nurse Practitioners) who all work together to provide you with the care you need, when you need it.  We recommend signing up for the patient portal called "MyChart".  Sign up information is provided on this After Visit Summary.  MyChart is used to connect with patients for Virtual Visits (Telemedicine).  Patients are able to view lab/test results, encounter notes, upcoming appointments, etc.  Non-urgent messages can be sent to your provider as well.   To learn more about what you can do with MyChart, go to [email or web address].    Your next appointment:   3 month(s)  The format for your next appointment:   In Person  Provider:   DR. Wyline Mood

## 2021-04-18 ENCOUNTER — Ambulatory Visit: Payer: BC Managed Care – PPO | Attending: Family Medicine | Admitting: Physical Therapy

## 2021-04-18 ENCOUNTER — Encounter: Payer: Self-pay | Admitting: Physical Therapy

## 2021-04-18 ENCOUNTER — Ambulatory Visit (INDEPENDENT_AMBULATORY_CARE_PROVIDER_SITE_OTHER): Payer: BC Managed Care – PPO | Admitting: *Deleted

## 2021-04-18 DIAGNOSIS — M2550 Pain in unspecified joint: Secondary | ICD-10-CM | POA: Insufficient documentation

## 2021-04-18 DIAGNOSIS — J309 Allergic rhinitis, unspecified: Secondary | ICD-10-CM

## 2021-04-18 DIAGNOSIS — M6281 Muscle weakness (generalized): Secondary | ICD-10-CM | POA: Diagnosis present

## 2021-04-18 DIAGNOSIS — G8929 Other chronic pain: Secondary | ICD-10-CM | POA: Diagnosis present

## 2021-04-18 DIAGNOSIS — M545 Low back pain, unspecified: Secondary | ICD-10-CM | POA: Diagnosis present

## 2021-04-18 NOTE — Therapy (Signed)
[facility] Outpatient Rehabilitation [facility] [address] [place], [place], [address] Phone: [phone]   Fax:  [phone]  Physical Therapy Treatment  Patient Details  Name: Cassandra Schultz MRN: [id number] Date of Birth: October 12, 2000 Referring Provider (PT): Horton Marshall, [place]   Encounter Date: 04/18/2021   PT End of Session - 04/18/21 1108     Visit Number 6    Number of Visits 10    Date for PT Re-Evaluation 05/16/21    Authorization Type BCBS    PT Start Time 1106    PT Stop Time 1145    PT Time Calculation (min) 39 min    Activity Tolerance Patient tolerated treatment well    Behavior During Therapy WFL for tasks assessed/performed             Past Medical History:  Diagnosis Date   ADHD    Allergy    Anxiety    Depression    reports ADHD    Eczema    Raynaud's syndrome    Tremor of both hands     Past Surgical History:  Procedure Laterality Date   NO PAST SURGERIES     WISDOM TOOTH EXTRACTION      There were no vitals filed for this visit.   Subjective Assessment - 04/18/21 1122     Subjective I just woke up.  Tired.  Pt would like to cont PT as she is still limited in her ability to stand for even 1 hour.    Currently in Pain? No/denies             Skilled therapy interventions:   Therapeutic Exercise:  Elliptical 5 min level 1 ramp and 1 resistance Standing wall squats x 10  Mini squat hold with alternating heel raise x 10   Heel tap x 10  LAQ 10 lbs cuff x 15 with ball squeeze   Sidelying hip adduction x 15   Hip abduction x 15   Diamond Clam x 15 (feet elevated)   Self care:  Goal check, symptoms and updates  LLE felt weaker with LAQ and SL hip exercises.     PT Long Term Goals - 04/18/21 1109       PT LONG TERM GOAL #1   Title She will be able to demo final HEP with good technique including modifications for pain in various joints.    Baseline does every other day    Status On-going      PT LONG TERM  GOAL #2   Title Pt will be able to stand 1 hours with improved performance and reduced pain to moderate (not severe)    Baseline 30-45 min , fatigue and pain limits (knees)  able to sit as needed.    Status Revised      PT LONG TERM GOAL #3   Title Pt will be able to show normal pain free AROM in trunk, lumbar spine    Status Achieved      PT LONG TERM GOAL #4   Title Pt will be able to hold plank position for 30 sec with no pain in back, good form to demo increased core strength    Status On-going      PT LONG TERM GOAL #5   Title pt will require no change in routine the day following a day of exercising < 30 min .    Baseline needed a 2 hour nap last time    Status On-going  Plan - 04/18/21 1115     Clinical Impression Statement Patient has completed 6 sessions with min overall improvment.  She has poor endurance and tolerance for standing, work activites.  She is aware of what she needs to do to protect her joints but she has such fatigue at work in standing it prevents her from standing therefore she rests on her ligaments. Focused on quad strength today.  HR with level 1 ramp and 1 resistance was 220 but quickly.    PT Treatment/Interventions Dry needling;Aquatic Therapy;ADLs/Self Care Home Management;Cryotherapy;Therapeutic exercise;Patient/family education;Manual techniques;Taping;Functional mobility training;Moist Heat;Therapeutic activities;Electrical Stimulation;Neuromuscular re-education    PT Home Exercise Plan Access Code: [email or web address]    Consulted and Agree with Plan of Care Patient             Patient will benefit from skilled therapeutic intervention in order to improve the following deficits and impairments:  Decreased endurance, Pain, Postural dysfunction, Increased fascial restricitons, Decreased strength, Decreased activity tolerance, Impaired flexibility  Visit Diagnosis: Chronic bilateral low back pain without sciatica  Generalized  joint pain  Muscle weakness (generalized)     Problem List Patient Active Problem List   Diagnosis Date Noted   Palpitations 04/17/2021   Seasonal and perennial allergic rhinitis 09/21/2020   Seasonal allergic conjunctivitis 09/21/2020   Intrinsic atopic dermatitis 09/21/2020   Numbness and tingling of both legs - comes and goes after activity and onset of pain 08/03/2019   Fatigue 05/27/2019   Low back pain 02/24/2019   Bilateral knee pain 02/24/2019   Bilateral hip pain 02/24/2019   Acute bilateral ankle pain 02/24/2019   Healthcare maintenance 12/23/2018   Generalized anxiety disorder 04/03/2018   Attention deficit hyperactivity disorder (ADHD), combined type, severe 04/03/2018   Mild recurrent major depression (HCC) 04/03/2018   Developmental coordination disorder 04/03/2018   Allergy with anaphylaxis due to food 02/02/2016   Allergic rhinitis due to pollen 02/02/2016    Tariyah Pendry, PT 04/18/2021, 11:41 AM  [facility] [address] [place], [place], [address] Phone: [phone]   Fax:  [phone]  Name: Cassandra Schultz MRN: [id number] Date of Birth: 2000-11-17  [name], PT 04/18/21 11:41 AM Phone: [phone] Fax: [phone]

## 2021-04-19 DIAGNOSIS — R002 Palpitations: Secondary | ICD-10-CM | POA: Diagnosis not present

## 2021-04-20 ENCOUNTER — Ambulatory Visit: Payer: BC Managed Care – PPO | Admitting: Physical Therapy

## 2021-04-25 ENCOUNTER — Ambulatory Visit: Payer: BC Managed Care – PPO | Admitting: Physical Therapy

## 2021-04-25 ENCOUNTER — Other Ambulatory Visit: Payer: Self-pay

## 2021-04-25 DIAGNOSIS — M6281 Muscle weakness (generalized): Secondary | ICD-10-CM

## 2021-04-25 DIAGNOSIS — M545 Low back pain, unspecified: Secondary | ICD-10-CM | POA: Diagnosis not present

## 2021-04-25 DIAGNOSIS — M2550 Pain in unspecified joint: Secondary | ICD-10-CM

## 2021-04-25 DIAGNOSIS — G8929 Other chronic pain: Secondary | ICD-10-CM

## 2021-04-25 NOTE — Therapy (Signed)
[facility] Outpatient Rehabilitation [facility] [address] [place], [place], [address] Phone: [phone]   Fax:  [phone]  Physical Therapy Treatment  Patient Details  Name: Cassandra Schultz MRN: [id number] Date of Birth: 21-Feb-2001 Referring Provider (PT): Horton Marshall, [place]   Encounter Date: 04/25/2021   PT End of Session - 04/25/21 1201     Visit Number 7    Number of Visits 10    Date for PT Re-Evaluation 05/16/21    Authorization Type BCBS    PT Start Time 1158   pt late   PT Stop Time 1230    PT Time Calculation (min) 32 min    Activity Tolerance Patient tolerated treatment well;No increased pain    Behavior During Therapy WFL for tasks assessed/performed             Past Medical History:  Diagnosis Date   ADHD    Allergy    Anxiety    Depression    reports ADHD    Eczema    Raynaud's syndrome    Tremor of both hands     Past Surgical History:  Procedure Laterality Date   NO PAST SURGERIES     WISDOM TOOTH EXTRACTION      There were no vitals filed for this visit.   Subjective Assessment - 04/25/21 1158     Subjective She has been wearing a HR monitor for about a week.  Tired.  My legs are sore.  I have not been that good about my HEP but I did them yesterday.    Currently in Pain? No/denies             [facility] Adult PT Treatment/Exercise:   Therapeutic Exercise: Elliptical 5 min Level 5 ramp, level 1 resistance  Omega gym -Knee extension, 10 lbs x 8 , stopped due to knee pain R  - Row narrow grip x 12 , 4 plates  Wide  x 10 , 3 plates  -Leg press 45 lbs x 15 , cues for alignment Standing red loop goal post to overhead with SLS x 10 each side  Mat table (high) plank with red looped row x 10 each side needed quick breaks x 3  Roll up/down with red loop band x 3 , unable to do this effectively.  Modified to blue band partial ROM x 8 overhead x 10 , demo needed  Isometric near full bridge with partial range horizontal pull  red looped band    Manual Therapy:   Neuromuscular re-ed: N/A   Therapeutic Activity: N/A   Modalities: N/A   Self Care: N/A   Consider / progression for next session:                      PT Long Term Goals - 04/18/21 1109       PT LONG TERM GOAL #1   Title She will be able to demo final HEP with good technique including modifications for pain in various joints.    Baseline does every other day    Status On-going      PT LONG TERM GOAL #2   Title Pt will be able to stand 1 hours with improved performance and reduced pain to moderate (not severe)    Baseline 30-45 min , fatigue and pain limits (knees)  able to sit as needed.    Status Revised      PT LONG TERM GOAL #3   Title Pt will be able to  show normal pain free AROM in trunk, lumbar spine    Status Achieved      PT LONG TERM GOAL #4   Title Pt will be able to hold plank position for 30 sec with no pain in back, good form to demo increased core strength    Status On-going      PT LONG TERM GOAL #5   Title pt will require no change in routine the day following a day of exercising < 30 min .    Baseline needed a 2 hour nap last time    Status On-going                   Plan - 04/25/21 1201     Clinical Impression Statement Pt reports she is making efforts to do her HEP.  She missed 1 appt due to forgetting.  She will get her HR monitor off tomorrow. She feels her HEP is adequately challenging.  Pain in Rt ant. knee with 10 lbs knee extension machine. Therapy benefitting patient to increase accountability and    PT Treatment/Interventions Dry needling;Aquatic Therapy;ADLs/Self Care Home Management;Cryotherapy;Therapeutic exercise;Patient/family education;Manual techniques;Taping;Functional mobility training;Moist Heat;Therapeutic activities;Electrical Stimulation;Neuromuscular re-education    PT Next Visit Plan try wgt machines for  upper body/lower body .  consider DN for QL/manual. core  (pilates).    PT Home Exercise Plan Access Code: [email or web address]             Patient will benefit from skilled therapeutic intervention in order to improve the following deficits and impairments:  Decreased endurance, Pain, Postural dysfunction, Increased fascial restricitons, Decreased strength, Decreased activity tolerance, Impaired flexibility  Visit Diagnosis: Chronic bilateral low back pain without sciatica  Generalized joint pain  Muscle weakness (generalized)     Problem List Patient Active Problem List   Diagnosis Date Noted   Palpitations 04/17/2021   Seasonal and perennial allergic rhinitis 09/21/2020   Seasonal allergic conjunctivitis 09/21/2020   Intrinsic atopic dermatitis 09/21/2020   Numbness and tingling of both legs - comes and goes after activity and onset of pain 08/03/2019   Fatigue 05/27/2019   Low back pain 02/24/2019   Bilateral knee pain 02/24/2019   Bilateral hip pain 02/24/2019   Acute bilateral ankle pain 02/24/2019   Healthcare maintenance 12/23/2018   Generalized anxiety disorder 04/03/2018   Attention deficit hyperactivity disorder (ADHD), combined type, severe 04/03/2018   Mild recurrent major depression (HCC) 04/03/2018   Developmental coordination disorder 04/03/2018   Allergy with anaphylaxis due to food 02/02/2016   Allergic rhinitis due to pollen 02/02/2016    Owen Pratte, PT 04/25/2021, 12:30 PM  [facility] Outpatient Rehabilitation [facility] [address] [place], [place], [address] Phone: [phone]   Fax:  [phone]  Name: Cassandra Schultz MRN: [id number] Date of Birth: 24-Sep-2000  [name], PT 04/25/21 12:30 PM Phone: [phone] Fax: [phone]

## 2021-05-02 ENCOUNTER — Ambulatory Visit: Payer: BC Managed Care – PPO | Admitting: Physical Therapy

## 2021-05-02 ENCOUNTER — Other Ambulatory Visit: Payer: Self-pay

## 2021-05-02 DIAGNOSIS — M2550 Pain in unspecified joint: Secondary | ICD-10-CM

## 2021-05-02 DIAGNOSIS — M545 Low back pain, unspecified: Secondary | ICD-10-CM | POA: Diagnosis not present

## 2021-05-02 DIAGNOSIS — M6281 Muscle weakness (generalized): Secondary | ICD-10-CM

## 2021-05-02 DIAGNOSIS — G8929 Other chronic pain: Secondary | ICD-10-CM

## 2021-05-02 NOTE — Therapy (Signed)
[facility] Outpatient Rehabilitation [facility] [address] [place], [place], [address] Phone: [phone]   Fax:  [phone]  Physical Therapy Treatment  Patient Details  Name: Cassandra Schultz MRN: [id number] Date of Birth: 2000-10-22 Referring Provider (PT): Horton Marshall, [place]   Encounter Date: 05/02/2021   PT End of Session - 05/02/21 1240     Visit Number 8    Number of Visits 10    Date for PT Re-Evaluation 05/16/21    Authorization Type BCBS    PT Start Time 1233    PT Stop Time 1315    PT Time Calculation (min) 42 min    Activity Tolerance Patient tolerated treatment well;No increased pain    Behavior During Therapy WFL for tasks assessed/performed             Past Medical History:  Diagnosis Date   ADHD    Allergy    Anxiety    Depression    reports ADHD    Eczema    Raynaud's syndrome    Tremor of both hands     Past Surgical History:  Procedure Laterality Date   NO PAST SURGERIES     WISDOM TOOTH EXTRACTION      There were no vitals filed for this visit.   Subjective Assessment - 05/02/21 1236     Subjective I'm on a beta blocker and its helping me not be so tired.  No pain . I havent done my exercises much this week. Did some at work.  Im going to journal about it when I dont do the exercises that way I can see if it is mental or physical.    Patient is accompained by: Family member    Currently in Pain? No/denies                [facility] Adult PT Treatment/Exercise:   Therapeutic Exercise: Elliptical 5 min L 10 ramp, L1 resistance EMOM 20 lbs squat x 10 , HR 150 bpm  Plank variations: on high mat table  Mountain climbers x 10  Hip extension x 10  Lateral to taps x 10 HR 162 bpm   -Tall kneeling hinge/modified bridge : 10 Lbs wgt x 10 Dead lift 10 lbs instead due to knee pain for 2nd set then added another 10 lbs for 3rd set     Row 10 lbs x 10 (modified quadruped) each side   Plank push up with blue band assist  x 5 , unable  Lateral band walks blue band 3 x 30 feet HR 168 after this     PT Long Term Goals - 04/18/21 1109       PT LONG TERM GOAL #1   Title She will be able to demo final HEP with good technique including modifications for pain in various joints.    Baseline does every other day    Status On-going      PT LONG TERM GOAL #2   Title Pt will be able to stand 1 hours with improved performance and reduced pain to moderate (not severe)    Baseline 30-45 min , fatigue and pain limits (knees)  able to sit as needed.    Status Revised      PT LONG TERM GOAL #3   Title Pt will be able to show normal pain free AROM in trunk, lumbar spine    Status Achieved      PT LONG TERM GOAL #4   Title Pt will be able to  hold plank position for 30 sec with no pain in back, good form to demo increased core strength    Status On-going      PT LONG TERM GOAL #5   Title pt will require no change in routine the day following a day of exercising < 30 min .    Baseline needed a 2 hour nap last time    Status On-going                   Plan - 05/02/21 1240     Clinical Impression Statement Patient shows excellent form with exercise, HR response to 160's with weight training intervals.  She Continues to work toward consistency and habit building for long term success.  New med is helping her feel less exhausted.  Does not know the results of her recent week -long HR monitor.    PT Treatment/Interventions Dry needling;Aquatic Therapy;ADLs/Self Care Home Management;Cryotherapy;Therapeutic exercise;Patient/family education;Manual techniques;Taping;Functional mobility training;Moist Heat;Therapeutic activities;Electrical Stimulation;Neuromuscular re-education    PT Next Visit Plan prep for DC?    PT Home Exercise Plan Access Code: [email or web address]    Consulted and Agree with Plan of Care Patient             Patient will benefit from skilled therapeutic intervention in order to improve the following  deficits and impairments:  Decreased endurance, Pain, Postural dysfunction, Increased fascial restricitons, Decreased strength, Decreased activity tolerance, Impaired flexibility  Visit Diagnosis: Chronic bilateral low back pain without sciatica  Generalized joint pain  Muscle weakness (generalized)     Problem List Patient Active Problem List   Diagnosis Date Noted   Palpitations 04/17/2021   Seasonal and perennial allergic rhinitis 09/21/2020   Seasonal allergic conjunctivitis 09/21/2020   Intrinsic atopic dermatitis 09/21/2020   Numbness and tingling of both legs - comes and goes after activity and onset of pain 08/03/2019   Fatigue 05/27/2019   Low back pain 02/24/2019   Bilateral knee pain 02/24/2019   Bilateral hip pain 02/24/2019   Acute bilateral ankle pain 02/24/2019   Healthcare maintenance 12/23/2018   Generalized anxiety disorder 04/03/2018   Attention deficit hyperactivity disorder (ADHD), combined type, severe 04/03/2018   Mild recurrent major depression (HCC) 04/03/2018   Developmental coordination disorder 04/03/2018   Allergy with anaphylaxis due to food 02/02/2016   Allergic rhinitis due to pollen 02/02/2016    Cassandra Schultz, PT 05/02/2021, 1:47 PM  [facility] [address] [place], [place], [address] Phone: [phone]   Fax:  [phone]  Name: Cassandra Schultz MRN: [id number] Date of Birth: 2001-02-26   [name], PT 05/02/21 1:47 PM Phone: [phone] Fax: [phone]

## 2021-05-09 ENCOUNTER — Other Ambulatory Visit: Payer: Self-pay

## 2021-05-09 ENCOUNTER — Encounter: Payer: Self-pay | Admitting: Physical Therapy

## 2021-05-09 ENCOUNTER — Ambulatory Visit: Payer: BC Managed Care – PPO | Admitting: Physical Therapy

## 2021-05-09 ENCOUNTER — Other Ambulatory Visit: Payer: Self-pay | Admitting: Internal Medicine

## 2021-05-09 DIAGNOSIS — I471 Supraventricular tachycardia: Secondary | ICD-10-CM | POA: Insufficient documentation

## 2021-05-09 DIAGNOSIS — M545 Low back pain, unspecified: Secondary | ICD-10-CM

## 2021-05-09 DIAGNOSIS — M6281 Muscle weakness (generalized): Secondary | ICD-10-CM

## 2021-05-09 DIAGNOSIS — M2550 Pain in unspecified joint: Secondary | ICD-10-CM

## 2021-05-09 MED ORDER — METOPROLOL SUCCINATE ER 25 MG PO TB24: 12.5000 mg | ORAL_TABLET | Freq: Every day | ORAL | 3 refills | Status: DC

## 2021-05-09 NOTE — Therapy (Signed)
[facility] Outpatient Rehabilitation [facility] [address] [place], [place], [address] Phone: [phone]   Fax:  [phone]  Physical Therapy Treatment  Patient Details  Name: Cassandra Schultz MRN: [id number] Date of Birth: [birth year] Referring Provider (PT): Horton Marshall, [place]   Encounter Date: 05/09/2021   PT End of Session - 05/09/21 1153     Visit Number 9    Number of Visits 10    Date for PT Re-Evaluation 05/16/21    Authorization Type BCBS    PT Start Time 1150    PT Stop Time 1230    PT Time Calculation (min) 40 min             Past Medical History:  Diagnosis Date   ADHD    Allergy    Anxiety    Depression    reports ADHD    Eczema    Raynaud's syndrome    Tremor of both hands     Past Surgical History:  Procedure Laterality Date   NO PAST SURGERIES     WISDOM TOOTH EXTRACTION      There were no vitals filed for this visit.   Subjective Assessment - 05/09/21 1152     Subjective Pt went to [place] over the weekend.  She was supposed to be off from exercises.  It was too strenuous. Im just kinda tired today. No pain.  Not yet.    Currently in Pain? No/denies              [facility] Adult PT Treatment/Exercise:   Therapeutic Exercise: UBE for 5 min L1 , did 2.5 min each direction Standing shoulder Freemotion Row 2 plates in a squat hold Extension 2 plates in tandem  Flexion 1 plate feet narrow Diagonal pull 1 plate 2 x 10 each UE  Supine horizontal and diagonal pull x 30 sec  ER x 30 sec green looped band  Dead bug start arms overhead x 10   Added 1 leg knee ext. X 10 each side   Self Care: Prep for DC    Consider / progression for next session:         PT Long Term Goals - 04/18/21 1109       PT LONG TERM GOAL #1   Title She will be able to demo final HEP with good technique including modifications for pain in various joints.    Baseline does every other day    Status On-going      PT LONG TERM GOAL  #2   Title Pt will be able to stand 1 hours with improved performance and reduced pain to moderate (not severe)    Baseline 30-45 min , fatigue and pain limits (knees)  able to sit as needed.    Status Revised      PT LONG TERM GOAL #3   Title Pt will be able to show normal pain free AROM in trunk, lumbar spine    Status Achieved      PT LONG TERM GOAL #4   Title Pt will be able to hold plank position for 30 sec with no pain in back, good form to demo increased core strength    Status On-going      PT LONG TERM GOAL #5   Title pt will require no change in routine the day following a day of exercising < 30 min .    Baseline needed a 2 hour nap last time    Status On-going  Plan - 05/09/21 1228     Clinical Impression Statement Pt is showing improved independence and consistency with her HEP.  She is on the waiting list for [facility] clinic in [place], [place]/  Not sure she has POTS. She shows good form and body awarenes. Weakness in upper body ,needed modifed resistance than I expected.  Min shoulder and elbow pain with weights.    PT Treatment/Interventions Dry needling;Aquatic Therapy;ADLs/Self Care Home Management;Cryotherapy;Therapeutic exercise;Patient/family education;Manual techniques;Taping;Functional mobility training;Moist Heat;Therapeutic activities;Electrical Stimulation;Neuromuscular re-education    PT Next Visit Plan DC    PT Home Exercise Plan Access Code: [email or web address]    Consulted and Agree with Plan of Care Patient             Patient will benefit from skilled therapeutic intervention in order to improve the following deficits and impairments:  Decreased endurance, Pain, Postural dysfunction, Increased fascial restricitons, Decreased strength, Decreased activity tolerance, Impaired flexibility  Visit Diagnosis: Chronic bilateral low back pain without sciatica  Generalized joint pain  Muscle weakness (generalized)     Problem List Patient  Active Problem List   Diagnosis Date Noted   Palpitations 04/17/2021   Seasonal and perennial allergic rhinitis 09/21/2020   Seasonal allergic conjunctivitis 09/21/2020   Intrinsic atopic dermatitis 09/21/2020   Numbness and tingling of both legs - comes and goes after activity and onset of pain 08/03/2019   Fatigue 05/27/2019   Low back pain 02/24/2019   Bilateral knee pain 02/24/2019   Bilateral hip pain 02/24/2019   Acute bilateral ankle pain 02/24/2019   Healthcare maintenance 12/23/2018   Generalized anxiety disorder 04/03/2018   Attention deficit hyperactivity disorder (ADHD), combined type, severe 04/03/2018   Mild recurrent major depression (HCC) 04/03/2018   Developmental coordination disorder 04/03/2018   Allergy with anaphylaxis due to food 02/02/2016   Allergic rhinitis due to pollen 02/02/2016    Ovie Eastep, PT 05/09/2021, 12:31 PM  [facility] Outpatient Rehabilitation [facility] [address] [place], [place], [address] Phone: [phone]   Fax:  [phone]  Name: Cassandra Schultz MRN: [id number] Date of Birth: [birth year]  [name], PT 05/09/21 12:31 PM Phone: [phone] Fax: [phone]

## 2021-05-10 ENCOUNTER — Telehealth: Payer: Self-pay | Admitting: Internal Medicine

## 2021-05-10 ENCOUNTER — Other Ambulatory Visit: Payer: Self-pay | Admitting: Internal Medicine

## 2021-05-10 DIAGNOSIS — R000 Tachycardia, unspecified: Secondary | ICD-10-CM

## 2021-05-10 MED ORDER — PROPRANOLOL HCL 10 MG PO TABS: 10.0000 mg | ORAL_TABLET | Freq: Three times a day (TID) | ORAL | 3 refills | Status: DC

## 2021-05-10 NOTE — Telephone Encounter (Signed)
Maisie Fus, MD  05/09/2021  6:50 PM EST     Hi Eileen Stanford, please let Cassandra Schultz know that she had high heart rates with runs of supraventricular tachycardia. This is not dangerous. I started a beta blocker for her to help with the fast heart rates.  I started metop XL 12.5 mg daily. If her systolic blood pressure remains above 90 mmHg, she can take the full tablet. I will stop her propanolol.   Spoke with patient about results. She wants to discuss med change with MD first, as she said propranolol is working well for her. She has been scheduled for 05/23/21 @ 1:20pm to review with Dr. Wyline Mood

## 2021-05-10 NOTE — Telephone Encounter (Signed)
Patient return for test results.

## 2021-05-10 NOTE — Telephone Encounter (Signed)
Maisie Fus, MD  You 7 minutes ago (12:24 PM)   MB I called her back and left a vm. I changed to her propanolol dose ot 10 mg TID a recommended dose for inappropriate sinus tachycardia/SVT. She is on adderal which contributes to her high heart rates.

## 2021-05-16 ENCOUNTER — Ambulatory Visit: Payer: BC Managed Care – PPO | Admitting: Physical Therapy

## 2021-05-17 ENCOUNTER — Ambulatory Visit (INDEPENDENT_AMBULATORY_CARE_PROVIDER_SITE_OTHER): Payer: BC Managed Care – PPO

## 2021-05-17 ENCOUNTER — Encounter: Payer: Self-pay | Admitting: Physical Therapy

## 2021-05-17 ENCOUNTER — Ambulatory Visit: Payer: BC Managed Care – PPO | Admitting: Physical Therapy

## 2021-05-17 ENCOUNTER — Other Ambulatory Visit: Payer: Self-pay

## 2021-05-17 DIAGNOSIS — J309 Allergic rhinitis, unspecified: Secondary | ICD-10-CM | POA: Diagnosis not present

## 2021-05-17 DIAGNOSIS — M2550 Pain in unspecified joint: Secondary | ICD-10-CM

## 2021-05-17 DIAGNOSIS — M545 Low back pain, unspecified: Secondary | ICD-10-CM | POA: Diagnosis not present

## 2021-05-17 DIAGNOSIS — M6281 Muscle weakness (generalized): Secondary | ICD-10-CM

## 2021-05-17 NOTE — Therapy (Addendum)
[place], [place], [address] Phone: [phone]   Fax:  [phone]  Physical Therapy Treatment/Discharge   Patient Details  Name: Cassandra Schultz MRN: [id number] Date of Birth: [birth year] Referring Provider (PT): Marilynne Drivers, [place]   Encounter Date: 05/17/2021   PT End of Session - 05/17/21 1247     Visit Number 10    Number of Visits 10    Date for PT Re-Evaluation 05/16/21    Authorization Type BCBS    PT Start Time 1245   15 minutes   PT Stop Time 2197    PT Time Calculation (min) 30 min             Past Medical History:  Diagnosis Date   ADHD    Allergy    Anxiety    Depression    reports ADHD    Eczema    Raynaud's syndrome    Tremor of both hands     Past Surgical History:  Procedure Laterality Date   NO PAST SURGERIES     WISDOM TOOTH EXTRACTION      There were no vitals filed for this visit.   Subjective Assessment - 05/17/21 1245     Subjective Just woke up and my body is stiff.    Currently in Pain? No/denies             [facility] Adult PT Treatment/Exercise:  Therapeutic Exercise: - rec Bike L2 x 5 min -plank on forearms 30 seconds  -Side plank elbow and knees 30 sec each  -dead lift with 10# (2 -5# KB) x 10 x 2  -supine tricep extension 5# each x 10 -bridge from bolster with ball squeze x 10 -side clam with feet elevated x 10 each       PT Long Term Goals - 05/17/21 1247       PT LONG TERM GOAL #1   Title She will be able to demo final HEP with good technique including modifications for pain in various joints.    Baseline does every other day- sometimes can go 3-4 days without doing them due to schedule    Time 5    Period Weeks    Status Achieved      PT LONG TERM GOAL #2   Title Pt will be able to stand 1 hours with improved performance and reduced pain to moderate (not severe)    Baseline 30-45 min , fatigue and pain limits (knees)  able to sit as  needed; 05/17/21- can stand about an hour; can sit about an hour    Time 5    Period Weeks    Status Achieved      PT LONG TERM GOAL #3   Title Pt will be able to show normal pain free AROM in trunk, lumbar spine    Baseline to ankles, no pain, HS stretch    Time 5    Period Weeks    Status Achieved      PT LONG TERM GOAL #4   Title Pt will be able to hold plank position for 30 sec with no pain in back, good form to demo increased core strength    Baseline Forearm plank and side planks 30 sec each    Time 5    Period Weeks    Status Achieved      PT LONG TERM GOAL #5   Title pt will require no change in routine the day following a day  of exercising < 30 min .    Baseline not doing full workouts so not needing naps    Time 5    Period Weeks    Status Partially Met                   Plan - 05/17/21 1251     Clinical Impression Statement Pt reports journaling has helped to determine that she needs to do her exercises in the morning or she will not complete them at all. She will go 4-5 days sometimes without completing HEP however from a bigger picture she has been fairly consistent. She reports improved standing and sitting tolerance to 1 hour. She is also able to plank for 30 sec fron and side positions. She has met most of her LTGs and is agreeable to discharge to HEP today.    PT Treatment/Interventions Dry needling;Aquatic Therapy;ADLs/Self Care Home Management;Cryotherapy;Therapeutic exercise;Patient/family education;Manual techniques;Taping;Functional mobility training;Moist Heat;Therapeutic activities;Electrical Stimulation;Neuromuscular re-education    PT Next Visit Plan discharge today    PT Home Exercise Plan Access Code: [email or web address]    [email or web address] and Agree with Plan of Care Patient             Patient will benefit from skilled therapeutic intervention in order to improve the following deficits and impairments:  Decreased endurance, Pain, Postural dysfunction,  Increased fascial restricitons, Decreased strength, Decreased activity tolerance, Impaired flexibility  Visit Diagnosis: Chronic bilateral low back pain without sciatica  Generalized joint pain  Muscle weakness (generalized)     Problem List Patient Active Problem List   Diagnosis Date Noted   SVT (supraventricular tachycardia) (HCC) 05/09/2021   Palpitations 04/17/2021   Seasonal and perennial allergic rhinitis 09/21/2020   Seasonal allergic conjunctivitis 09/21/2020   Intrinsic atopic dermatitis 09/21/2020   Numbness and tingling of both legs - comes and goes after activity and onset of pain 08/03/2019   Fatigue 05/27/2019   Low back pain 02/24/2019   Bilateral knee pain 02/24/2019   Bilateral hip pain 02/24/2019   Acute bilateral ankle pain 02/24/2019   Healthcare maintenance 12/23/2018   Generalized anxiety disorder 04/03/2018   Attention deficit hyperactivity disorder (ADHD), combined type, severe 04/03/2018   Mild recurrent major depression ([place]) 04/03/2018   Developmental coordination disorder 04/03/2018   Allergy with anaphylaxis due to food 02/02/2016   Allergic rhinitis due to pollen 02/02/2016    Dorene Ar, PTA 05/17/2021, 1:34 PM  [facility] [address] [place], [place], [address] Phone: [phone]   Fax:  [phone]  Name: Cassandra Schultz MRN: [id number] Date of Birth: [birth year]   PHYSICAL THERAPY DISCHARGE SUMMARY  Visits from Start of Care: 10  Current functional level related to goals / functional outcomes: See above    Remaining deficits: Endurance and tolerance for exercise   Education / Equipment: HEP, posture, lifting    Patient agrees to discharge. Patient goals were met. Patient is being discharged due to meeting the stated rehab goals.  Raeford Razor, PT 05/18/21 7:53 AM Phone: [phone] Fax: [phone]

## 2021-05-18 NOTE — Addendum Note (Signed)
Addended by: [name] L on: 05/18/2021 07:55 AM   Modules accepted: Orders

## 2021-05-22 NOTE — Progress Notes (Signed)
Cardiology Office Note:    Date:  05/22/2021   ID:  Cassandra Schultz, DOB January 06, 2001, MRN [id number]  PCP:  Cassandra Lund, PA   [facility] Providers Cardiologist:  None     Referring MD: Cassandra Lund, PA   No chief complaint on file. Palpitations, tachycardia   History of Present Illness:    Cassandra Schultz is a 20 y.o. female with a hx of ADHD on adderal, who comes in with palpitations.  [name] notes at work her heart rate is in the 130s to 140s. This has been going on since highschool. She notes bouts of fast heart rates in highschool.  She has LH, dizziness. She denies syncope. If she stands from sitting  she notes the [facility] and dizzy. It happens occasionally. She drinks caffeine. She has cut back from 16 oz per day. She takes adderal she was on 30 mg, she could not eat and lost weight. It was reduced to adderal 20 mg daily. She works part time at [employer]. She has no family hx of SCD. She has no hx of congential heart disease. She thinks her father had MI in his 56s.Her half brother has whole in his heart.    Orthostatics:  Lying 100 / 63 mmHg pulse 88 Sitting 108/71 mmHg pulse 111 Stand 95/63 mmHg Stand (3 min) 10/66 mg pulse 113  TTE 04/04/2020 Normal LVEF, RV No valve dx No pulmonary htn  Interim Hx: Patient noted to have some SVT on ziopatch. She is symptomatic. Planned for metop but patient preferred propanolol/ Today, she comes in and states she is taking  propanolol. 10 mg daily. She is feeling less fast heart rates. She does not feel SOB, or tired. She is fine with this dose for now.   Cardiology Studies 04/04/2020- normal echo Zio patch 04/17/2021-05/09/2021: Avg HR 100 bpm, runs of long RP tachycardia    Past Medical History:  Diagnosis Date   ADHD    Allergy    Anxiety    Depression    reports ADHD    Eczema    Raynaud's syndrome    Tremor of both hands     Past Surgical History:  Procedure Laterality Date   NO PAST SURGERIES     WISDOM  TOOTH EXTRACTION      Current Medications: No outpatient medications have been marked as taking for the 05/23/21 encounter (Appointment) with Cassandra Fus, MD.     Allergies:   Other, Peanut-containing drug products, Acyclovir and related, and Augmentin [amoxicillin-pot clavulanate]   Social History   Socioeconomic History   Marital status: Single    Spouse name: Not on file   Number of children: 0   Years of education: Not on file   Highest education level: Some college, no degree  Occupational History   Not on file  Tobacco Use   Smoking status: Never   Smokeless tobacco: Never  Vaping Use   Vaping Use: Never used  Substance and Sexual Activity   Alcohol use: Never   Drug use: Never   Sexual activity: Yes    Birth control/protection: Condom, Pill  Other Topics Concern   Not on file  Social History Narrative   Lives at home with roommate   Caffeine: approx. 25-50 mg some days       ** Merged History Encounter **       Social Determinants of Health   Financial Resource Strain: Not on file  Food Insecurity: Not on file  Transportation  Needs: Not on file  Physical Activity: Not on file  Stress: Not on file  Social Connections: Not on file     Family History: The patient's family history includes Alcohol abuse in her maternal grandfather and maternal grandmother; Allergic rhinitis in her father, mother, and paternal aunt; Angina in her maternal grandmother; Arthritis in her mother; Depression in her maternal grandmother and mother; Luiz Blare' disease in her mother; Hypothyroidism in her maternal grandmother; Neuropathy in her father; Other in her paternal grandfather. There is no history of Angioedema, Asthma, Eczema, Immunodeficiency, or Urticaria.  ROS:   Please see the history of present illness.     All other systems reviewed and are negative.  EKGs/Labs/Other Studies Reviewed:    The following studies were reviewed today:   EKG:  EKG is  ordered today.  The  ekg ordered today demonstrates   04/17/2021-NSR Qtc  Recent Labs: No results found for requested labs within last 8760 hours.  Recent Lipid Panel    Component Value Date/Time   LDLDIRECT 74 05/27/2019 1344     Risk Assessment/Calculations:           Physical Exam:    VS:  There were no vitals taken for this visit.    Wt Readings from Last 3 Encounters:  04/17/21 154 lb 3.2 oz (69.9 kg)  12/29/20 155 lb (70.3 kg)  09/21/20 153 lb 9.6 oz (69.7 kg)     GEN:  Well nourished, well developed in no acute distress HEENT: moist mucous membranes CARDIAC: RRR, no murmurs, rubs, gallops RESPIRATORY:  Clear to auscultation without rales, wheezing or rhonchi  ABDOMEN: Soft, non-tender, non-distended MUSCULOSKELETAL:  No edema; No deformity  SKIN: Warm and dry NEUROLOGIC:  Alert and oriented x 3 PSYCHIATRIC:  Normal affect   ASSESSMENT:    #SVT/Sinus tachycardia:  Initially screened for POTS. She had tachycardia with 3 minutes of standing c/f possible POTS however heart rate < 30 bpm increase. She was not orthostatic.   She had a zio 04/17/2021 that showed average HR 100, runs of  long RP SVT. She had a normal echo. No family hx of SCD. She is on adderall which contributes to high heart rates. Will continue propanol at 10 mg daily.  PLAN:    In order of problems listed above:  Continue propanolol 10 mg daily Follow up in 1 year   Medication Adjustments/Labs and Tests Ordered: Current medicines are reviewed at length with the patient today.  Concerns regarding medicines are outlined above.    Signed, Cassandra Fus, MD  05/22/2021 3:15 PM    [facility]

## 2021-05-23 ENCOUNTER — Ambulatory Visit (INDEPENDENT_AMBULATORY_CARE_PROVIDER_SITE_OTHER): Payer: BC Managed Care – PPO | Admitting: Internal Medicine

## 2021-05-23 ENCOUNTER — Other Ambulatory Visit: Payer: Self-pay

## 2021-05-23 ENCOUNTER — Encounter: Payer: Self-pay | Admitting: Internal Medicine

## 2021-05-23 DIAGNOSIS — R000 Tachycardia, unspecified: Secondary | ICD-10-CM | POA: Diagnosis not present

## 2021-05-23 MED ORDER — PROPRANOLOL HCL 10 MG PO TABS: 10.0000 mg | ORAL_TABLET | Freq: Every day | ORAL | 3 refills | Status: DC

## 2021-05-23 NOTE — Patient Instructions (Signed)
Medication Instructions PLEASE TAKE PROPANOLOL 10mg  ONCE DAILY  *If you need a refill on your cardiac medications before your next appointment, please call your pharmacy*  Follow-Up: At [facility], you and your health needs are our priority.  As part of our continuing mission to provide you with exceptional heart care, we have created designated Provider Care Teams.  These Care Teams include your primary Cardiologist (physician) and Advanced Practice Providers (APPs -  Physician Assistants and Nurse Practitioners) who all work together to provide you with the care you need, when you need it.  We recommend signing up for the patient portal called "MyChart".  Sign up information is provided on this After Visit Summary.  MyChart is used to connect with patients for Virtual Visits (Telemedicine).  Patients are able to view lab/test results, encounter notes, upcoming appointments, etc.  Non-urgent messages can be sent to your provider as well.   To learn more about what you can do with MyChart, go to [facility].    Your next appointment:   1 year(s)  The format for your next appointment:   In Person  Provider:   [email or web address], MD

## 2021-06-07 ENCOUNTER — Ambulatory Visit (INDEPENDENT_AMBULATORY_CARE_PROVIDER_SITE_OTHER): Payer: BC Managed Care – PPO

## 2021-06-07 DIAGNOSIS — J309 Allergic rhinitis, unspecified: Secondary | ICD-10-CM | POA: Diagnosis not present

## 2021-06-14 IMAGING — MR MR LUMBAR SPINE W/O CM
4 of 5 series · 19 of 48 positions shown · non-contrast
Comparison: Lumbar spine radiographs 06/09/2019

CLINICAL DATA: Chronic low back pain. Numbness in the legs.

EXAM:
MRI LUMBAR SPINE WITHOUT CONTRAST
TECHNIQUE: Multiplanar, multisequence MR imaging of the lumbar spine was
performed. No intravenous contrast was administered.

[Series 5: T2 · sagittal · 4.0mm · 0.73mm/px · 6 of 14 slices shown (1 of 2)]
[im 1/14]
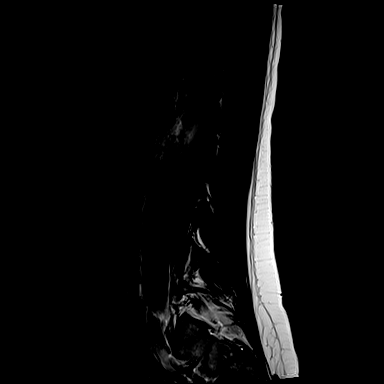
[im 3/14]
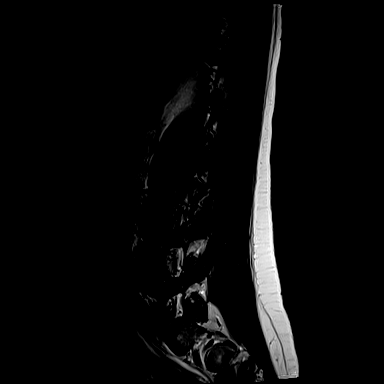
[im 6/14]
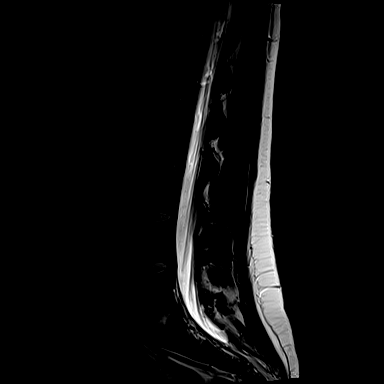
[im 8/14]
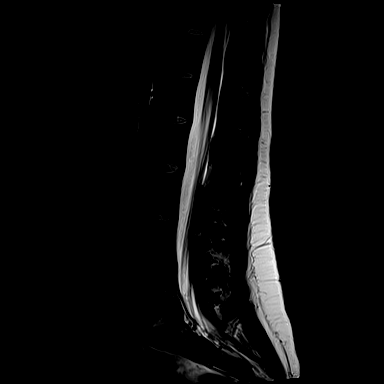
[im 11/14]
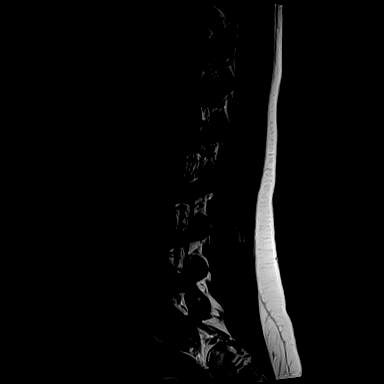
[im 14/14]
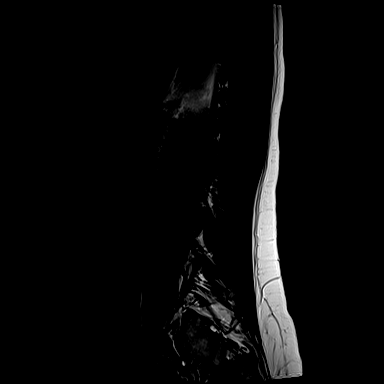

[Series 6: T1 · sagittal · 4.0mm · 0.88mm/px · 3 of 14 slices shown (1 of 2)]
[im 1/14]
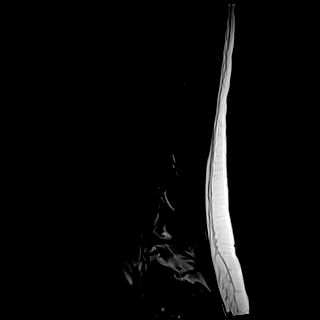
[im 7/14]
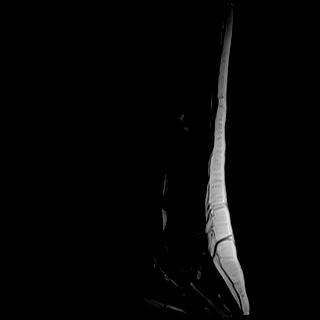
[im 14/14]
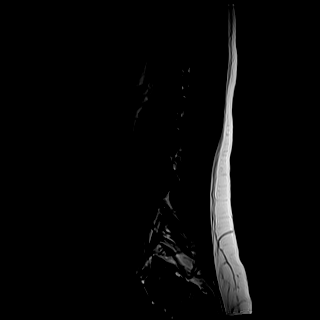

[Series 10: T1 · axial · 4.0mm · 0.28mm/px · z∈[-78,+86]mm · 3 of 41 slices shown (2 of 2)]
[im 6/41]
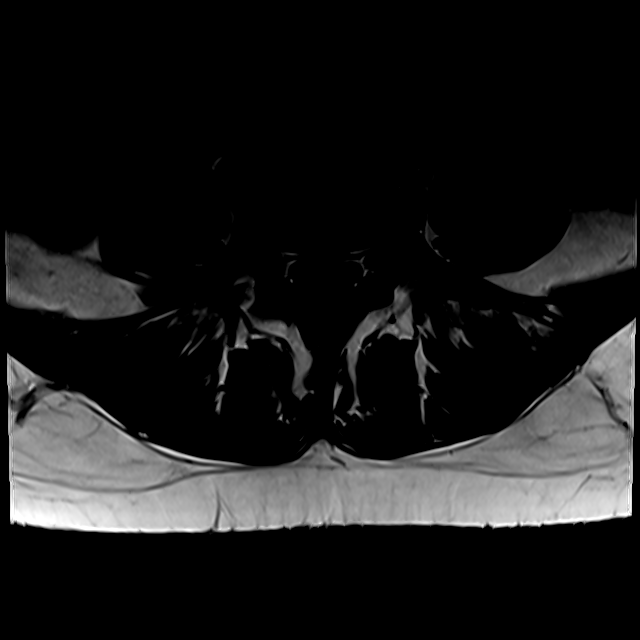
[im 22/41]
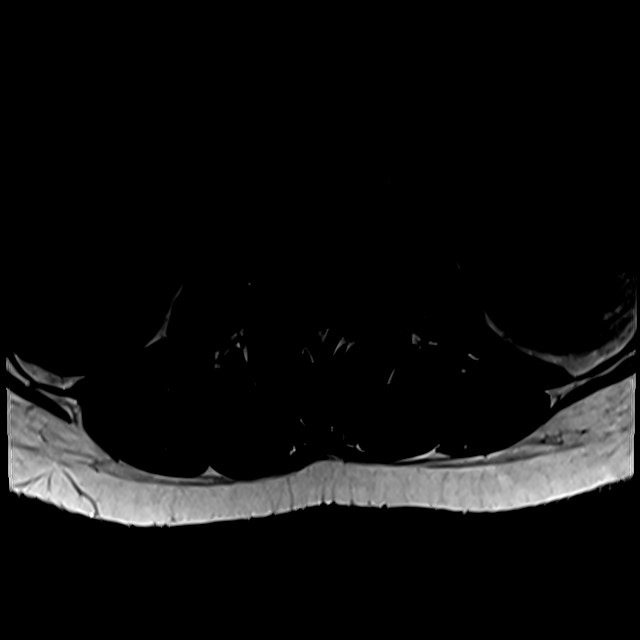
[im 35/41]
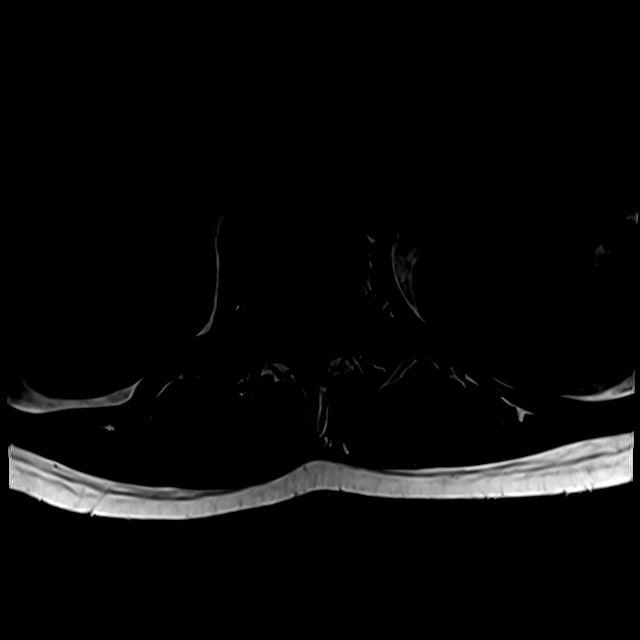

[Series 13: T2 · axial · 4.0mm · 0.28mm/px · z∈[-93,+86]mm · 7 of 41 slices shown (2 of 2)]
[im 3/41]
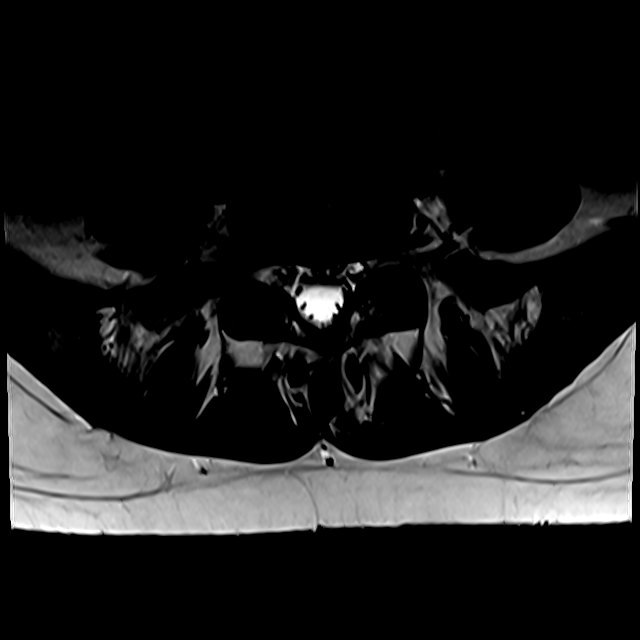
[im 6/41]
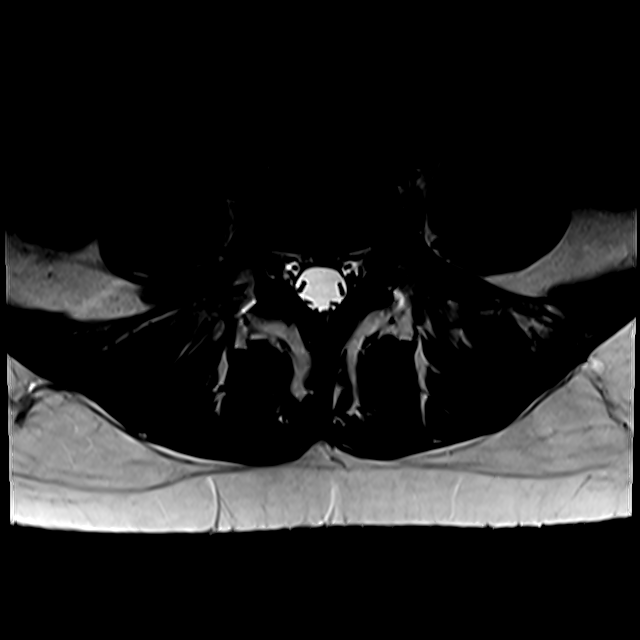
[im 9/41]
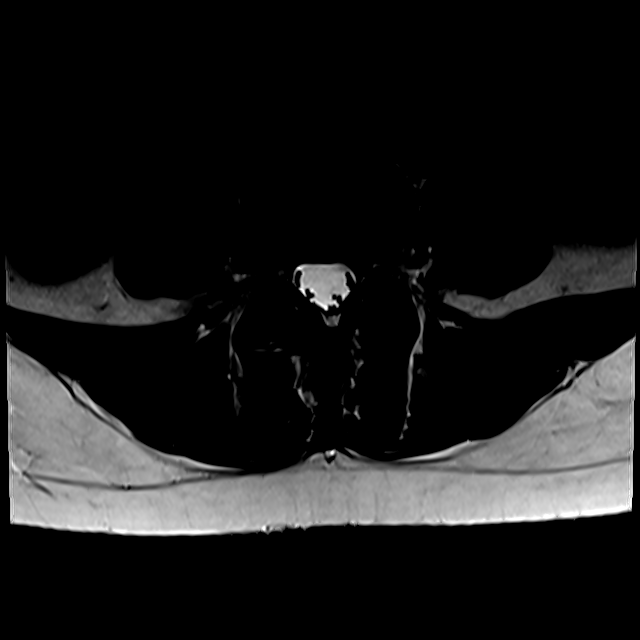
[im 14/41]
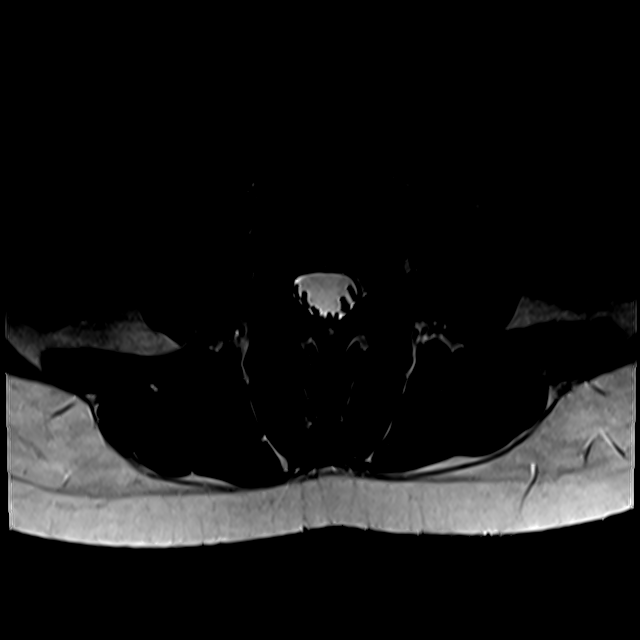
[im 19/41]
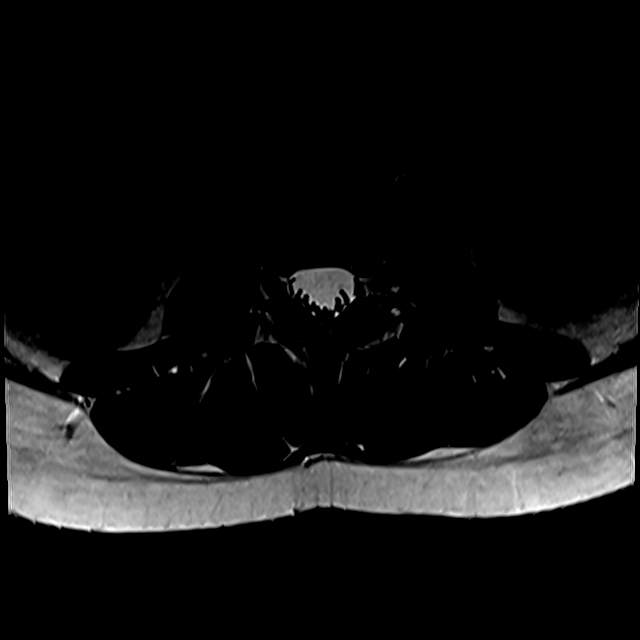
[im 22/41]
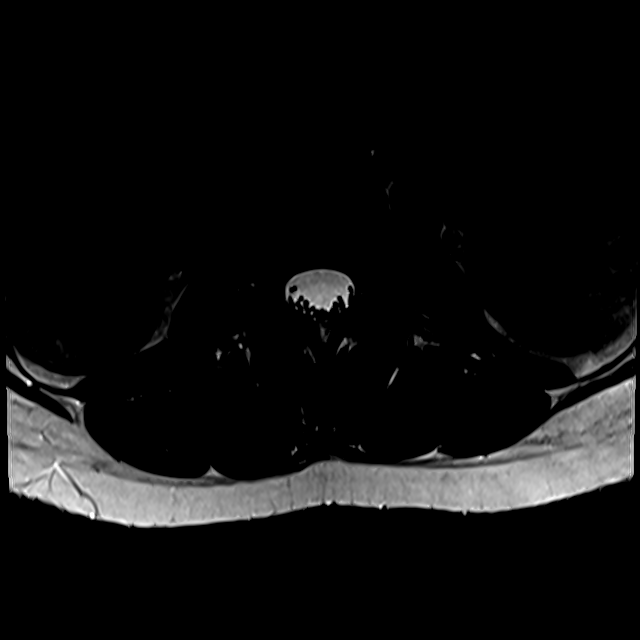
[im 35/41]
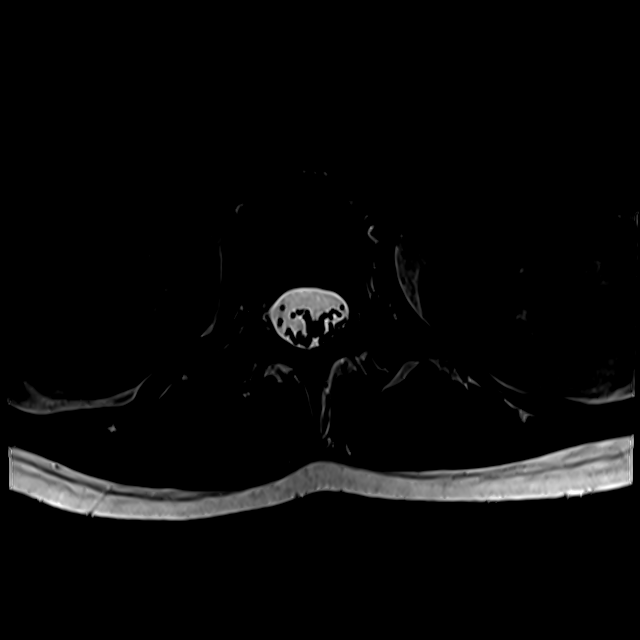

[19 of 48 positions shown; findings below may reference images not displayed]

FINDINGS: Segmentation:  Standard.

Alignment:  Mild thoracolumbar dextroscoliosis. No listhesis.

Vertebrae:  No fracture, evidence of discitis, or bone lesion.

Conus medullaris and cauda equina: Conus extends to the L1 level.
Conus and cauda equina appear normal.

Paraspinal and other soft tissues: Unremarkable.

Disc levels:

Disc height and hydration are preserved throughout the lumbar spine.
No disc herniation is identified, and the spinal canal and neural
foramina are widely patent.
IMPRESSION: Mild thoracolumbar dextroscoliosis. Otherwise unremarkable lumbar
spine MRI.

## 2021-06-22 ENCOUNTER — Ambulatory Visit (INDEPENDENT_AMBULATORY_CARE_PROVIDER_SITE_OTHER): Payer: BC Managed Care – PPO

## 2021-06-22 DIAGNOSIS — J309 Allergic rhinitis, unspecified: Secondary | ICD-10-CM | POA: Diagnosis not present

## 2021-07-06 ENCOUNTER — Ambulatory Visit (INDEPENDENT_AMBULATORY_CARE_PROVIDER_SITE_OTHER): Payer: BC Managed Care – PPO

## 2021-07-06 DIAGNOSIS — J309 Allergic rhinitis, unspecified: Secondary | ICD-10-CM | POA: Diagnosis not present

## 2021-07-12 ENCOUNTER — Ambulatory Visit (INDEPENDENT_AMBULATORY_CARE_PROVIDER_SITE_OTHER): Payer: BC Managed Care – PPO

## 2021-07-12 DIAGNOSIS — J309 Allergic rhinitis, unspecified: Secondary | ICD-10-CM

## 2021-07-17 ENCOUNTER — Ambulatory Visit (INDEPENDENT_AMBULATORY_CARE_PROVIDER_SITE_OTHER): Payer: BC Managed Care – PPO

## 2021-07-17 DIAGNOSIS — J309 Allergic rhinitis, unspecified: Secondary | ICD-10-CM | POA: Diagnosis not present

## 2021-07-25 ENCOUNTER — Ambulatory Visit: Payer: BC Managed Care – PPO | Admitting: Internal Medicine

## 2021-08-03 ENCOUNTER — Telehealth: Payer: Self-pay

## 2021-08-03 MED ORDER — CARBINOXAMINE MALEATE 4 MG PO TABS: ORAL_TABLET | ORAL | 0 refills | Status: DC

## 2021-08-03 NOTE — Addendum Note (Signed)
Addended by: Areta Haber B on: 08/03/2021 04:26 PM   Modules accepted: Orders

## 2021-08-03 NOTE — Telephone Encounter (Signed)
Called patient - LMOVM in detail advising medication refill for Carbinoxamine Maleate 4 mg has electronically been sent to [facility]. Patient was also reminded to keep appt on 08/25/21 @ 1:30 pm. For future refills and to contact the office if she has any questions - office number given.

## 2021-08-03 NOTE — Telephone Encounter (Signed)
Patient called to get a refill on Carbinoxamine Maleate 4 MG TABS.   [facility] [address]

## 2021-08-09 ENCOUNTER — Ambulatory Visit (INDEPENDENT_AMBULATORY_CARE_PROVIDER_SITE_OTHER): Payer: BC Managed Care – PPO

## 2021-08-09 DIAGNOSIS — J309 Allergic rhinitis, unspecified: Secondary | ICD-10-CM | POA: Diagnosis not present

## 2021-08-18 ENCOUNTER — Ambulatory Visit (INDEPENDENT_AMBULATORY_CARE_PROVIDER_SITE_OTHER): Payer: BC Managed Care – PPO

## 2021-08-18 DIAGNOSIS — J309 Allergic rhinitis, unspecified: Secondary | ICD-10-CM | POA: Diagnosis not present

## 2021-08-24 NOTE — Patient Instructions (Addendum)
Allergic rhinitis ?Continue allergen avoidance measures directed toward pollen, pets, dust mite, and mold as listed below ?Continue allergen immunotherapy and have access to an epinephrine autoinjector set ?Begin Xyzal 5 mg once a day as needed for a runny nose or itch.  You may take an additional Xyzal 5 mg once a day for breakthrough symptoms ?Continue Xhance 2 sprays in each nostril twice a day for nasal congestion ?Continue saline nasal rinses as needed for nasal symptoms. Use this before any medicated nasal sprays for best result ?Begin nasal saline gel as needed for dry nostrils ? ?Allergic conjunctivitis ?Continue Opcon-A eyedrops 1 to 2 drops in each eye up to 4 times a day as needed.  Some other over the counter eye drops include Pataday one drop in each eye once a day as needed for red, itchy eyes OR Zaditor one drop in each eye twice a day as needed for red itchy eyes. ? ?Atopic dermatitis ?Continue with twice a day moisturizing routine ?Continue Eucrisa to red itchy areas up to twice a day as needed ?For stubborn red itchy areas below your face, continue triamcinolone 0.1% ointment up to twice a day as needed.  Do not use this medication for longer than 3 weeks in a row ? ?Food allergy ?Continue to avoid soy, peanut, and tree nuts.  In case of an allergic reaction, take Benadryl 50 mg every 4 hours, and if life-threatening symptoms occur, inject with EpiPen 0.3 mg. ? ?Oral allergy syndrome ?The oral allergy syndrome (OAS) or pollen-food allergy syndrome (PFAS) is a relatively common form of food allergy, particularly in adults. It typically occurs in people who have pollen allergies when the immune system "sees" proteins on the food that look like proteins on the pollen. This results in the allergy antibody (IgE) binding to the food instead of the pollen. Patients typically report itching and/or mild swelling of the mouth and throat immediately following ingestion of certain uncooked fruits (including  nuts) or raw vegetables. Only a very small number of affected individuals experience systemic allergic reactions, such as anaphylaxis which occurs with true food allergies.   ? ? ? ?Call the clinic if this treatment plan is not working well for you. ? ?Follow up in 6 months or sooner if needed. ? ?Reducing Pollen Exposure ?The American Academy of Allergy, Asthma and Immunology suggests the following steps to reduce your exposure to pollen during allergy seasons. ?Do not hang sheets or clothing out to dry; pollen may collect on these items. ?Do not mow lawns or spend time around freshly cut grass; mowing stirs up pollen. ?Keep windows closed at night.  Keep car windows closed while driving. ?Minimize morning activities outdoors, a time when pollen counts are usually at their highest. ?Stay indoors as much as possible when pollen counts or humidity is high and on windy days when pollen tends to remain in the air longer. ?Use air conditioning when possible.  Many air conditioners have filters that trap the pollen spores. ?Use a HEPA room air filter to remove pollen form the indoor air you breathe. ? ?Control of Dog or Cat Allergen ?Avoidance is the best way to manage a dog or cat allergy. If you have a dog or cat and are allergic to dog or cats, consider removing the dog or cat from the home. ?If you have a dog or cat but don?t want to find it a new home, or if your family wants a pet even though someone in the household is  allergic, here are some strategies that may help keep symptoms at bay: ? ?Keep the pet out of your bedroom and restrict it to only a few rooms. Be advised that keeping the dog or cat in only one room will not limit the allergens to that room. ?Don?t pet, hug or kiss the dog or cat; if you do, wash your hands with soap and water. ?High-efficiency particulate air (HEPA) cleaners run continuously in a bedroom or living room can reduce allergen levels over time. ?Regular use of a high-efficiency vacuum  cleaner or a central vacuum can reduce allergen levels. ?Giving your dog or cat a bath at least once a week can reduce airborne allergen. ? ?Control of Mold Allergen ?Mold and fungi can grow on a variety of surfaces provided certain temperature and moisture conditions exist.  Outdoor molds grow on plants, decaying vegetation and soil.  The major outdoor mold, Alternaria and Cladosporium, are found in very high numbers during hot and dry conditions.  Generally, a late Summer - Fall peak is seen for common outdoor fungal spores.  Rain will temporarily lower outdoor mold spore count, but counts rise rapidly when the rainy period ends.  The most important indoor molds are Aspergillus and Penicillium.  Dark, humid and poorly ventilated basements are ideal sites for mold growth.  The next most common sites of mold growth are the bathroom and the kitchen. ? ?Outdoor [employer] ?Use air conditioning and keep windows closed ?Avoid exposure to decaying vegetation. ?Avoid leaf raking. ?Avoid grain handling. ?Consider wearing a face mask if working in moldy areas. ? ?Indoor Mold Control ?Maintain humidity below 50%. ?Clean washable surfaces with 5% bleach solution. ?Remove sources e.g. Contaminated carpets. ? ? ?Control of Dust Mite Allergen ?Dust mites play a major role in allergic asthma and rhinitis. They occur in environments with high humidity wherever human skin is found. Dust mites absorb humidity from the atmosphere (ie, they do not drink) and feed on organic matter (including shed human and animal skin). Dust mites are a microscopic type of insect that you cannot see with the naked eye. High levels of dust mites have been detected from mattresses, pillows, carpets, upholstered furniture, bed covers, clothes, soft toys and any woven material. The principal allergen of the dust mite is found in its feces. A gram of dust may contain 1,000 mites and 250,000 fecal particles. Mite antigen is easily measured in the air  during house cleaning activities. Dust mites do not bite and do not cause harm to humans, other than by triggering allergies/asthma. ? ?Ways to decrease your exposure to dust mites in your home: ? ?1. Encase mattresses, box springs and pillows with a mite-impermeable barrier or cover ? ?2. Wash sheets, blankets and drapes weekly in hot water (130? F) with detergent and dry them in a dryer on the hot setting. ? ?3. Have the room cleaned frequently with a vacuum cleaner and a damp dust-mop. For carpeting or rugs, vacuuming with a vacuum cleaner equipped with a high-efficiency particulate air (HEPA) filter. The dust mite allergic individual should not be in a room which is being cleaned and should wait 1 hour after cleaning before going into the room. ? ?4. Do not sleep on upholstered furniture (eg, couches). ? ?5. If possible removing carpeting, upholstered furniture and drapery from the home is ideal. Horizontal blinds should be eliminated in the rooms where the person spends the most time (bedroom, study, television room). Washable vinyl, roller-type shades are optimal. ? ?6.  Remove all non-washable stuffed toys from the bedroom. Wash stuffed toys weekly like sheets and blankets above. ? ?7. Reduce indoor humidity to less than 50%. Inexpensive humidity monitors can be purchased at most hardware stores. Do not use a humidifier as can make the problem worse and are not recommended. ? ?Oral allergy syndrome ? ?

## 2021-08-24 NOTE — Progress Notes (Signed)
? ?[place] [address] ?Dept: [phone] ? ?FOLLOW UP NOTE ? ?Patient ID: Cassandra Schultz, female    DOB: 10/11/2000  Age: 21 y.o. MRN: [place]:[id number] ?Date of Office Visit: 08/25/2021 ? ?Assessment  ?Chief Complaint: Seasonal and Perennial allergic rhinitis (Follow up --Nasal -clear; ears - inner itchy; itchy throat; eyes -dry, itchy x 2-3 wks) ? ?HPI ?Cassandra Schultz is a 21 year old female who presents the clinic for follow-up visit.  She was last seen in this clinic on 12/29/2020 by Althea Charon, FNP, for evaluation of allergic rhinitis, allergic conjunctivitis, atopic dermatitis, and food allergy to peanut and tree nuts.  At today's visit, she reports allergic rhinitis has been poorly controlled with symptoms including occasional clear rhinorrhea, nasal congestion occurring daily, postnasal drainage that began a few weeks ago, and itch occurring in her nose and ears.  She reports an increase in symptoms of allergic rhinitis when she is close to cats or dogs.  She continues carbinoxamine two 4 mg tablets twice a day and occasional saline nasal rinses.  She is not currently using a steroid nasal spray.  She reports that she has tried Zyrtec, Xyzal, Allegra, and Claritin with mild relief of symptoms.  She is not currently using Xhance, although, she reports that she did feel relief with this medication.  She continues allergen immunotherapy with no large or local reactions.  Allergic conjunctivitis is reported as poorly controlled with red and itchy eyes for which she has not used any allergy eyedrops at this time.  Atopic dermatitis is reported as moderately well controlled in a flare in remission pattern occurring mostly on her right thigh and right antecubital fossa about once a year.  She continues a daily moisturizing routine with Shea butter, Eucerin, or Aveeno and rarely uses triamcinolone.  She continues to avoid peanut and tree nuts with no accidental ingestion or EpiPen use since  her last visit to this clinic.  She does report that soy proteins give her indigestion and has been avoiding soy proteins at this time.  She continues to avoid all fresh fruits and vegetables as these produce throat itching.  Her current medications are listed in the chart. ? ? ?Drug Allergies:  ?Allergies  ?Allergen Reactions  ? Other Anaphylaxis  ?  All tree nuts, seasonal allergies dust, mold, pollen, horses, cockroaches, cats  ? Peanut-Containing Drug Products Anaphylaxis  ? Acyclovir And Related   ? Augmentin [Amoxicillin-Pot Clavulanate] Hives  ?  Patient says she has no allergies to antibiotics "As far as I know"  ? ? ?Physical Exam: ?BP 122/72   Pulse (!) 114   Temp 97.8 ?F (36.6 ?C)   Resp (!) 22   Ht 5\' 9"  (1.753 m)   Wt 157 lb (71.2 kg)   SpO2 98%   BMI 23.18 kg/m?   ? ?Physical Exam ?Vitals reviewed.  ?Constitutional:   ?   Appearance: Normal appearance.  ?HENT:  ?   Head: Normocephalic and atraumatic.  ?   Right Ear: Tympanic membrane normal.  ?   Left Ear: Tympanic membrane normal.  ?   Nose:  ?   Comments: Bilateral naris edematous and pale with clear nasal drainage noted.  Pharynx erythematous with no exudate.  Ears normal.  Eyes normal. ?Eyes:  ?   Conjunctiva/sclera: Conjunctivae normal.  ?Cardiovascular:  ?   Rate and Rhythm: Normal rate and regular rhythm.  ?   Heart sounds: Normal heart sounds. No murmur heard. ?Pulmonary:  ?   Effort: Pulmonary  effort is normal.  ?   Breath sounds: Normal breath sounds.  ?   Comments: Lungs clear to auscultation ?Musculoskeletal:     ?   General: Normal range of motion.  ?   Cervical back: Normal range of motion and neck supple.  ?Skin: ?   General: Skin is warm and dry.  ?Neurological:  ?   Mental Status: She is alert and oriented to person, place, and time.  ?Psychiatric:     ?   Mood and Affect: Mood normal.     ?   Behavior: Behavior normal.     ?   Thought Content: Thought content normal.     ?   Judgment: Judgment normal.  ? ? ?Assessment and  Plan: ?1. Seasonal and perennial allergic rhinitis   ?2. Seasonal allergic conjunctivitis   ?3. Intrinsic atopic dermatitis   ?4. Allergy with anaphylaxis due to food   ?5. Pollen-food allergy, subsequent encounter   ? ? ?Meds ordered this encounter  ?Medications  ? Fluticasone Propionate (XHANCE) 40 MCG/ACT EXHU  ?  Sig: Place 2 sprays in each nostril twice a day for nasal congestion  ?  Dispense:  16 mL  ?  Refill:  2  ? ? ?Patient Instructions  ?Allergic rhinitis ?Continue allergen avoidance measures directed toward pollen, pets, dust mite, and mold as listed below ?Continue allergen immunotherapy and have access to an epinephrine autoinjector set ?Begin Xyzal 5 mg once a day as needed for a runny nose or itch.  You may take an additional Xyzal 5 mg once a day for breakthrough symptoms ?Continue Xhance 2 sprays in each nostril twice a day for nasal congestion ?Continue saline nasal rinses as needed for nasal symptoms. Use this before any medicated nasal sprays for best result ?Begin nasal saline gel as needed for dry nostrils ? ?Allergic conjunctivitis ?Continue Opcon-A eyedrops 1 to 2 drops in each eye up to 4 times a day as needed.  Some other over the counter eye drops include Pataday one drop in each eye once a day as needed for red, itchy eyes OR Zaditor one drop in each eye twice a day as needed for red itchy eyes. ? ?Atopic dermatitis ?Continue with twice a day moisturizing routine ?Continue Eucrisa to red itchy areas up to twice a day as needed ?For stubborn red itchy areas below your face, continue triamcinolone 0.1% ointment up to twice a day as needed.  Do not use this medication for longer than 3 weeks in a row ? ?Food allergy ?Continue to avoid soy, peanut, and tree nuts.  In case of an allergic reaction, take Benadryl 50 mg every 4 hours, and if life-threatening symptoms occur, inject with EpiPen 0.3 mg. ? ?Oral allergy syndrome ?The oral allergy syndrome (OAS) or pollen-food allergy syndrome (PFAS)  is a relatively common form of food allergy, particularly in adults. It typically occurs in people who have pollen allergies when the immune system "sees" proteins on the food that look like proteins on the pollen. This results in the allergy antibody (IgE) binding to the food instead of the pollen. Patients typically report itching and/or mild swelling of the mouth and throat immediately following ingestion of certain uncooked fruits (including nuts) or raw vegetables. Only a very small number of affected individuals experience systemic allergic reactions, such as anaphylaxis which occurs with true food allergies.   ? ?Call the clinic if this treatment plan is not working well for you. ? ?Follow up in 6 months  or sooner if needed. ? ? ?Return in about 6 months (around 02/25/2022), or if symptoms worsen or fail to improve. ?  ? ?Thank you for the opportunity to care for this patient.  Please do not hesitate to contact me with questions. ? ?Gareth Morgan, FNP ?[facility] ? ? ? ? ? ?

## 2021-08-25 ENCOUNTER — Other Ambulatory Visit: Payer: Self-pay | Admitting: Family Medicine

## 2021-08-25 ENCOUNTER — Ambulatory Visit (INDEPENDENT_AMBULATORY_CARE_PROVIDER_SITE_OTHER): Payer: BC Managed Care – PPO | Admitting: Family Medicine

## 2021-08-25 ENCOUNTER — Other Ambulatory Visit: Payer: Self-pay

## 2021-08-25 ENCOUNTER — Encounter: Payer: Self-pay | Admitting: Family Medicine

## 2021-08-25 VITALS — BP 122/72 | HR 114 | Temp 97.8°F | Resp 22 | Ht 69.0 in | Wt 157.0 lb

## 2021-08-25 DIAGNOSIS — H1010 Acute atopic conjunctivitis, unspecified eye: Secondary | ICD-10-CM

## 2021-08-25 DIAGNOSIS — L2084 Intrinsic (allergic) eczema: Secondary | ICD-10-CM | POA: Diagnosis not present

## 2021-08-25 DIAGNOSIS — J302 Other seasonal allergic rhinitis: Secondary | ICD-10-CM

## 2021-08-25 DIAGNOSIS — J309 Allergic rhinitis, unspecified: Secondary | ICD-10-CM

## 2021-08-25 DIAGNOSIS — T781XXD Other adverse food reactions, not elsewhere classified, subsequent encounter: Secondary | ICD-10-CM

## 2021-08-25 DIAGNOSIS — H1013 Acute atopic conjunctivitis, bilateral: Secondary | ICD-10-CM | POA: Diagnosis not present

## 2021-08-25 DIAGNOSIS — T781XXA Other adverse food reactions, not elsewhere classified, initial encounter: Secondary | ICD-10-CM | POA: Insufficient documentation

## 2021-08-25 DIAGNOSIS — J3089 Other allergic rhinitis: Secondary | ICD-10-CM

## 2021-08-25 DIAGNOSIS — T7800XA Anaphylactic reaction due to unspecified food, initial encounter: Secondary | ICD-10-CM

## 2021-08-25 MED ORDER — XHANCE 93 MCG/ACT NA EXHU: INHALANT_SUSPENSION | NASAL | 2 refills | Status: DC

## 2021-08-28 ENCOUNTER — Ambulatory Visit (INDEPENDENT_AMBULATORY_CARE_PROVIDER_SITE_OTHER): Payer: BC Managed Care – PPO

## 2021-08-28 DIAGNOSIS — J309 Allergic rhinitis, unspecified: Secondary | ICD-10-CM

## 2021-09-07 ENCOUNTER — Ambulatory Visit (INDEPENDENT_AMBULATORY_CARE_PROVIDER_SITE_OTHER): Payer: BC Managed Care – PPO

## 2021-09-07 DIAGNOSIS — J309 Allergic rhinitis, unspecified: Secondary | ICD-10-CM

## 2021-09-15 ENCOUNTER — Ambulatory Visit (INDEPENDENT_AMBULATORY_CARE_PROVIDER_SITE_OTHER): Payer: BC Managed Care – PPO

## 2021-09-15 DIAGNOSIS — J309 Allergic rhinitis, unspecified: Secondary | ICD-10-CM | POA: Diagnosis not present

## 2021-09-21 ENCOUNTER — Ambulatory Visit (INDEPENDENT_AMBULATORY_CARE_PROVIDER_SITE_OTHER): Payer: BC Managed Care – PPO

## 2021-09-21 DIAGNOSIS — J309 Allergic rhinitis, unspecified: Secondary | ICD-10-CM | POA: Diagnosis not present

## 2021-09-29 ENCOUNTER — Ambulatory Visit (INDEPENDENT_AMBULATORY_CARE_PROVIDER_SITE_OTHER): Payer: BC Managed Care – PPO

## 2021-09-29 DIAGNOSIS — J309 Allergic rhinitis, unspecified: Secondary | ICD-10-CM | POA: Diagnosis not present

## 2021-10-03 NOTE — Therapy (Deleted)
OUTPATIENT PHYSICAL THERAPY THORACOLUMBAR EVALUATION   Patient Name: Cassandra Schultz MRN: [name] DOB:03/30/2001, 21 y.o., female Today's Date: 10/03/2021    Past Medical History:  Diagnosis Date   ADHD    Allergy    Anxiety    Depression    reports ADHD    Eczema    Raynaud's syndrome    Tremor of both hands    Past Surgical History:  Procedure Laterality Date   NO PAST SURGERIES     WISDOM TOOTH EXTRACTION     Patient Active Problem List   Diagnosis Date Noted   Pollen-food allergy 08/25/2021   SVT (supraventricular tachycardia) (HCC) 05/09/2021   Palpitations 04/17/2021   Seasonal and perennial allergic rhinitis 09/21/2020   Seasonal allergic conjunctivitis 09/21/2020   Intrinsic atopic dermatitis 09/21/2020   Numbness and tingling of both legs - comes and goes after activity and onset of pain 08/03/2019   Fatigue 05/27/2019   Low back pain 02/24/2019   Bilateral knee pain 02/24/2019   Bilateral hip pain 02/24/2019   Acute bilateral ankle pain 02/24/2019   Healthcare maintenance 12/23/2018   Generalized anxiety disorder 04/03/2018   Attention deficit hyperactivity disorder (ADHD), combined type, severe 04/03/2018   Mild recurrent major depression (HCC) 04/03/2018   Developmental coordination disorder 04/03/2018   Allergy with anaphylaxis due to food 02/02/2016   Allergic rhinitis due to pollen 02/02/2016    PCP: Wilfrid LundBecker, Cassandra G, PA  REFERRING PROVIDER: Aliene BeamsHagler, Rachel, MD  REFERRING DIAG: ***  THERAPY DIAG:  No diagnosis found.  ONSET DATE: ***  SUBJECTIVE:                                                                                                                                                                                           SUBJECTIVE STATEMENT: *** PERTINENT HISTORY:  MANUAL MUSCLE TESTING:  Lower Extremity: Gluteus maximus: 4-/5(left); 4-/5(right) Gluteus medius: 3+/5 (left); 3+/5 (right) Quadriceps: 5/5 (left); 5/5  (right) Hamstrings: 4+/5 (left); 4+/5 (right)  SPECIAL TESTING: Multifidus Lift Test: Robust contraction noted on the left, fair contraction noted on the right  BALANCE/PROPRIOCEPTION: SLS: Right-10 seconds*/Left-10 seconds* * note lateral trunk lean (gluteus medius weakness), unsteadiness, Trendelenburg, trunk flexion, abduction of opposite leg using other limb for stability, increased ankle strategies, midfoot pronation, genu recurvatum, internal rotation of stance leg   PAIN:  Are you having pain? Yes: NPRS scale: ***/10 Pain location: *** Pain description: *** Aggravating factors: *** Relieving factors: ***   PRECAUTIONS: {Therapy precautions:24002}  WEIGHT BEARING RESTRICTIONS {Yes ***/No:24003}  FALLS:  Has patient fallen in last 6 months? {fallsyesno:27318}  LIVING ENVIRONMENT: Lives with: {OPRC lives with:25569::"lives with their  family"} Lives in: {Lives in:25570} Stairs: {opstairs:27293} Has following equipment at home: {Assistive devices:23999}  OCCUPATION: ***  PLOF: {PLOF:24004}  PATIENT GOALS ***   OBJECTIVE:   DIAGNOSTIC FINDINGS:  ***  PATIENT SURVEYS:  {rehab surveys:24030}   COGNITION:  Overall cognitive status: {cognition:24006}     SENSATION: {sensation:27233}  MUSCLE LENGTH: Hamstrings: Right *** deg; Left *** deg Thomas test: Right *** deg; Left *** deg  POSTURE:  ***  PALPATION: ***  LUMBAR ROM:   {AROM/PROM:27142}  A/PROM  10/03/2021  Flexion   Extension   Right lateral flexion   Left lateral flexion   Right rotation   Left rotation    (Blank rows = not tested)  LE ROM:  {AROM/PROM:27142}  Right 10/03/2021 Left 10/03/2021  Hip flexion    Hip extension    Hip abduction    Hip adduction    Hip internal rotation    Hip external rotation    Knee flexion    Knee extension    Ankle dorsiflexion    Ankle plantarflexion    Ankle inversion    Ankle eversion     (Blank rows = not tested)  LE MMT:  MMT  Right 10/03/2021 Left 10/03/2021  Hip flexion    Hip extension    Hip abduction    Hip adduction    Hip internal rotation    Hip external rotation    Knee flexion    Knee extension    Ankle dorsiflexion    Ankle plantarflexion    Ankle inversion    Ankle eversion     (Blank rows = not tested)  LUMBAR SPECIAL TESTS:  {lumbar special test:25242}  FUNCTIONAL TESTS:  {Functional tests:24029}  GAIT: Distance walked: *** Assistive device utilized: {Assistive devices:23999} Level of assistance: {Levels of assistance:24026} Comments: ***    TODAY'S TREATMENT  ***   PATIENT EDUCATION:  Education details: *** Person educated: {Person educated:25204} Education method: {Education Method:25205} Education comprehension: {Education Comprehension:25206}   HOME EXERCISE PROGRAM: ***  ASSESSMENT:  CLINICAL IMPRESSION: Patient is a 21  y.o. female who was seen today for physical therapy evaluation and treatment for low back pain associated with joint hypermobility. She is familiar to me from several previous PT episodes.     OBJECTIVE IMPAIRMENTS decreased activity tolerance, decreased mobility, difficulty walking, decreased ROM, decreased strength, postural dysfunction, and pain.   ACTIVITY LIMITATIONS community activity, occupation, and recreation .   PERSONAL FACTORS Behavior pattern, Past/current experiences, Time since onset of injury/illness/exacerbation, and 3+ comorbidities: *** are also affecting patient's functional outcome.    REHAB POTENTIAL: Excellent  CLINICAL DECISION MAKING: Stable/uncomplicated  EVALUATION COMPLEXITY: Low   GOALS: Goals reviewed with patient? {yes/no:20286}  SHORT TERM GOALS: Target date: {follow up:25551}  *** Baseline: Goal status: {GOALSTATUS:25110}  2.  *** Baseline:  Goal status: {GOALSTATUS:25110}  3.  *** Baseline:  Goal status: {GOALSTATUS:25110}  4.  *** Baseline:  Goal status: {GOALSTATUS:25110}  5.   *** Baseline:  Goal status: {GOALSTATUS:25110}  6.  *** Baseline:  Goal status: {GOALSTATUS:25110}  LONG TERM GOALS: Target date: {follow up:25551}  *** Baseline:  Goal status: {GOALSTATUS:25110}  2.  *** Baseline:  Goal status: {GOALSTATUS:25110}  3.  *** Baseline:  Goal status: {GOALSTATUS:25110}  4.  *** Baseline:  Goal status: {GOALSTATUS:25110}  5.  *** Baseline:  Goal status: {GOALSTATUS:25110}  6.  *** Baseline:  Goal status: {GOALSTATUS:25110}   PLAN: PT FREQUENCY: {rehab frequency:25116}  PT DURATION: {rehab duration:25117}  PLANNED INTERVENTIONS: {rehab planned interventions:25118::"Therapeutic exercises","Therapeutic activity","Neuromuscular re-education","Balance  training","Gait training","Patient/Family education","Joint mobilization"}.  PLAN FOR NEXT SESSION: ***   Jarek Longton, PT 10/03/2021, 7:40 PM

## 2021-10-04 ENCOUNTER — Ambulatory Visit: Payer: BC Managed Care – PPO | Attending: Family Medicine | Admitting: Physical Therapy

## 2021-10-04 NOTE — Therapy (Signed)
Rehab visit no charge: During subjective intake pt requested to reschedule the Eval with [name], the PT who has provided her care for the past several years. PT eval was discontinued.  ? ? ? ? ? ? ? ? ? ? ? ? ? ? ? ? ? ? ? ? ? ? ? ?

## 2021-10-05 ENCOUNTER — Ambulatory Visit: Payer: BC Managed Care – PPO

## 2021-10-11 ENCOUNTER — Ambulatory Visit (INDEPENDENT_AMBULATORY_CARE_PROVIDER_SITE_OTHER): Payer: BC Managed Care – PPO | Admitting: *Deleted

## 2021-10-11 DIAGNOSIS — J309 Allergic rhinitis, unspecified: Secondary | ICD-10-CM

## 2021-10-16 ENCOUNTER — Ambulatory Visit: Payer: BC Managed Care – PPO | Admitting: Physical Therapy

## 2021-10-20 ENCOUNTER — Ambulatory Visit (INDEPENDENT_AMBULATORY_CARE_PROVIDER_SITE_OTHER): Payer: BC Managed Care – PPO

## 2021-10-20 DIAGNOSIS — J309 Allergic rhinitis, unspecified: Secondary | ICD-10-CM

## 2021-10-31 NOTE — Progress Notes (Signed)
VIALS EXP 11-01-22 ?

## 2021-11-01 DIAGNOSIS — J3089 Other allergic rhinitis: Secondary | ICD-10-CM | POA: Diagnosis not present

## 2021-11-02 ENCOUNTER — Ambulatory Visit (INDEPENDENT_AMBULATORY_CARE_PROVIDER_SITE_OTHER): Payer: BC Managed Care – PPO

## 2021-11-02 DIAGNOSIS — J309 Allergic rhinitis, unspecified: Secondary | ICD-10-CM

## 2021-11-03 ENCOUNTER — Ambulatory Visit: Payer: BC Managed Care – PPO | Admitting: Internal Medicine

## 2021-11-03 NOTE — Progress Notes (Deleted)
Cardiology Office Note:    Date:  11/03/2021   ID:  Cassandra Schultz, DOB 04-20-2001, MRN [id number]  PCP:  Wilfrid Lund, PA   [facility] Providers Cardiologist:  Maisie Fus, MD     Referring MD: Wilfrid Lund, PA   No chief complaint on file.  Palpitations, tachycardia   History of Present Illness:    Cassandra Schultz is a 21 y.o. female with a hx of ADHD on adderal, who comes in with palpitations.  Catharine notes at work her heart rate is in the 130s to 140s. This has been going on since highschool. She notes bouts of fast heart rates in highschool.  She has LH, dizziness. She denies syncope. If she stands from sitting  she notes the [facility] and dizzy. It happens occasionally. She drinks caffeine. She has cut back from 16 oz per day. She takes adderal she was on 30 mg, she could not eat and lost weight. It was reduced to adderal 20 mg daily. She works part time at [employer]. She has no family hx of SCD. She has no hx of congential heart disease. She thinks her father had MI in his 43s.Her half brother has whole in his heart.    Orthostatics:  Lying 100 / 63 mmHg pulse 88 Sitting 108/71 mmHg pulse 111 Stand 95/63 mmHg Stand (3 min) 10/66 mg pulse 113  TTE 04/04/2020 Normal LVEF, RV No valve dx No pulmonary htn  Interim Hx: Patient noted to have some SVT on ziopatch. She is symptomatic. Planned for metop but patient preferred propanolol/ Today, she comes in and states she is taking  propanolol. 10 mg daily. She is feeling less fast heart rates. She does not feel SOB, or tired. She is fine with this dose for now.   Interim Hx 5/18 Last visit she did not meet criteria for POTS. She has mild SVT on propanolol. Last visit I recommended follow up in one year.   Cardiology Studies 04/04/2020- normal echo Zio patch 04/17/2021-05/09/2021: Avg HR 100 bpm, runs of long RP tachycardia    Past Medical History:  Diagnosis Date   ADHD    Allergy    Anxiety    Depression     reports ADHD    Eczema    Raynaud's syndrome    Tremor of both hands     Past Surgical History:  Procedure Laterality Date   NO PAST SURGERIES     WISDOM TOOTH EXTRACTION      Current Medications: No outpatient medications have been marked as taking for the 11/03/21 encounter (Appointment) with Maisie Fus, MD.     Allergies:   Other, Peanut-containing drug products, Acyclovir and related, and Augmentin [amoxicillin-pot clavulanate]   Social History   Socioeconomic History   Marital status: Single    Spouse name: Not on file   Number of children: 0   Years of education: Not on file   Highest education level: Some college, no degree  Occupational History   Not on file  Tobacco Use   Smoking status: Never   Smokeless tobacco: Never  Vaping Use   Vaping Use: Never used  Substance and Sexual Activity   Alcohol use: Never   Drug use: Never   Sexual activity: Yes    Birth control/protection: Condom, Pill  Other Topics Concern   Not on file  Social History Narrative   Lives at home with roommate   Caffeine: approx. 25-50 mg some days       **  Merged History Encounter **       Social Determinants of Health   Financial Resource Strain: Not on file  Food Insecurity: Not on file  Transportation Needs: Not on file  Physical Activity: Not on file  Stress: Not on file  Social Connections: Not on file     Family History: The patient's family history includes Alcohol abuse in her maternal grandfather and maternal grandmother; Allergic rhinitis in her father, mother, and paternal aunt; Angina in her maternal grandmother; Arthritis in her mother; Depression in her maternal grandmother and mother; Luiz Blare' disease in her mother; Hypothyroidism in her maternal grandmother; Neuropathy in her father; Other in her paternal grandfather. There is no history of Angioedema, Asthma, Eczema, Immunodeficiency, or Urticaria.  ROS:   Please see the history of present illness.      All other systems reviewed and are negative.  EKGs/Labs/Other Studies Reviewed:    The following studies were reviewed today:   EKG:  EKG is  ordered today.  The ekg ordered today demonstrates   04/17/2021-NSR Qtc  Recent Labs: No results found for requested labs within last 8760 hours.  Recent Lipid Panel    Component Value Date/Time   LDLDIRECT 74 05/27/2019 1344     Risk Assessment/Calculations:           Physical Exam:    VS:  There were no vitals taken for this visit.    Wt Readings from Last 3 Encounters:  08/25/21 157 lb (71.2 kg)  05/23/21 160 lb 6.4 oz (72.8 kg)  04/17/21 154 lb 3.2 oz (69.9 kg)     GEN:  Well nourished, well developed in no acute distress HEENT: moist mucous membranes CARDIAC: RRR, no murmurs, rubs, gallops RESPIRATORY:  Clear to auscultation without rales, wheezing or rhonchi  ABDOMEN: Soft, non-tender, non-distended MUSCULOSKELETAL:  No edema; No deformity  SKIN: Warm and dry NEUROLOGIC:  Alert and oriented x 3 PSYCHIATRIC:  Normal affect   ASSESSMENT:    #SVT/Sinus tachycardia:  Initially screened for POTS. She had tachycardia with 3 minutes of standing c/f possible POTS however heart rate < 30 bpm increase. She was not orthostatic.   She had a zio 04/17/2021 that showed average HR 100, runs of  long RP SVT. She had a normal echo. No family hx of SCD. She is on adderall which contributes to high heart rates. Will continue propanol at 10 mg daily can uptitrate with symptoms  PLAN:    In order of problems listed above:  Continue propanolol 10 mg daily Follow up in 1 year   Medication Adjustments/Labs and Tests Ordered: Current medicines are reviewed at length with the patient today.  Concerns regarding medicines are outlined above.    Signed, Maisie Fus, MD  11/03/2021 11:39 AM    [facility]

## 2021-11-07 ENCOUNTER — Encounter: Payer: Self-pay | Admitting: Internal Medicine

## 2021-11-14 ENCOUNTER — Ambulatory Visit (INDEPENDENT_AMBULATORY_CARE_PROVIDER_SITE_OTHER): Payer: BC Managed Care – PPO | Admitting: Internal Medicine

## 2021-11-14 ENCOUNTER — Encounter: Payer: Self-pay | Admitting: Internal Medicine

## 2021-11-14 DIAGNOSIS — R000 Tachycardia, unspecified: Secondary | ICD-10-CM

## 2021-11-14 MED ORDER — PROPRANOLOL HCL 20 MG PO TABS: 20.0000 mg | ORAL_TABLET | Freq: Two times a day (BID) | ORAL | 3 refills | Status: DC

## 2021-11-14 NOTE — Patient Instructions (Signed)
Medication Instructions:  Please take Propanolol 20mg  EVERY MORNING  May take ADDITIONAL DOSE FOR A TOTAL OF 20mg  TWICE DAILY AS NEEDED FOR PALPITATIONS *If you need a refill on your cardiac medications before your next appointment, please call your pharmacy*  Follow-Up: At [facility], you and your health needs are our priority.  As part of our continuing mission to provide you with exceptional heart care, we have created designated Provider Care Teams.  These Care Teams include your primary Cardiologist (physician) and Advanced Practice Providers (APPs -  Physician Assistants and Nurse Practitioners) who all work together to provide you with the care you need, when you need it.  Your next appointment:   6 month(s)  The format for your next appointment:   In Person  Provider:   , MD

## 2021-11-14 NOTE — Progress Notes (Signed)
Cardiology Office Note:    Date:  11/14/2021   ID:  Cassandra Schultz, DOB 12-31-2000, MRN [id number]  PCP:  Wilfrid Lund, PA   [facility] Providers Cardiologist:  Maisie Fus, MD     Referring MD: Wilfrid Lund, PA   No chief complaint on file.  Palpitations, tachycardia   History of Present Illness:    Cassandra Schultz is a 21 y.o. female with a hx of ADHD on adderal, who comes in with palpitations.  Cassandra Schultz notes at work her heart rate is in the 130s to 140s. This has been going on since highschool. She notes bouts of fast heart rates in highschool.  She has LH, dizziness. She denies syncope. If she stands from sitting  she notes the [facility] and dizzy. It happens occasionally. She drinks caffeine. She has cut back from 16 oz per day. She takes adderal she was on 30 mg, she could not eat and lost weight. It was reduced to adderal 20 mg daily. She works part time at [employer]. She has no family hx of SCD. She has no hx of congential heart disease. She thinks her father had MI in his 82s.Her half brother has whole in his heart.    Orthostatics: Lying 100 / 63 mmHg pulse 88 Sitting 108/71 mmHg pulse 111 Stand 95/63 mmHg Stand (3 min) 10/66 mg pulse 113  TTE 04/04/2020 Normal LVEF, RV No valve dx No pulmonary htn  Interim Hx: Patient noted to have some SVT on ziopatch. She is symptomatic. Planned for metop but patient preferred propanolol/ Today, she comes in and states she is taking  propanolol. 10 mg daily. She is feeling less fast heart rates. She does not feel SOB, or tired. She is fine with this dose for now.   Interim Hx 5/30  Last visit she did not definitively meet criteria for POTS. She has mild SVT managed with propanolol. She notes with standing she notes higher heart rates. No recent loss consciousness. She feels persistently presyncopal with standing.  Cardiology Studies 04/04/2020- normal echo Zio patch 04/17/2021-05/09/2021: Avg HR 100 bpm, runs of long  RP tachycardia    Past Medical History:  Diagnosis Date   ADHD    Allergy    Anxiety    Depression    reports ADHD    Eczema    Raynaud's syndrome    Tremor of both hands     Past Surgical History:  Procedure Laterality Date   NO PAST SURGERIES     WISDOM TOOTH EXTRACTION      Current Medications: No outpatient medications have been marked as taking for the 11/14/21 encounter (Appointment) with Maisie Fus, MD.     Allergies:   Other, Peanut-containing drug products, Acyclovir and related, and Augmentin [amoxicillin-pot clavulanate]   Social History   Socioeconomic History   Marital status: Single    Spouse name: Not on file   Number of children: 0   Years of education: Not on file   Highest education level: Some college, no degree  Occupational History   Not on file  Tobacco Use   Smoking status: Never   Smokeless tobacco: Never  Vaping Use   Vaping Use: Never used  Substance and Sexual Activity   Alcohol use: Never   Drug use: Never   Sexual activity: Yes    Birth control/protection: Condom, Pill  Other Topics Concern   Not on file  Social History Narrative   Lives at home with roommate  Caffeine: approx. 25-50 mg some days       ** Merged History Encounter **       Social Determinants of Health   Financial Resource Strain: Not on file  Food Insecurity: Not on file  Transportation Needs: Not on file  Physical Activity: Not on file  Stress: Not on file  Social Connections: Not on file     Family History: The patient's family history includes Alcohol abuse in her maternal grandfather and maternal grandmother; Allergic rhinitis in her father, mother, and paternal aunt; Angina in her maternal grandmother; Arthritis in her mother; Depression in her maternal grandmother and mother; Luiz Blare' disease in her mother; Hypothyroidism in her maternal grandmother; Neuropathy in her father; Other in her paternal grandfather. There is no history of Angioedema,  Asthma, Eczema, Immunodeficiency, or Urticaria.  ROS:   Please see the history of present illness.     All other systems reviewed and are negative.  EKGs/Labs/Other Studies Reviewed:    The following studies were reviewed today:   EKG:  EKG is  ordered today.  The ekg ordered today demonstrates   04/17/2021-NSR Qtc  Recent Labs: No results found for requested labs within last 8760 hours.  Recent Lipid Panel    Component Value Date/Time   LDLDIRECT 74 05/27/2019 1344     Risk Assessment/Calculations:           Physical Exam:    VS:  There were no vitals taken for this visit.    Wt Readings from Last 3 Encounters:  08/25/21 157 lb (71.2 kg)  05/23/21 160 lb 6.4 oz (72.8 kg)  04/17/21 154 lb 3.2 oz (69.9 kg)     GEN:  Well nourished, well developed in no acute distress HEENT: moist mucous membranes CARDIAC: RRR, no murmurs, rubs, gallops RESPIRATORY:  Clear to auscultation without rales, wheezing or rhonchi  ABDOMEN: Soft, non-tender, non-distended MUSCULOSKELETAL:  No edema; No deformity  SKIN: Warm and dry NEUROLOGIC:  Alert and oriented x 3 PSYCHIATRIC:  Normal affect   ASSESSMENT:    #SVT/Sinus tachycardia:  Initially screened for POTS. She had tachycardia with 3 minutes of standing c/f possible POTS however heart rate < 30 bpm increase. She was not orthostatic.  Will refer her to syncope/dysautonomia clinic at [facility].   She had a zio 04/17/2021 that showed average HR 100, runs of  long RP SVT. She had a normal echo. No family hx of SCD. She is on adderall which contributes to high heart rates. Will continue to uptitrate her propanolol.   PLAN:    In order of problems listed above:  Increase propanolol to 20 mg in the AM and can uptitrate 20 mg BID Sending referral to syncope/dysautonomia clinic at [facility] for complete evaluation for POTS Follow up in 6 months   Medication Adjustments/Labs and Tests Ordered: Current medicines are reviewed at length  with the patient today.  Concerns regarding medicines are outlined above.    Signed, Maisie Fus, MD  11/14/2021 9:11 AM    [facility]

## 2021-11-21 ENCOUNTER — Ambulatory Visit (INDEPENDENT_AMBULATORY_CARE_PROVIDER_SITE_OTHER): Payer: BC Managed Care – PPO

## 2021-11-21 DIAGNOSIS — J309 Allergic rhinitis, unspecified: Secondary | ICD-10-CM | POA: Diagnosis not present

## 2021-11-28 ENCOUNTER — Ambulatory Visit (INDEPENDENT_AMBULATORY_CARE_PROVIDER_SITE_OTHER): Payer: BC Managed Care – PPO

## 2021-11-28 DIAGNOSIS — J309 Allergic rhinitis, unspecified: Secondary | ICD-10-CM

## 2021-12-04 NOTE — Therapy (Unsigned)
OUTPATIENT PHYSICAL THERAPY THORACOLUMBAR EVALUATION   Patient Name: Cassandra Schultz MRN: [id number] DOB:[birth year], 21 y.o., female Today's Date: 12/05/2021   PT End of Session - 12/05/21 1114     Visit Number 1    Number of Visits 12    Date for PT Re-Evaluation 01/16/22    Authorization Type BCBS    PT Start Time 1115    PT Stop Time 1205    PT Time Calculation (min) 50 min    Activity Tolerance Patient tolerated treatment well    Behavior During Therapy WFL for tasks assessed/performed             Past Medical History:  Diagnosis Date   ADHD    Allergy    Anxiety    Depression    reports ADHD    Eczema    Raynaud's syndrome    Tremor of both hands    Past Surgical History:  Procedure Laterality Date   NO PAST SURGERIES     WISDOM TOOTH EXTRACTION     Patient Active Problem List   Diagnosis Date Noted   Pollen-food allergy 08/25/2021   SVT (supraventricular tachycardia) (HCC) 05/09/2021   Palpitations 04/17/2021   Seasonal and perennial allergic rhinitis 09/21/2020   Seasonal allergic conjunctivitis 09/21/2020   Intrinsic atopic dermatitis 09/21/2020   Numbness and tingling of both legs - comes and goes after activity and onset of pain 08/03/2019   Fatigue 05/27/2019   Low back pain 02/24/2019   Bilateral knee pain 02/24/2019   Bilateral hip pain 02/24/2019   Acute bilateral ankle pain 02/24/2019   Healthcare maintenance 12/23/2018   Generalized anxiety disorder 04/03/2018   Attention deficit hyperactivity disorder (ADHD), combined type, severe 04/03/2018   Mild recurrent major depression (HCC) 04/03/2018   Developmental coordination disorder 04/03/2018   Allergy with anaphylaxis due to food 02/02/2016   Allergic rhinitis due to pollen 02/02/2016    PCP: Horton Marshall, PA   REFERRING PROVIDER: Teryl Lucy, MD   REFERRING DIAG: Lumbar radiculopathy, M79.18 (ICD-10-CM) - Myalgia, other site   Rationale for Evaluation and Treatment  Rehabilitation  THERAPY DIAG:  Myalgia, other site  Chronic bilateral low back pain without sciatica  Generalized joint pain  Muscle weakness (generalized)  ONSET DATE: chronic  SUBJECTIVE:                                                                                                                                                                                           SUBJECTIVE STATEMENT: Patient had a flare up in Feb and has been ongoing since then. She reports coming back from [facility] clinic and sitting 9  hours on the train. Pain was radiating into her LLE and was severe.  It has since gotten better, had steriod early May.  She would like to restart PT as she feels like when she tries to do her exercises she makes it worse.  She is unable to stay in one position for > 35  min (1 hour max). She notices a weakness in her legs and poor alignment.  Denies numbness and red flags.     PERTINENT HISTORY:  Hypermobility syndrome   PAIN:  Are you having pain? Yes: NPRS scale: 3/10 Pain location: mid to low back Rt buttock  Pain description: stabbing, burning  Aggravating factors: bending forward and prolonged positioning  Relieving factors: not much   PRECAUTIONS: None  WEIGHT BEARING RESTRICTIONS No  FALLS:  Has patient fallen in last 6 months? No  LIVING ENVIRONMENT: Lives with:  2 roommates Lives in: House/apartment Stairs: Yes: Internal: 3 floor  steps; on right going up Has following equipment at home: None  OCCUPATION: Works at [employer]  PLOF: Independent  PATIENT GOALS Patient would like to rebuild my strength    OBJECTIVE:   DIAGNOSTIC FINDINGS:  None recent avail.   PATIENT SURVEYS:  FOTO 26%  SCREENING FOR RED FLAGS: Bowel or bladder incontinence: No Spinal tumors: No Cauda equina syndrome: No Compression fracture: No Abdominal aneurysm: No  COGNITION:  Overall cognitive status: Within functional limits for tasks  assessed     SENSATION: WFL   POSTURE: rounded shoulders, forward head, and increased lumbar lordosis  PALPATION: Painful with palpation along paraspinals and R quadratus lumborum   LUMBAR ROM:   Active  A/PROM  eval  Flexion WFL knees bent , painful  Extension WFL pain   Right lateral flexion WFL pain   Left lateral flexion WFL pain   Right rotation WFL  Left rotation WFL   (Blank rows = not tested)   Uses hands to rise up from floor from flexion due to pain   LOWER EXTREMITY MMT  Active  Right eval Left eval  Hip flexion 5 5  Hip extension 4- 4-  Hip abduction    Hip adduction    Hip internal rotation    Hip external rotation    Knee flexion 4 4  Knee extension 4+ 4+  Ankle dorsiflexion    Ankle plantarflexion    Ankle inversion    Ankle eversion     (Blank rows = not tested)  LOWER EXTREMITY AROM:  MMT Right eval Left eval  Hip flexion    Hip extension    Hip abduction    Hip adduction    Hip internal rotation    Hip external rotation    Knee flexion    Knee extension    Ankle dorsiflexion    Ankle plantarflexion    Ankle inversion    Ankle eversion     (Blank rows = not tested)  LUMBAR SPECIAL TESTS:  Slump test: Positive bilateral . Neg SLR   FUNCTIONAL TESTS:  NT  GAIT: Distance walked: NT Assistive device utilized: None Level of assistance:  NT Comments: NT    TODAY'S TREATMENT  PT, HEP, stabilization   PATIENT EDUCATION:  Education details: PT/POC, HEP  Person educated: Patient Education method: Programmer, multimedia, Facilities manager, and Handouts Education comprehension: verbalized understanding, returned demonstration, and verbal cues required   HOME EXERCISE PROGRAM: Access Code: [email or web address] URL: [email or web address] Date: 12/05/2021 Prepared by: [name]  Exercises - Supine with Legs Supported at 90/90  -  2 x daily - 7 x weekly - 1 sets - 1 reps - 30 hold - Supine Diaphragmatic Breathing  - 2 x daily - 7 x  weekly - 2 sets - 10 reps - 10 hold - Sidelying Quadratus Lumborum Stretch on Table  - 1-2 x daily - 7 x weekly - 1 sets - 3 reps - 30 hold - Child's Pose with Sidebending  - 1 x daily - 7 x weekly - 1 sets - 5 reps - 30 hold  ASSESSMENT:  CLINICAL IMPRESSION: Patient is a 20 y.o. female who was seen today for physical therapy evaluation and treatment for low back pain, chronic due to inherent hypermobility in spine.      OBJECTIVE IMPAIRMENTS decreased mobility, difficulty walking, decreased strength, increased fascial restrictions, pain, and hypermobility .   ACTIVITY LIMITATIONS carrying, lifting, bending, sitting, standing, squatting, and locomotion level  PARTICIPATION LIMITATIONS: driving, shopping, community activity, and occupation  PERSONAL FACTORS Past/current experiences and 3+ comorbidities: anxiety/depression, allergies, ADHD and chronic pain , fibromyalgia  are also affecting patient's functional outcome.   REHAB POTENTIAL: Excellent  CLINICAL DECISION MAKING: Evolving/moderate complexity  EVALUATION COMPLEXITY: Moderate   GOALS:  LONG TERM GOALS: Target date: 01/16/2022  Pt will be I with HEP for core, posture and limbs Baseline:  Goal status: INITIAL  2.  Pt will be able to understand concepts of stability and control with functional movements and carry over into work, life  Baseline: needs reinforcement Goal status: INITIAL  3.  FOTO score will increase to 64% or better to demo improved functional mobility  Baseline:  Goal status: INITIAL  4.  Pt will be able to stand for 45 min before pain becomes moderate for improved work performance Baseline: 30 min pain increases to MOD and 60 min Max Goal status: INITIAL  5.  Pt will be able to tolerate lifting and squatting without increased back pain  Baseline:  Goal status: INITIAL  6.  Pt will be able to tolerate PT session and still have reserve for another activity Baseline:  Goal status:  INITIAL   PLAN: PT FREQUENCY: 2x/week  PT DURATION: 6 weeks  PLANNED INTERVENTIONS: Therapeutic exercises, Therapeutic activity, Neuromuscular re-education, Balance training, Gait training, Patient/Family education, Joint mobilization, Dry Needling, Cryotherapy, Moist heat, Taping, Manual therapy, and Re-evaluation.  PLAN FOR NEXT SESSION: HEP, core, endurance and progressive lifting, loading  DN to Rt QL     [name], PT 12/05/21 2:53 PM Phone: [phone] Fax: [phone]  Zahriyah Joo, PT 12/05/2021, 2:49 PM

## 2021-12-05 ENCOUNTER — Ambulatory Visit: Payer: BC Managed Care – PPO | Attending: Family Medicine | Admitting: Physical Therapy

## 2021-12-05 ENCOUNTER — Ambulatory Visit (INDEPENDENT_AMBULATORY_CARE_PROVIDER_SITE_OTHER): Payer: BC Managed Care – PPO

## 2021-12-05 ENCOUNTER — Encounter: Payer: Self-pay | Admitting: Physical Therapy

## 2021-12-05 DIAGNOSIS — J309 Allergic rhinitis, unspecified: Secondary | ICD-10-CM | POA: Diagnosis not present

## 2021-12-05 DIAGNOSIS — M2550 Pain in unspecified joint: Secondary | ICD-10-CM | POA: Diagnosis present

## 2021-12-05 DIAGNOSIS — M7918 Myalgia, other site: Secondary | ICD-10-CM | POA: Diagnosis present

## 2021-12-05 DIAGNOSIS — G8929 Other chronic pain: Secondary | ICD-10-CM | POA: Insufficient documentation

## 2021-12-05 DIAGNOSIS — M6281 Muscle weakness (generalized): Secondary | ICD-10-CM | POA: Diagnosis present

## 2021-12-05 DIAGNOSIS — M545 Low back pain, unspecified: Secondary | ICD-10-CM | POA: Diagnosis present

## 2021-12-08 ENCOUNTER — Encounter: Payer: Self-pay | Admitting: Physical Therapy

## 2021-12-08 ENCOUNTER — Ambulatory Visit: Payer: BC Managed Care – PPO | Admitting: Physical Therapy

## 2021-12-08 DIAGNOSIS — M7918 Myalgia, other site: Secondary | ICD-10-CM | POA: Diagnosis not present

## 2021-12-08 DIAGNOSIS — G8929 Other chronic pain: Secondary | ICD-10-CM

## 2021-12-11 ENCOUNTER — Ambulatory Visit: Payer: BC Managed Care – PPO

## 2021-12-11 DIAGNOSIS — M6281 Muscle weakness (generalized): Secondary | ICD-10-CM

## 2021-12-11 DIAGNOSIS — M2550 Pain in unspecified joint: Secondary | ICD-10-CM

## 2021-12-11 DIAGNOSIS — M7918 Myalgia, other site: Secondary | ICD-10-CM

## 2021-12-11 DIAGNOSIS — G8929 Other chronic pain: Secondary | ICD-10-CM

## 2021-12-13 NOTE — Therapy (Signed)
OUTPATIENT PHYSICAL THERAPY TREATMENT NOTE   Patient Name: Cassandra Schultz MRN: [id number] DOB:2001-01-29, 21 y.o., female Today's Date: 12/14/2021  PCP: Horton Marshall, PA    REFERRING PROVIDER: Teryl Lucy, MD   END OF SESSION:   PT End of Session - 12/14/21 1234     Visit Number 4    Number of Visits 12    Date for PT Re-Evaluation 01/16/22    Authorization Type BCBS    PT Start Time 1233    PT Stop Time 1315    PT Time Calculation (min) 42 min    Activity Tolerance Other (comment)   see impression   Behavior During Therapy WFL for tasks assessed/performed               Past Medical History:  Diagnosis Date   ADHD    Allergy    Anxiety    Depression    reports ADHD    Eczema    Raynaud's syndrome    Tremor of both hands    Past Surgical History:  Procedure Laterality Date   NO PAST SURGERIES     WISDOM TOOTH EXTRACTION     Patient Active Problem List   Diagnosis Date Noted   Pollen-food allergy 08/25/2021   SVT (supraventricular tachycardia) (HCC) 05/09/2021   Palpitations 04/17/2021   Seasonal and perennial allergic rhinitis 09/21/2020   Seasonal allergic conjunctivitis 09/21/2020   Intrinsic atopic dermatitis 09/21/2020   Numbness and tingling of both legs - comes and goes after activity and onset of pain 08/03/2019   Fatigue 05/27/2019   Low back pain 02/24/2019   Bilateral knee pain 02/24/2019   Bilateral hip pain 02/24/2019   Acute bilateral ankle pain 02/24/2019   Healthcare maintenance 12/23/2018   Generalized anxiety disorder 04/03/2018   Attention deficit hyperactivity disorder (ADHD), combined type, severe 04/03/2018   Mild recurrent major depression (HCC) 04/03/2018   Developmental coordination disorder 04/03/2018   Allergy with anaphylaxis due to food 02/02/2016   Allergic rhinitis due to pollen 02/02/2016    REFERRING DIAG: Lumbar radiculopathy, M79.18 (ICD-10-CM) - Myalgia, other site    THERAPY DIAG:  Myalgia, other  site  Chronic bilateral low back pain without sciatica  Generalized joint pain  Muscle weakness (generalized)  Rationale for Evaluation and Treatment Rehabilitation  PERTINENT HISTORY: Hypermobility syndrome    PRECAUTIONS: None  SUBJECTIVE: Patient reports completing HEP twice since last visit. She has been having some Rt-sided LBP since this morning.  PAIN:  Are you having pain? Yes: NPRS scale: 3/10 Pain location:Rt low back  Pain description: "overstretched"  Aggravating factors: bending forward and prolonged positioning  Relieving factors: unknown    OBJECTIVE: (objective measures completed at initial evaluation unless otherwise dated)   DIAGNOSTIC FINDINGS:  None recent avail.    PATIENT SURVEYS:  FOTO 26%   SCREENING FOR RED FLAGS: Bowel or bladder incontinence: No Spinal tumors: No Cauda equina syndrome: No Compression fracture: No Abdominal aneurysm: No   COGNITION:           Overall cognitive status: Within functional limits for tasks assessed                          SENSATION: WFL     POSTURE: rounded shoulders, forward head, and increased lumbar lordosis   PALPATION: Painful with palpation along paraspinals and R quadratus lumborum    LUMBAR ROM:    Active  A/PROM  eval  Flexion WFL knees bent , painful  Extension WFL pain   Right lateral flexion WFL pain   Left lateral flexion WFL pain   Right rotation WFL  Left rotation WFL   (Blank rows = not tested)    Uses hands to rise up from floor from flexion due to pain    LOWER EXTREMITY MMT   Active  Right eval Left eval 12/11/21  Hip flexion 5 5   Hip extension 4- 4-   Hip abduction       Hip adduction       Hip internal rotation       Hip external rotation       Knee flexion 4 4 5/5 bilateral  Knee extension 4+ 4+   Ankle dorsiflexion       Ankle plantarflexion       Ankle inversion       Ankle eversion        (Blank rows = not tested)   LOWER EXTREMITY AROM:   MMT  Right eval Left eval  Hip flexion      Hip extension      Hip abduction      Hip adduction      Hip internal rotation      Hip external rotation      Knee flexion      Knee extension      Ankle dorsiflexion      Ankle plantarflexion      Ankle inversion      Ankle eversion       (Blank rows = not tested)   LUMBAR SPECIAL TESTS:  Slump test: Positive bilateral . Neg SLR    FUNCTIONAL TESTS:  NT   GAIT: Distance walked: NT Assistive device utilized: None Level of assistance:  NT Comments: NT       TODAY'S TREATMENT  [facility] Adult PT Treatment:                                                DATE: 12/14/21 Therapeutic Exercise: Figure 4 LTR 1 minute each  Supine TA march x 20  90/90 march 2 x 10  Bridge with SL knee extension unable due to weakness Hip bridge with abduction black band requested to discontinue secondary to knee pain after 7 reps  Modified 100s 3 x 30 sec  Sidelying hip circles 2 x 10 each CW/CCW  Manual Therapy: STM/DTM Rt QL Rt QL stretching Trigger Point Dry Needling Treatment: Pre-treatment instruction: Patient instructed on dry needling rationale, procedures, and possible side effects including pain during treatment (achy,cramping feeling), bruising, drop of blood, lightheadedness, nausea, sweating. Patient Consent Given: Yes Education handout provided: Yes Muscles treated: Rt QL  Treatment response/outcome: Palpable decrease in muscle tension Post-treatment instructions: Patient instructed to expect possible mild to moderate muscle soreness later today and/or tomorrow. Patient instructed in methods to reduce muscle soreness and to continue prescribed HEP. If patient was dry needled over the lung field, patient was instructed on signs and symptoms of pneumothorax and, however unlikely, to see immediate medical attention should they occur. Patient was also educated on signs and symptoms of infection and to seek medical attention should they occur. Patient  verbalized understanding of these instructions and education.    [facility] Adult PT Treatment:  DATE: 12/11/21 Therapeutic Exercise: Supine pelvic tilts 2 x 10  Supine TA march 2 x 10  Sciatic nerve glides 1 x 10  Clamshells red band 2 x 10  Supine diaphragmatic breathing x 5  90/90 hold; demo Childs pose x 30 sec each QL stretch demo Sit to stand 2 x 5; 10 lbs  Resisted shoulder extension yellow band 2 x 10  Updated HEP    [facility] Adult PT Treatment:                                                DATE: 12/08/21 Therapeutic Exercise: Rec bike L2 x 5 min HR 108 after Squat tap to chair 10 x 2 (10#) , HR 108 Seated on airex pelvic tilting  Seated on airex with alternating LE lifts- HR 108 bpm Hooklying Breathing with ab draw in Hooklying AROM clam with draw in Hooklying ball squeeze with draw in   INITIAL TREATMENT: PT, HEP, stabilization     PATIENT EDUCATION:  Education details:  HEP  Person educated: Patient Education method: Programmer, multimedia, Demonstration, and Handouts Education comprehension: verbalized understanding, returned demonstration, and verbal cues required     HOME EXERCISE PROGRAM: Access Code: [email or web address] URL: [email or web address] Date: 12/11/2021 Prepared by: Letitia Libra  Exercises - Supine with Legs Supported at 90/90  - 2 x daily - 7 x weekly - 1 sets - 1 reps - 30 hold - Supine Sciatic Nerve Glide  - 1 x daily - 7 x weekly - 1 sets - 10 reps - Supine Diaphragmatic Breathing  - 2 x daily - 7 x weekly - 2 sets - 10 reps - 10 hold - Sidelying Quadratus Lumborum Stretch on Table  - 1-2 x daily - 7 x weekly - 1 sets - 3 reps - 30 hold - Child's Pose with Sidebending  - 1 x daily - 7 x weekly - 1 sets - 5 reps - 30 hold - Supine Posterior Pelvic Tilt  - 1 x daily - 7 x weekly - 2 sets - 10 reps - 5 sec  hold - Clamshell with Resistance  - 1 x daily - 7 x weekly - 2 sets - 10 reps - Supine March  - 1 x  daily - 7 x weekly - 2 sets - 10 reps   ASSESSMENT:   CLINICAL IMPRESSION: Patient with poor tolerance to TPDN to Rt QL requesting to discontinue secondary to discomfort. She was noted to have a decrease in muscle tautness following TPDN, though reported significant soreness immediately post-intervention that was slightly reduced with STM and stretching of the QL. Able to progress core stabilization with patient having difficulty maintaining TA activation with dynamic core stabilization. Overall good tolerance to strengthening with exception of bridging as she requested to discontinue secondary to knee pain. She declined use of modalities for muscle soreness at end of session, reporting that soreness had decreased.      OBJECTIVE IMPAIRMENTS decreased mobility, difficulty walking, decreased strength, increased fascial restrictions, pain, and hypermobility .    ACTIVITY LIMITATIONS carrying, lifting, bending, sitting, standing, squatting, and locomotion level   PARTICIPATION LIMITATIONS: driving, shopping, community activity, and occupation   PERSONAL FACTORS Past/current experiences and 3+ comorbidities: anxiety/depression, allergies, ADHD and chronic pain , fibromyalgia  are also affecting patient's functional outcome.    REHAB POTENTIAL: Excellent   CLINICAL DECISION MAKING: Evolving/moderate  complexity   EVALUATION COMPLEXITY: Moderate     GOALS:   LONG TERM GOALS: Target date: 01/16/2022   Pt will be I with HEP for core, posture and limbs Baseline:  Goal status: INITIAL   2.  Pt will be able to understand concepts of stability and control with functional movements and carry over into work, life  Baseline: needs reinforcement Goal status: INITIAL   3.  FOTO score will increase to 64% or better to demo improved functional mobility  Baseline:  Goal status: INITIAL   4.  Pt will be able to stand for 45 min before pain becomes moderate for improved work performance Baseline: 30  min pain increases to MOD and 60 min Max Goal status: INITIAL   5.  Pt will be able to tolerate lifting and squatting without increased back pain  Baseline:  Goal status: INITIAL   6.  Pt will be able to tolerate PT session and still have reserve for another activity Baseline:  Goal status: INITIAL     PLAN: PT FREQUENCY: 2x/week   PT DURATION: 6 weeks   PLANNED INTERVENTIONS: Therapeutic exercises, Therapeutic activity, Neuromuscular re-education, Balance training, Gait training, Patient/Family education, Joint mobilization, Dry Needling, Cryotherapy, Moist heat, Taping, Manual therapy, and Re-evaluation.   PLAN FOR NEXT SESSION:  core, endurance and progressive lifting, loading        Letitia Libra, PT, DPT, ATC 12/14/21 1:18 PM

## 2021-12-14 ENCOUNTER — Ambulatory Visit: Payer: BC Managed Care – PPO

## 2021-12-14 DIAGNOSIS — M7918 Myalgia, other site: Secondary | ICD-10-CM | POA: Diagnosis not present

## 2021-12-14 DIAGNOSIS — M545 Low back pain, unspecified: Secondary | ICD-10-CM

## 2021-12-14 DIAGNOSIS — M2550 Pain in unspecified joint: Secondary | ICD-10-CM

## 2021-12-14 DIAGNOSIS — M6281 Muscle weakness (generalized): Secondary | ICD-10-CM

## 2021-12-14 NOTE — Patient Instructions (Signed)

## 2021-12-18 ENCOUNTER — Ambulatory Visit: Payer: BC Managed Care – PPO | Attending: Family Medicine

## 2021-12-18 ENCOUNTER — Telehealth: Payer: Self-pay

## 2021-12-18 DIAGNOSIS — M7918 Myalgia, other site: Secondary | ICD-10-CM | POA: Insufficient documentation

## 2021-12-18 DIAGNOSIS — M6281 Muscle weakness (generalized): Secondary | ICD-10-CM | POA: Insufficient documentation

## 2021-12-18 DIAGNOSIS — M2550 Pain in unspecified joint: Secondary | ICD-10-CM | POA: Insufficient documentation

## 2021-12-18 DIAGNOSIS — M545 Low back pain, unspecified: Secondary | ICD-10-CM | POA: Insufficient documentation

## 2021-12-18 DIAGNOSIS — G8929 Other chronic pain: Secondary | ICD-10-CM | POA: Insufficient documentation

## 2021-12-18 NOTE — Telephone Encounter (Signed)
Spoke with patient regarding missed PT appointment. Patient overslept and confirmed next scheduled appointment.   Letitia Libra, PT, DPT, ATC 12/18/21 2:48 PM

## 2021-12-21 ENCOUNTER — Ambulatory Visit: Payer: BC Managed Care – PPO

## 2021-12-21 ENCOUNTER — Ambulatory Visit (INDEPENDENT_AMBULATORY_CARE_PROVIDER_SITE_OTHER): Payer: BC Managed Care – PPO

## 2021-12-21 DIAGNOSIS — J309 Allergic rhinitis, unspecified: Secondary | ICD-10-CM

## 2021-12-21 DIAGNOSIS — M6281 Muscle weakness (generalized): Secondary | ICD-10-CM | POA: Diagnosis present

## 2021-12-21 DIAGNOSIS — M2550 Pain in unspecified joint: Secondary | ICD-10-CM | POA: Diagnosis present

## 2021-12-21 DIAGNOSIS — M545 Low back pain, unspecified: Secondary | ICD-10-CM

## 2021-12-21 DIAGNOSIS — M7918 Myalgia, other site: Secondary | ICD-10-CM

## 2021-12-21 DIAGNOSIS — G8929 Other chronic pain: Secondary | ICD-10-CM | POA: Diagnosis present

## 2021-12-21 NOTE — Therapy (Signed)
OUTPATIENT PHYSICAL THERAPY TREATMENT NOTE   Patient Name: Cassandra Schultz MRN: [place]:[id number] DOB:Oct 10, 2000, 21 y.o., female Today's Date: 12/21/2021  PCP: Marilynne Drivers, [place]    REFERRING PROVIDER: Marchia Bond, MD   END OF SESSION:   PT End of Session - 12/21/21 1101     Visit Number 5    Number of Visits 12    Date for PT Re-Evaluation 01/16/22    Authorization Type BCBS    PT Start Time 1100    PT Stop Time 1143    PT Time Calculation (min) 43 min    Activity Tolerance Patient tolerated treatment well    Behavior During Therapy WFL for tasks assessed/performed                Past Medical History:  Diagnosis Date   ADHD    Allergy    Anxiety    Depression    reports ADHD    Eczema    Raynaud's syndrome    Tremor of both hands    Past Surgical History:  Procedure Laterality Date   NO PAST SURGERIES     WISDOM TOOTH EXTRACTION     Patient Active Problem List   Diagnosis Date Noted   Pollen-food allergy 08/25/2021   SVT (supraventricular tachycardia) ([place]) 05/09/2021   Palpitations 04/17/2021   Seasonal and perennial allergic rhinitis 09/21/2020   Seasonal allergic conjunctivitis 09/21/2020   Intrinsic atopic dermatitis 09/21/2020   Numbness and tingling of both legs - comes and goes after activity and onset of pain 08/03/2019   Fatigue 05/27/2019   Low back pain 02/24/2019   Bilateral knee pain 02/24/2019   Bilateral hip pain 02/24/2019   Acute bilateral ankle pain 02/24/2019   Healthcare maintenance 12/23/2018   Generalized anxiety disorder 04/03/2018   Attention deficit hyperactivity disorder (ADHD), combined type, severe 04/03/2018   Mild recurrent major depression ([place]) 04/03/2018   Developmental coordination disorder 04/03/2018   Allergy with anaphylaxis due to food 02/02/2016   Allergic rhinitis due to pollen 02/02/2016    REFERRING DIAG: Lumbar radiculopathy, M79.18 (ICD-10-CM) - Myalgia, other site    THERAPY DIAG:  Myalgia, other  site  Chronic bilateral low back pain without sciatica  Generalized joint pain  Muscle weakness (generalized)  Rationale for Evaluation and Treatment Rehabilitation  PERTINENT HISTORY: Hypermobility syndrome    PRECAUTIONS: None  SUBJECTIVE: Patient reports her back feels better, but she is having some Rt quad pain. She reports that her heart rate has been elevated, likely due to being out of her heart medication that she can't get refilled until Friday. In standing her heart rate seems to elevate more, noticing that it increased to 150 when standing at work yesterday.   PAIN:  Are you having pain? Yes: NPRS scale: 2/10 Pain location:Rt quad Pain description: dull  Aggravating factors: walking Relieving factors: rest    OBJECTIVE: (objective measures completed at initial evaluation unless otherwise dated)   DIAGNOSTIC FINDINGS:  None recent avail.    PATIENT SURVEYS:  FOTO 26%   SCREENING FOR RED FLAGS: Bowel or bladder incontinence: No Spinal tumors: No Cauda equina syndrome: No Compression fracture: No Abdominal aneurysm: No   COGNITION:           Overall cognitive status: Within functional limits for tasks assessed                          SENSATION: WFL     POSTURE: rounded shoulders, forward head, and  increased lumbar lordosis   PALPATION: Painful with palpation along paraspinals and R quadratus lumborum    LUMBAR ROM:    Active  A/PROM  eval 12/21/21  Flexion WFL knees bent , painful WFL; pain free   Extension WFL pain    Right lateral flexion WFL pain    Left lateral flexion WFL pain    Right rotation WFL   Left rotation WFL    (Blank rows = not tested)    Uses hands to rise up from floor from flexion due to pain    LOWER EXTREMITY MMT   Active  Right eval Left eval 12/11/21  Hip flexion 5 5   Hip extension 4- 4-   Hip abduction       Hip adduction       Hip internal rotation       Hip external rotation       Knee flexion 4 4 5/5  bilateral  Knee extension 4+ 4+   Ankle dorsiflexion       Ankle plantarflexion       Ankle inversion       Ankle eversion        (Blank rows = not tested)   LOWER EXTREMITY AROM:   MMT Right eval Left eval  Hip flexion      Hip extension      Hip abduction      Hip adduction      Hip internal rotation      Hip external rotation      Knee flexion      Knee extension      Ankle dorsiflexion      Ankle plantarflexion      Ankle inversion      Ankle eversion       (Blank rows = not tested)   LUMBAR SPECIAL TESTS:  Slump test: Positive bilateral . Neg SLR    FUNCTIONAL TESTS:  NT   GAIT: Distance walked: NT Assistive device utilized: None Level of assistance:  NT Comments: NT       TODAY'S TREATMENT  [facility] Adult PT Treatment:                                                DATE: 12/21/21 Therapeutic Exercise: Recumbent bike x 5 minutes level 3; HR max 122  Sidelying leg taps 2 x 10; HR max 148 Side planks 2 x 30 sec; HR max 171 Fire hydrant 2 x 10 HR max 172 Updated HEP    [facility] Adult PT Treatment:                                                DATE: 12/14/21 Therapeutic Exercise: Figure 4 LTR 1 minute each  Supine TA march x 20  90/90 march 2 x 10  Bridge with SL knee extension unable due to weakness Hip bridge with abduction black band requested to discontinue secondary to knee pain after 7 reps  Modified 100s 3 x 30 sec  Sidelying hip circles 2 x 10 each CW/CCW  Manual Therapy: STM/DTM Rt QL Rt QL stretching Trigger Point Dry Needling Treatment: Pre-treatment instruction: Patient instructed on dry needling rationale, procedures, and possible side effects including pain  during treatment (achy,cramping feeling), bruising, drop of blood, lightheadedness, nausea, sweating. Patient Consent Given: Yes Education handout provided: Yes Muscles treated: Rt QL  Treatment response/outcome: Palpable decrease in muscle tension Post-treatment instructions: Patient  instructed to expect possible mild to moderate muscle soreness later today and/or tomorrow. Patient instructed in methods to reduce muscle soreness and to continue prescribed HEP. If patient was dry needled over the lung field, patient was instructed on signs and symptoms of pneumothorax and, however unlikely, to see immediate medical attention should they occur. Patient was also educated on signs and symptoms of infection and to seek medical attention should they occur. Patient verbalized understanding of these instructions and education.    [facility] Adult PT Treatment:                                                DATE: 12/11/21 Therapeutic Exercise: Supine pelvic tilts 2 x 10  Supine TA march 2 x 10  Sciatic nerve glides 1 x 10  Clamshells red band 2 x 10  Supine diaphragmatic breathing x 5  90/90 hold; demo Childs pose x 30 sec each QL stretch demo Sit to stand 2 x 5; 10 lbs  Resisted shoulder extension yellow band 2 x 10  Updated HEP         PATIENT EDUCATION:  Education details:  HEP  Person educated: Patient Education method: Programmer, multimedia, Facilities manager, and Handouts Education comprehension: verbalized understanding, returned demonstration, and verbal cues required     HOME EXERCISE PROGRAM: Access Code: [email or web address] URL: [email or web address] Date: 12/11/2021 Prepared by: Letitia Libra  Exercises - Supine with Legs Supported at 90/90  - 2 x daily - 7 x weekly - 1 sets - 1 reps - 30 hold - Supine Sciatic Nerve Glide  - 1 x daily - 7 x weekly - 1 sets - 10 reps - Supine Diaphragmatic Breathing  - 2 x daily - 7 x weekly - 2 sets - 10 reps - 10 hold - Sidelying Quadratus Lumborum Stretch on Table  - 1-2 x daily - 7 x weekly - 1 sets - 3 reps - 30 hold - Child's Pose with Sidebending  - 1 x daily - 7 x weekly - 1 sets - 5 reps - 30 hold - Supine Posterior Pelvic Tilt  - 1 x daily - 7 x weekly - 2 sets - 10 reps - 5 sec  hold - Clamshell with Resistance  - 1 x daily  - 7 x weekly - 2 sets - 10 reps - Supine March  - 1 x daily - 7 x weekly - 2 sets - 10 reps   ASSESSMENT:   CLINICAL IMPRESSION: Patient tolerated session well today focusing on continued progression of lumbopelvic strengthening. Limited standing activity secondary to patient being out of her heart medication and per her subjective reports standing causes a greater elevation in HR. HR elevated with all ther ex (see above in interventions for specific measurements) so frequent rest breaks were utilized to reduce to baseline. Occasional dizziness with ther ex that subsided with rest, otherwise no issues reported with strengthening.      OBJECTIVE IMPAIRMENTS decreased mobility, difficulty walking, decreased strength, increased fascial restrictions, pain, and hypermobility .    ACTIVITY LIMITATIONS carrying, lifting, bending, sitting, standing, squatting, and locomotion level   PARTICIPATION LIMITATIONS: driving, shopping, community activity, and occupation  PERSONAL FACTORS Past/current experiences and 3+ comorbidities: anxiety/depression, allergies, ADHD and chronic pain , fibromyalgia  are also affecting patient's functional outcome.    REHAB POTENTIAL: Excellent   CLINICAL DECISION MAKING: Evolving/moderate complexity   EVALUATION COMPLEXITY: Moderate     GOALS:   LONG TERM GOALS: Target date: 01/16/2022   Pt will be I with HEP for core, posture and limbs Baseline:  Goal status: ACHIEVED   2.  Pt will be able to understand concepts of stability and control with functional movements and carry over into work, life  Baseline: needs reinforcement Goal status: INITIAL   3.  FOTO score will increase to 64% or better to demo improved functional mobility  Baseline:  Goal status: INITIAL   4.  Pt will be able to stand for 45 min before pain becomes moderate for improved work performance Baseline: 30 min pain increases to MOD and 60 min Max Goal status: INITIAL   5.  Pt will be able  to tolerate lifting and squatting without increased back pain  Baseline:  Goal status: INITIAL   6.  Pt will be able to tolerate PT session and still have reserve for another activity Baseline:  Goal status: INITIAL     PLAN: PT FREQUENCY: 2x/week   PT DURATION: 6 weeks   PLANNED INTERVENTIONS: Therapeutic exercises, Therapeutic activity, Neuromuscular re-education, Balance training, Gait training, Patient/Family education, Joint mobilization, Dry Needling, Cryotherapy, Moist heat, Taping, Manual therapy, and Re-evaluation.   PLAN FOR NEXT SESSION:  core, endurance and progressive lifting, loading        Gwendolyn Grant, PT, DPT, ATC 12/21/21 11:45 AM

## 2021-12-24 NOTE — Therapy (Unsigned)
OUTPATIENT PHYSICAL THERAPY TREATMENT NOTE   Patient Name: Cassandra Schultz MRN: [id number] DOB:September 22, 2000, 21 y.o., female Today's Date: 12/24/2021  PCP: Horton Marshall, PA    REFERRING PROVIDER: Teryl Lucy, MD   END OF SESSION:        Past Medical History:  Diagnosis Date   ADHD    Allergy    Anxiety    Depression    reports ADHD    Eczema    Raynaud's syndrome    Tremor of both hands    Past Surgical History:  Procedure Laterality Date   NO PAST SURGERIES     WISDOM TOOTH EXTRACTION     Patient Active Problem List   Diagnosis Date Noted   Pollen-food allergy 08/25/2021   SVT (supraventricular tachycardia) (HCC) 05/09/2021   Palpitations 04/17/2021   Seasonal and perennial allergic rhinitis 09/21/2020   Seasonal allergic conjunctivitis 09/21/2020   Intrinsic atopic dermatitis 09/21/2020   Numbness and tingling of both legs - comes and goes after activity and onset of pain 08/03/2019   Fatigue 05/27/2019   Low back pain 02/24/2019   Bilateral knee pain 02/24/2019   Bilateral hip pain 02/24/2019   Acute bilateral ankle pain 02/24/2019   Healthcare maintenance 12/23/2018   Generalized anxiety disorder 04/03/2018   Attention deficit hyperactivity disorder (ADHD), combined type, severe 04/03/2018   Mild recurrent major depression (HCC) 04/03/2018   Developmental coordination disorder 04/03/2018   Allergy with anaphylaxis due to food 02/02/2016   Allergic rhinitis due to pollen 02/02/2016    REFERRING DIAG: Lumbar radiculopathy, M79.18 (ICD-10-CM) - Myalgia, other site    THERAPY DIAG:  No diagnosis found.  Rationale for Evaluation and Treatment Rehabilitation  PERTINENT HISTORY: Hypermobility syndrome    PRECAUTIONS: None  SUBJECTIVE: Patient reports her back feels better, but she is having some Rt quad pain. She reports that her heart rate has been elevated, likely due to being out of her heart medication that she can't get refilled until Friday. In  standing her heart rate seems to elevate more, noticing that it increased to 150 when standing at work yesterday.   PAIN:  Are you having pain? Yes: NPRS scale: 2/10 Pain location:Rt quad Pain description: dull  Aggravating factors: walking Relieving factors: rest    OBJECTIVE: (objective measures completed at initial evaluation unless otherwise dated)   DIAGNOSTIC FINDINGS:  None recent avail.    PATIENT SURVEYS:  FOTO 26%   SCREENING FOR RED FLAGS: Bowel or bladder incontinence: No Spinal tumors: No Cauda equina syndrome: No Compression fracture: No Abdominal aneurysm: No   COGNITION:           Overall cognitive status: Within functional limits for tasks assessed                          SENSATION: WFL     POSTURE: rounded shoulders, forward head, and increased lumbar lordosis   PALPATION: Painful with palpation along paraspinals and R quadratus lumborum    LUMBAR ROM:    Active  A/PROM  eval 12/21/21  Flexion WFL knees bent , painful WFL; pain free   Extension WFL pain    Right lateral flexion WFL pain    Left lateral flexion WFL pain    Right rotation WFL   Left rotation WFL    (Blank rows = not tested)    Uses hands to rise up from floor from flexion due to pain    LOWER EXTREMITY MMT   Active  Right eval Left eval 12/11/21  Hip flexion 5 5   Hip extension 4- 4-   Hip abduction       Hip adduction       Hip internal rotation       Hip external rotation       Knee flexion 4 4 5/5 bilateral  Knee extension 4+ 4+   Ankle dorsiflexion       Ankle plantarflexion       Ankle inversion       Ankle eversion        (Blank rows = not tested)   LOWER EXTREMITY AROM:   MMT Right eval Left eval  Hip flexion      Hip extension      Hip abduction      Hip adduction      Hip internal rotation      Hip external rotation      Knee flexion      Knee extension      Ankle dorsiflexion      Ankle plantarflexion      Ankle inversion      Ankle eversion        (Blank rows = not tested)   LUMBAR SPECIAL TESTS:  Slump test: Positive bilateral . Neg SLR    FUNCTIONAL TESTS:  NT   GAIT: Distance walked: NT Assistive device utilized: None Level of assistance:  NT Comments: NT       TODAY'S TREATMENT    [facility] Adult PT Treatment:                                                DATE: 12/25/21 Therapeutic Exercise: *** Manual Therapy: *** Neuromuscular re-ed: *** Therapeutic Activity: *** Modalities: *** Self Care: ***  [facility] Adult PT Treatment:                                                DATE: 12/21/21 Therapeutic Exercise: Recumbent bike x 5 minutes level 3; HR max 122  Sidelying leg taps 2 x 10; HR max 148 Side planks 2 x 30 sec; HR max 171 Fire hydrant 2 x 10 HR max 172 Updated HEP    [facility] Adult PT Treatment:                                                DATE: 12/14/21 Therapeutic Exercise: Figure 4 LTR 1 minute each  Supine TA march x 20  90/90 march 2 x 10  Bridge with SL knee extension unable due to weakness Hip bridge with abduction black band requested to discontinue secondary to knee pain after 7 reps  Modified 100s 3 x 30 sec  Sidelying hip circles 2 x 10 each CW/CCW  Manual Therapy: STM/DTM Rt QL Rt QL stretching Trigger Point Dry Needling Treatment: Pre-treatment instruction: Patient instructed on dry needling rationale, procedures, and possible side effects including pain during treatment (achy,cramping feeling), bruising, drop of blood, lightheadedness, nausea, sweating. Patient Consent Given: Yes Education handout provided: Yes Muscles treated: Rt QL  Treatment response/outcome: Palpable decrease in muscle tension  Post-treatment instructions: Patient instructed to expect possible mild to moderate muscle soreness later today and/or tomorrow. Patient instructed in methods to reduce muscle soreness and to continue prescribed HEP. If patient was dry needled over the lung field, patient was instructed on  signs and symptoms of pneumothorax and, however unlikely, to see immediate medical attention should they occur. Patient was also educated on signs and symptoms of infection and to seek medical attention should they occur. Patient verbalized understanding of these instructions and education.    [facility] Adult PT Treatment:                                                DATE: 12/11/21 Therapeutic Exercise: Supine pelvic tilts 2 x 10  Supine TA march 2 x 10  Sciatic nerve glides 1 x 10  Clamshells red band 2 x 10  Supine diaphragmatic breathing x 5  90/90 hold; demo Childs pose x 30 sec each QL stretch demo Sit to stand 2 x 5; 10 lbs  Resisted shoulder extension yellow band 2 x 10  Updated HEP         PATIENT EDUCATION:  Education details:  HEP  Person educated: Patient Education method: Programmer, multimedia, Facilities manager, and Handouts Education comprehension: verbalized understanding, returned demonstration, and verbal cues required     HOME EXERCISE PROGRAM: Access Code: [email or web address] URL: [email or web address] Date: 12/11/2021 Prepared by: Letitia Libra  Exercises - Supine with Legs Supported at 90/90  - 2 x daily - 7 x weekly - 1 sets - 1 reps - 30 hold - Supine Sciatic Nerve Glide  - 1 x daily - 7 x weekly - 1 sets - 10 reps - Supine Diaphragmatic Breathing  - 2 x daily - 7 x weekly - 2 sets - 10 reps - 10 hold - Sidelying Quadratus Lumborum Stretch on Table  - 1-2 x daily - 7 x weekly - 1 sets - 3 reps - 30 hold - Child's Pose with Sidebending  - 1 x daily - 7 x weekly - 1 sets - 5 reps - 30 hold - Supine Posterior Pelvic Tilt  - 1 x daily - 7 x weekly - 2 sets - 10 reps - 5 sec  hold - Clamshell with Resistance  - 1 x daily - 7 x weekly - 2 sets - 10 reps - Supine March  - 1 x daily - 7 x weekly - 2 sets - 10 reps   ASSESSMENT:   CLINICAL IMPRESSION: Patient tolerated session well today focusing on continued progression of lumbopelvic strengthening. Limited standing  activity secondary to patient being out of her heart medication and per her subjective reports standing causes a greater elevation in HR. HR elevated with all ther ex (see above in interventions for specific measurements) so frequent rest breaks were utilized to reduce to baseline. Occasional dizziness with ther ex that subsided with rest, otherwise no issues reported with strengthening.      OBJECTIVE IMPAIRMENTS decreased mobility, difficulty walking, decreased strength, increased fascial restrictions, pain, and hypermobility .    ACTIVITY LIMITATIONS carrying, lifting, bending, sitting, standing, squatting, and locomotion level   PARTICIPATION LIMITATIONS: driving, shopping, community activity, and occupation   PERSONAL FACTORS Past/current experiences and 3+ comorbidities: anxiety/depression, allergies, ADHD and chronic pain , fibromyalgia  are also affecting patient's functional outcome.    REHAB POTENTIAL: Excellent   CLINICAL  DECISION MAKING: Evolving/moderate complexity   EVALUATION COMPLEXITY: Moderate     GOALS:   LONG TERM GOALS: Target date: 01/16/2022   Pt will be I with HEP for core, posture and limbs Baseline:  Goal status: ACHIEVED   2.  Pt will be able to understand concepts of stability and control with functional movements and carry over into work, life  Baseline: needs reinforcement Goal status: INITIAL   3.  FOTO score will increase to 64% or better to demo improved functional mobility  Baseline:  Goal status: INITIAL   4.  Pt will be able to stand for 45 min before pain becomes moderate for improved work performance Baseline: 30 min pain increases to MOD and 60 min Max Goal status: INITIAL   5.  Pt will be able to tolerate lifting and squatting without increased back pain  Baseline:  Goal status: INITIAL   6.  Pt will be able to tolerate PT session and still have reserve for another activity Baseline:  Goal status: INITIAL     PLAN: PT FREQUENCY:  2x/week   PT DURATION: 6 weeks   PLANNED INTERVENTIONS: Therapeutic exercises, Therapeutic activity, Neuromuscular re-education, Balance training, Gait training, Patient/Family education, Joint mobilization, Dry Needling, Cryotherapy, Moist heat, Taping, Manual therapy, and Re-evaluation.   PLAN FOR NEXT SESSION:  core, endurance and progressive lifting, loading        Letitia Libra, PT, DPT, ATC 12/24/21 8:05 PM

## 2021-12-25 ENCOUNTER — Ambulatory Visit: Payer: BC Managed Care – PPO

## 2021-12-25 ENCOUNTER — Ambulatory Visit (INDEPENDENT_AMBULATORY_CARE_PROVIDER_SITE_OTHER): Payer: BC Managed Care – PPO | Admitting: *Deleted

## 2021-12-25 DIAGNOSIS — J309 Allergic rhinitis, unspecified: Secondary | ICD-10-CM

## 2021-12-25 DIAGNOSIS — M545 Low back pain, unspecified: Secondary | ICD-10-CM

## 2021-12-25 DIAGNOSIS — M6281 Muscle weakness (generalized): Secondary | ICD-10-CM

## 2021-12-25 DIAGNOSIS — M2550 Pain in unspecified joint: Secondary | ICD-10-CM

## 2021-12-25 DIAGNOSIS — M7918 Myalgia, other site: Secondary | ICD-10-CM | POA: Diagnosis not present

## 2021-12-25 NOTE — Therapy (Signed)
OUTPATIENT PHYSICAL THERAPY TREATMENT NOTE   Patient Name: Cassandra Schultz MRN: [id number] DOB:29-Oct-2000, 21 y.o., female Today's Date: 12/25/2021  PCP: Horton Marshall, PA    REFERRING PROVIDER: Teryl Lucy, MD   END OF SESSION:   PT End of Session - 12/25/21 1059     Visit Number 6    Number of Visits 12    Date for PT Re-Evaluation 01/16/22    Authorization Type [employer]    PT Start Time 1100    PT Stop Time 1142    PT Time Calculation (min) 42 min    Activity Tolerance Patient tolerated treatment well    Behavior During Therapy WFL for tasks assessed/performed                Past Medical History:  Diagnosis Date   ADHD    Allergy    Anxiety    Depression    reports ADHD    Eczema    Raynaud's syndrome    Tremor of both hands    Past Surgical History:  Procedure Laterality Date   NO PAST SURGERIES     WISDOM TOOTH EXTRACTION     Patient Active Problem List   Diagnosis Date Noted   Pollen-food allergy 08/25/2021   SVT (supraventricular tachycardia) (HCC) 05/09/2021   Palpitations 04/17/2021   Seasonal and perennial allergic rhinitis 09/21/2020   Seasonal allergic conjunctivitis 09/21/2020   Intrinsic atopic dermatitis 09/21/2020   Numbness and tingling of both legs - comes and goes after activity and onset of pain 08/03/2019   Fatigue 05/27/2019   Low back pain 02/24/2019   Bilateral knee pain 02/24/2019   Bilateral hip pain 02/24/2019   Acute bilateral ankle pain 02/24/2019   Healthcare maintenance 12/23/2018   Generalized anxiety disorder 04/03/2018   Attention deficit hyperactivity disorder (ADHD), combined type, severe 04/03/2018   Mild recurrent major depression (HCC) 04/03/2018   Developmental coordination disorder 04/03/2018   Allergy with anaphylaxis due to food 02/02/2016   Allergic rhinitis due to pollen 02/02/2016    REFERRING DIAG: Lumbar radiculopathy, M79.18 (ICD-10-CM) - Myalgia, other site    THERAPY DIAG:  Myalgia, other  site  Chronic bilateral low back pain without sciatica  Generalized joint pain  Muscle weakness (generalized)  Rationale for Evaluation and Treatment Rehabilitation  PERTINENT HISTORY: Hypermobility syndrome    PRECAUTIONS: None  SUBJECTIVE: Patient reports she is doing ok today without current back pain. She is picking up her heart medication later today.  PAIN:  Are you having pain? No at worst: NPRS scale: 9/10 Pain location:low back Pain description: dull; ache Aggravating factors: unknown Relieving factors: rest    OBJECTIVE: (objective measures completed at initial evaluation unless otherwise dated)   DIAGNOSTIC FINDINGS:  None recent avail.    PATIENT SURVEYS:  FOTO 26% 12/25/21: 69%    SCREENING FOR RED FLAGS: Bowel or bladder incontinence: No Spinal tumors: No Cauda equina syndrome: No Compression fracture: No Abdominal aneurysm: No   COGNITION:           Overall cognitive status: Within functional limits for tasks assessed                          SENSATION: WFL     POSTURE: rounded shoulders, forward head, and increased lumbar lordosis   PALPATION: Painful with palpation along paraspinals and R quadratus lumborum    LUMBAR ROM:    Active  A/PROM  eval 12/21/21  Flexion WFL knees bent ,  painful WFL; pain free   Extension WFL pain    Right lateral flexion WFL pain    Left lateral flexion WFL pain    Right rotation WFL   Left rotation WFL    (Blank rows = not tested)    Uses hands to rise up from floor from flexion due to pain    LOWER EXTREMITY MMT   Active  Right eval Left eval 12/11/21  Hip flexion 5 5   Hip extension 4- 4-   Hip abduction       Hip adduction       Hip internal rotation       Hip external rotation       Knee flexion 4 4 5/5 bilateral  Knee extension 4+ 4+   Ankle dorsiflexion       Ankle plantarflexion       Ankle inversion       Ankle eversion        (Blank rows = not tested)   LOWER EXTREMITY AROM:   MMT  Right eval Left eval  Hip flexion      Hip extension      Hip abduction      Hip adduction      Hip internal rotation      Hip external rotation      Knee flexion      Knee extension      Ankle dorsiflexion      Ankle plantarflexion      Ankle inversion      Ankle eversion       (Blank rows = not tested)   LUMBAR SPECIAL TESTS:  Slump test: Positive bilateral . Neg SLR    FUNCTIONAL TESTS:  NT   GAIT: Distance walked: NT Assistive device utilized: None Level of assistance:  NT Comments: NT       TODAY'S TREATMENT  [facility] Adult PT Treatment:                                                DATE: 12/25/21 Therapeutic Exercise: pallof press 2 x 10; green band recumbent bike x 5 minutes level 3 squat to table 2 x 10 @ 10 lbs  Runner's step up 2 x 10 @ 8 inch with overhead press 4lbs Lateral band walks blue band 4 sets d/b x 10 ft  Dead bug 1 x 10     [facility] Adult PT Treatment:                                                DATE: 12/21/21 Therapeutic Exercise: Recumbent bike x 5 minutes level 3; HR max 122  Sidelying leg taps 2 x 10; HR max 148 Side planks 2 x 30 sec; HR max 171 Fire hydrant 2 x 10 HR max 172 Updated HEP    [facility] Adult PT Treatment:                                                DATE: 12/14/21 Therapeutic Exercise: Figure 4 LTR 1 minute each  Supine TA march x  20  90/90 march 2 x 10  Bridge with SL knee extension unable due to weakness Hip bridge with abduction black band requested to discontinue secondary to knee pain after 7 reps  Modified 100s 3 x 30 sec  Sidelying hip circles 2 x 10 each CW/CCW  Manual Therapy: STM/DTM Rt QL Rt QL stretching Trigger Point Dry Needling Treatment: Pre-treatment instruction: Patient instructed on dry needling rationale, procedures, and possible side effects including pain during treatment (achy,cramping feeling), bruising, drop of blood, lightheadedness, nausea, sweating. Patient Consent Given: Yes Education  handout provided: Yes Muscles treated: Rt QL  Treatment response/outcome: Palpable decrease in muscle tension Post-treatment instructions: Patient instructed to expect possible mild to moderate muscle soreness later today and/or tomorrow. Patient instructed in methods to reduce muscle soreness and to continue prescribed HEP. If patient was dry needled over the lung field, patient was instructed on signs and symptoms of pneumothorax and, however unlikely, to see immediate medical attention should they occur. Patient was also educated on signs and symptoms of infection and to seek medical attention should they occur. Patient verbalized understanding of these instructions and education.          PATIENT EDUCATION:  Education details:  FOTO Person educated: Patient Education method: Explanation Education comprehension: verbalized understanding     HOME EXERCISE PROGRAM: Access Code: [email or web address] URL: [email or web address] Date: 12/11/2021 Prepared by: Letitia Libra  Exercises - Supine with Legs Supported at 90/90  - 2 x daily - 7 x weekly - 1 sets - 1 reps - 30 hold - Supine Sciatic Nerve Glide  - 1 x daily - 7 x weekly - 1 sets - 10 reps - Supine Diaphragmatic Breathing  - 2 x daily - 7 x weekly - 2 sets - 10 reps - 10 hold - Sidelying Quadratus Lumborum Stretch on Table  - 1-2 x daily - 7 x weekly - 1 sets - 3 reps - 30 hold - Child's Pose with Sidebending  - 1 x daily - 7 x weekly - 1 sets - 5 reps - 30 hold - Supine Posterior Pelvic Tilt  - 1 x daily - 7 x weekly - 2 sets - 10 reps - 5 sec  hold - Clamshell with Resistance  - 1 x daily - 7 x weekly - 2 sets - 10 reps - Supine March  - 1 x daily - 7 x weekly - 2 sets - 10 reps   ASSESSMENT:   CLINICAL IMPRESSION: Patient tolerated session well today with continued progression of core strengthening and introduction to standing activity. She demonstrates improved core activation with all ther ex though reports mild low  back discomfort at the end of standing activity and dead bug. Her FOTO score has significantly improved compared to baseline, surpassing the predicted outcome.      OBJECTIVE IMPAIRMENTS decreased mobility, difficulty walking, decreased strength, increased fascial restrictions, pain, and hypermobility .    ACTIVITY LIMITATIONS carrying, lifting, bending, sitting, standing, squatting, and locomotion level   PARTICIPATION LIMITATIONS: driving, shopping, community activity, and occupation   PERSONAL FACTORS Past/current experiences and 3+ comorbidities: anxiety/depression, allergies, ADHD and chronic pain , fibromyalgia  are also affecting patient's functional outcome.    REHAB POTENTIAL: Excellent   CLINICAL DECISION MAKING: Evolving/moderate complexity   EVALUATION COMPLEXITY: Moderate     GOALS:   LONG TERM GOALS: Target date: 01/16/2022   Pt will be I with HEP for core, posture and limbs Baseline:  Goal status: ACHIEVED   2.  Pt  will be able to understand concepts of stability and control with functional movements and carry over into work, life  Baseline: needs reinforcement Goal status: INITIAL   3.  FOTO score will increase to 64% or better to demo improved functional mobility  Baseline:  Goal status: achieved   4.  Pt will be able to stand for 45 min before pain becomes moderate for improved work performance Baseline: 30 min pain increases to MOD and 60 min Max Goal status: INITIAL   5.  Pt will be able to tolerate lifting and squatting without increased back pain  Baseline:  Goal status: INITIAL   6.  Pt will be able to tolerate PT session and still have reserve for another activity Baseline:  Goal status: INITIAL     PLAN: PT FREQUENCY: 2x/week   PT DURATION: 6 weeks   PLANNED INTERVENTIONS: Therapeutic exercises, Therapeutic activity, Neuromuscular re-education, Balance training, Gait training, Patient/Family education, Joint mobilization, Dry Needling,  Cryotherapy, Moist heat, Taping, Manual therapy, and Re-evaluation.   PLAN FOR NEXT SESSION:  core, endurance and progressive lifting, loading        Letitia Libra, PT, DPT, ATC 12/25/21 11:43 AM

## 2021-12-28 ENCOUNTER — Ambulatory Visit: Payer: BC Managed Care – PPO | Admitting: Physical Therapy

## 2021-12-29 NOTE — Therapy (Signed)
OUTPATIENT PHYSICAL THERAPY TREATMENT NOTE   Patient Name: Cassandra Schultz MRN: [id number] DOB:01-10-2001, 21 y.o., female Today's Date: 01/01/2022  PCP: Horton Marshall, PA    REFERRING PROVIDER: Teryl Lucy, MD   END OF SESSION:   PT End of Session - 01/01/22 1104     Visit Number 7    Number of Visits 12    Date for PT Re-Evaluation 01/16/22    Authorization Type [employer]    PT Start Time 1104    PT Stop Time 1145    PT Time Calculation (min) 41 min    Activity Tolerance Patient tolerated treatment well    Behavior During Therapy WFL for tasks assessed/performed                 Past Medical History:  Diagnosis Date   ADHD    Allergy    Anxiety    Depression    reports ADHD    Eczema    Raynaud's syndrome    Tremor of both hands    Past Surgical History:  Procedure Laterality Date   NO PAST SURGERIES     WISDOM TOOTH EXTRACTION     Patient Active Problem List   Diagnosis Date Noted   Pollen-food allergy 08/25/2021   SVT (supraventricular tachycardia) (HCC) 05/09/2021   Palpitations 04/17/2021   Seasonal and perennial allergic rhinitis 09/21/2020   Seasonal allergic conjunctivitis 09/21/2020   Intrinsic atopic dermatitis 09/21/2020   Numbness and tingling of both legs - comes and goes after activity and onset of pain 08/03/2019   Fatigue 05/27/2019   Low back pain 02/24/2019   Bilateral knee pain 02/24/2019   Bilateral hip pain 02/24/2019   Acute bilateral ankle pain 02/24/2019   Healthcare maintenance 12/23/2018   Generalized anxiety disorder 04/03/2018   Attention deficit hyperactivity disorder (ADHD), combined type, severe 04/03/2018   Mild recurrent major depression (HCC) 04/03/2018   Developmental coordination disorder 04/03/2018   Allergy with anaphylaxis due to food 02/02/2016   Allergic rhinitis due to pollen 02/02/2016    REFERRING DIAG: Lumbar radiculopathy, M79.18 (ICD-10-CM) - Myalgia, other site    THERAPY DIAG:  Myalgia, other  site  Chronic bilateral low back pain without sciatica  Generalized joint pain  Muscle weakness (generalized)  Rationale for Evaluation and Treatment Rehabilitation  PERTINENT HISTORY: Hypermobility syndrome    PRECAUTIONS: None  SUBJECTIVE: Patient reports she is feeling good today without pain. She can tell a difference in her pain/stiffness when she doesn't do her exercises. She has been able to stand for longer periods of time.  PAIN:  Are you having pain? No at worst: NPRS scale: 6/10 Pain location:low back Pain description: dull; ache Aggravating factors: unknown Relieving factors: rest    OBJECTIVE: (objective measures completed at initial evaluation unless otherwise dated)   DIAGNOSTIC FINDINGS:  None recent avail.    PATIENT SURVEYS:  FOTO 26% 12/25/21: 69%    SCREENING FOR RED FLAGS: Bowel or bladder incontinence: No Spinal tumors: No Cauda equina syndrome: No Compression fracture: No Abdominal aneurysm: No   COGNITION:           Overall cognitive status: Within functional limits for tasks assessed                          SENSATION: WFL     POSTURE: rounded shoulders, forward head, and increased lumbar lordosis   PALPATION: Painful with palpation along paraspinals and R quadratus lumborum    LUMBAR ROM:  Active  A/PROM  eval 12/21/21  Flexion WFL knees bent , painful WFL; pain free   Extension WFL pain    Right lateral flexion WFL pain    Left lateral flexion WFL pain    Right rotation WFL   Left rotation WFL    (Blank rows = not tested)    Uses hands to rise up from floor from flexion due to pain    LOWER EXTREMITY MMT   Active  Right eval Left eval 12/11/21 01/01/22  Hip flexion 5 5    Hip extension 4- 4-  Lt 4+; Rt 5  Hip abduction        Hip adduction        Hip internal rotation        Hip external rotation        Knee flexion 4 4 5/5 bilateral   Knee extension 4+ 4+    Ankle dorsiflexion        Ankle plantarflexion         Ankle inversion        Ankle eversion         (Blank rows = not tested)   LOWER EXTREMITY AROM:   MMT Right eval Left eval  Hip flexion      Hip extension      Hip abduction      Hip adduction      Hip internal rotation      Hip external rotation      Knee flexion      Knee extension      Ankle dorsiflexion      Ankle plantarflexion      Ankle inversion      Ankle eversion       (Blank rows = not tested)   LUMBAR SPECIAL TESTS:  Slump test: Positive bilateral . Neg SLR    FUNCTIONAL TESTS:  NT   GAIT: Distance walked: NT Assistive device utilized: None Level of assistance:  NT Comments: NT       TODAY'S TREATMENT  [facility] Adult PT Treatment:                                                DATE: 01/01/22 Therapeutic Exercise: Recumbent bike level 3 x 5 minutes  Prone opposite arm/leg lift 2 x 10  Bird dog 2 x 10  Kettlebell squat 2 x 10; 15 lbs  Wall squat with alternating overhead reach 2 x 10 Dead lift with green resistance band x 10    [facility] Adult PT Treatment:                                                DATE: 12/25/21 Therapeutic Exercise: pallof press 2 x 10; green band recumbent bike x 5 minutes level 3 squat to table 2 x 10 @ 10 lbs  Runner's step up 2 x 10 @ 8 inch with overhead press 4lbs Lateral band walks blue band 4 sets d/b x 10 ft  Dead bug 1 x 10     [facility] Adult PT Treatment:  DATE: 12/21/21 Therapeutic Exercise: Recumbent bike x 5 minutes level 3; HR max 122  Sidelying leg taps 2 x 10; HR max 148 Side planks 2 x 30 sec; HR max 171 Fire hydrant 2 x 10 HR max 172 Updated HEP           PATIENT EDUCATION:  Education details:  N/A Person educated: N/A Education method: N/A Education comprehension: N/A     HOME EXERCISE PROGRAM: Access Code: [email or web address] URL: [email or web address] Date: 12/11/2021 Prepared by: Letitia Libra  Exercises - Supine with Legs Supported at 90/90   - 2 x daily - 7 x weekly - 1 sets - 1 reps - 30 hold - Supine Sciatic Nerve Glide  - 1 x daily - 7 x weekly - 1 sets - 10 reps - Supine Diaphragmatic Breathing  - 2 x daily - 7 x weekly - 2 sets - 10 reps - 10 hold - Sidelying Quadratus Lumborum Stretch on Table  - 1-2 x daily - 7 x weekly - 1 sets - 3 reps - 30 hold - Child's Pose with Sidebending  - 1 x daily - 7 x weekly - 1 sets - 5 reps - 30 hold - Supine Posterior Pelvic Tilt  - 1 x daily - 7 x weekly - 2 sets - 10 reps - 5 sec  hold - Clamshell with Resistance  - 1 x daily - 7 x weekly - 2 sets - 10 reps - Supine March  - 1 x daily - 7 x weekly - 2 sets - 10 reps   ASSESSMENT:   CLINICAL IMPRESSION: Patient tolerated session well today focusing on progressing core strengthening and introduction to functional lifting activity.  With squatting she demonstrates excessive trunk flexion during descent and excessive lordosis when she returns to standing. She is able to correct trunk flexion with verbal cues, though has continued difficulty returning to neutral standing positioning. She reports back pain during ascent of squatting, requiring continued focus on improving mechanics at future sessions. She demonstrates good form with dead lift with visible shaking in musculature, but is able to maintain back in neutral positioning with no pain reported during this functional movement.     OBJECTIVE IMPAIRMENTS decreased mobility, difficulty walking, decreased strength, increased fascial restrictions, pain, and hypermobility .    ACTIVITY LIMITATIONS carrying, lifting, bending, sitting, standing, squatting, and locomotion level   PARTICIPATION LIMITATIONS: driving, shopping, community activity, and occupation   PERSONAL FACTORS Past/current experiences and 3+ comorbidities: anxiety/depression, allergies, ADHD and chronic pain , fibromyalgia  are also affecting patient's functional outcome.    REHAB POTENTIAL: Excellent   CLINICAL DECISION  MAKING: Evolving/moderate complexity   EVALUATION COMPLEXITY: Moderate     GOALS:   LONG TERM GOALS: Target date: 01/16/2022   Pt will be I with HEP for core, posture and limbs Baseline:  Goal status: ACHIEVED   2.  Pt will be able to understand concepts of stability and control with functional movements and carry over into work, life  Baseline: needs reinforcement Goal status: INITIAL   3.  FOTO score will increase to 64% or better to demo improved functional mobility  Baseline:  Goal status: achieved   4.  Pt will be able to stand for 45 min before pain becomes moderate for improved work performance Baseline: 30 min pain increases to MOD and 60 min Max Goal status: INITIAL   5.  Pt will be able to tolerate lifting and squatting without increased back pain  Baseline:  Goal  status: INITIAL   6.  Pt will be able to tolerate PT session and still have reserve for another activity Baseline:  Goal status: INITIAL     PLAN: PT FREQUENCY: 2x/week   PT DURATION: 6 weeks   PLANNED INTERVENTIONS: Therapeutic exercises, Therapeutic activity, Neuromuscular re-education, Balance training, Gait training, Patient/Family education, Joint mobilization, Dry Needling, Cryotherapy, Moist heat, Taping, Manual therapy, and Re-evaluation.   PLAN FOR NEXT SESSION:  core, endurance and progressive lifting, loading        Letitia Libra, PT, DPT, ATC 01/01/22 11:53 AM

## 2022-01-01 ENCOUNTER — Ambulatory Visit: Payer: BC Managed Care – PPO

## 2022-01-01 ENCOUNTER — Ambulatory Visit (INDEPENDENT_AMBULATORY_CARE_PROVIDER_SITE_OTHER): Payer: BC Managed Care – PPO

## 2022-01-01 DIAGNOSIS — G8929 Other chronic pain: Secondary | ICD-10-CM

## 2022-01-01 DIAGNOSIS — M7918 Myalgia, other site: Secondary | ICD-10-CM | POA: Diagnosis not present

## 2022-01-01 DIAGNOSIS — M2550 Pain in unspecified joint: Secondary | ICD-10-CM

## 2022-01-01 DIAGNOSIS — J309 Allergic rhinitis, unspecified: Secondary | ICD-10-CM

## 2022-01-01 DIAGNOSIS — M6281 Muscle weakness (generalized): Secondary | ICD-10-CM

## 2022-01-04 ENCOUNTER — Encounter: Payer: Self-pay | Admitting: Physical Therapy

## 2022-01-04 ENCOUNTER — Telehealth: Payer: Self-pay | Admitting: Physical Therapy

## 2022-01-04 ENCOUNTER — Ambulatory Visit: Payer: BC Managed Care – PPO | Admitting: Physical Therapy

## 2022-01-04 NOTE — Telephone Encounter (Signed)
Called patient regardgin her missed appt today 11:00  Unable to leave voicemail due to mailbox being full.  She has had a previous no show and 1 late cancel.    She will be discharges from PT if she no shows OR late cancels her next visit which is 01/11/22.   [name], PT 01/04/22 11:35 AM Phone: [phone] Fax: [phone]

## 2022-01-04 NOTE — Therapy (Deleted)
OUTPATIENT PHYSICAL THERAPY TREATMENT NOTE   Patient Name: Cassandra Schultz MRN: [id number] DOB:February 26, 2001, 21 y.o., female, female Today's Date: 01/04/2022  PCP: Horton Marshall, PA    REFERRING PROVIDER: Teryl Lucy, MD   END OF SESSION:         Past Medical History:  Diagnosis Date   ADHD    Allergy    Anxiety    Depression    reports ADHD    Eczema    Raynaud's syndrome    Tremor of both hands    Past Surgical History:  Procedure Laterality Date   NO PAST SURGERIES     WISDOM TOOTH EXTRACTION     Patient Active Problem List   Diagnosis Date Noted   Pollen-food allergy 08/25/2021   SVT (supraventricular tachycardia) (HCC) 05/09/2021   Palpitations 04/17/2021   Seasonal and perennial allergic rhinitis 09/21/2020   Seasonal allergic conjunctivitis 09/21/2020   Intrinsic atopic dermatitis 09/21/2020   Numbness and tingling of both legs - comes and goes after activity and onset of pain 08/03/2019   Fatigue 05/27/2019   Low back pain 02/24/2019   Bilateral knee pain 02/24/2019   Bilateral hip pain 02/24/2019   Acute bilateral ankle pain 02/24/2019   Healthcare maintenance 12/23/2018   Generalized anxiety disorder 04/03/2018   Attention deficit hyperactivity disorder (ADHD), combined type, severe 04/03/2018   Mild recurrent major depression (HCC) 04/03/2018   Developmental coordination disorder 04/03/2018   Allergy with anaphylaxis due to food 02/02/2016   Allergic rhinitis due to pollen 02/02/2016    REFERRING DIAG: Lumbar radiculopathy, M79.18 (ICD-10-CM) - Myalgia, other site    THERAPY DIAG:  No diagnosis found.  Rationale for Evaluation and Treatment Rehabilitation  PERTINENT HISTORY: Hypermobility syndrome    PRECAUTIONS: None  SUBJECTIVE: Patient reports she is feeling good today without pain. She can tell a difference in her pain/stiffness when she doesn't do her exercises. She has been able to stand for longer periods of time.  PAIN:  Are you having  pain? No at worst: NPRS scale: 6/10 Pain location:low back Pain description: dull; ache Aggravating factors: unknown Relieving factors: rest    OBJECTIVE: (objective measures completed at initial evaluation unless otherwise dated)   DIAGNOSTIC FINDINGS:  None recent avail.    PATIENT SURVEYS:  FOTO 26% 12/25/21: 69%    SCREENING FOR RED FLAGS: Bowel or bladder incontinence: No Spinal tumors: No Cauda equina syndrome: No Compression fracture: No Abdominal aneurysm: No   COGNITION:           Overall cognitive status: Within functional limits for tasks assessed                          SENSATION: WFL     POSTURE: rounded shoulders, forward head, and increased lumbar lordosis   PALPATION: Painful with palpation along paraspinals and R quadratus lumborum    LUMBAR ROM:    Active  A/PROM  eval 12/21/21  Flexion WFL knees bent , painful WFL; pain free   Extension WFL pain    Right lateral flexion WFL pain    Left lateral flexion WFL pain    Right rotation WFL   Left rotation WFL    (Blank rows = not tested)    Uses hands to rise up from floor from flexion due to pain    LOWER EXTREMITY MMT   Active  Right eval Left eval 12/11/21 01/01/22  Hip flexion 5 5    Hip extension 4- 4-  Lt 4+; Rt 5  Hip abduction        Hip adduction        Hip internal rotation        Hip external rotation        Knee flexion 4 4 5/5 bilateral   Knee extension 4+ 4+    Ankle dorsiflexion        Ankle plantarflexion        Ankle inversion        Ankle eversion         (Blank rows = not tested)   LOWER EXTREMITY AROM:   MMT Right eval Left eval  Hip flexion      Hip extension      Hip abduction      Hip adduction      Hip internal rotation      Hip external rotation      Knee flexion      Knee extension      Ankle dorsiflexion      Ankle plantarflexion      Ankle inversion      Ankle eversion       (Blank rows = not tested)   LUMBAR SPECIAL TESTS:  Slump test:  Positive bilateral . Neg SLR    FUNCTIONAL TESTS:  NT   GAIT: Distance walked: NT Assistive device utilized: None Level of assistance:  NT Comments: NT       TODAY'S TREATMENT   [facility] Adult PT Treatment:                                                DATE: 01/04/22 Therapeutic Exercise: *** Manual Therapy: *** Neuromuscular re-ed: *** Therapeutic Activity: *** Modalities: *** Self Care: ***    [facility] PT Treatment:                                                DATE: 01/01/22 Therapeutic Exercise: Recumbent bike level 3 x 5 minutes  Prone opposite arm/leg lift 2 x 10  Bird dog 2 x 10  Kettlebell squat 2 x 10; 15 lbs  Wall squat with alternating overhead reach 2 x 10 Dead lift with green resistance band x 10    [facility] Adult PT Treatment:                                                DATE: 12/25/21 Therapeutic Exercise: pallof press 2 x 10; green band recumbent bike x 5 minutes level 3 squat to table 2 x 10 @ 10 lbs  Runner's step up 2 x 10 @ 8 inch with overhead press 4lbs Lateral band walks blue band 4 sets d/b x 10 ft  Dead bug 1 x 10     [facility] Adult PT Treatment:                                                DATE: 12/21/21 Therapeutic Exercise: Recumbent  bike x 5 minutes level 3; HR max 122  Sidelying leg taps 2 x 10; HR max 148 Side planks 2 x 30 sec; HR max 171 Fire hydrant 2 x 10 HR max 172 Updated HEP           PATIENT EDUCATION:  Education details:  N/A Person educated: N/A Education method: N/A Education comprehension: N/A     HOME EXERCISE PROGRAM: Access Code: [email or web address] URL: [email or web address] Date: 12/11/2021 Prepared by: Letitia Libra  Exercises - Supine with Legs Supported at 90/90  - 2 x daily - 7 x weekly - 1 sets - 1 reps - 30 hold - Supine Sciatic Nerve Glide  - 1 x daily - 7 x weekly - 1 sets - 10 reps - Supine Diaphragmatic Breathing  - 2 x daily - 7 x weekly - 2 sets - 10 reps - 10 hold - Sidelying  Quadratus Lumborum Stretch on Table  - 1-2 x daily - 7 x weekly - 1 sets - 3 reps - 30 hold - Child's Pose with Sidebending  - 1 x daily - 7 x weekly - 1 sets - 5 reps - 30 hold - Supine Posterior Pelvic Tilt  - 1 x daily - 7 x weekly - 2 sets - 10 reps - 5 sec  hold - Clamshell with Resistance  - 1 x daily - 7 x weekly - 2 sets - 10 reps - Supine March  - 1 x daily - 7 x weekly - 2 sets - 10 reps   ASSESSMENT:   CLINICAL IMPRESSION: Patient tolerated session well today focusing on progressing core strengthening and introduction to functional lifting activity.  With squatting she demonstrates excessive trunk flexion during descent and excessive lordosis when she returns to standing. She is able to correct trunk flexion with verbal cues, though has continued difficulty returning to neutral standing positioning. She reports back pain during ascent of squatting, requiring continued focus on improving mechanics at future sessions. She demonstrates good form with dead lift with visible shaking in musculature, but is able to maintain back in neutral positioning with no pain reported during this functional movement.     OBJECTIVE IMPAIRMENTS decreased mobility, difficulty walking, decreased strength, increased fascial restrictions, pain, and hypermobility .    ACTIVITY LIMITATIONS carrying, lifting, bending, sitting, standing, squatting, and locomotion level   PARTICIPATION LIMITATIONS: driving, shopping, community activity, and occupation   PERSONAL FACTORS Past/current experiences and 3+ comorbidities: anxiety/depression, allergies, ADHD and chronic pain , fibromyalgia  are also affecting patient's functional outcome.    REHAB POTENTIAL: Excellent   CLINICAL DECISION MAKING: Evolving/moderate complexity   EVALUATION COMPLEXITY: Moderate     GOALS:   LONG TERM GOALS: Target date: 01/16/2022   Pt will be I with HEP for core, posture and limbs Baseline:  Goal status: ACHIEVED   2.  Pt will be  able to understand concepts of stability and control with functional movements and carry over into work, life  Baseline: needs reinforcement Goal status: INITIAL   3.  FOTO score will increase to 64% or better to demo improved functional mobility  Baseline:  Goal status: achieved   4.  Pt will be able to stand for 45 min before pain becomes moderate for improved work performance Baseline: 30 min pain increases to MOD and 60 min Max Goal status: INITIAL   5.  Pt will be able to tolerate lifting and squatting without increased back pain  Baseline:  Goal status: INITIAL   6.  Pt will be able to tolerate PT session and still have reserve for another activity Baseline:  Goal status: INITIAL     PLAN: PT FREQUENCY: 2x/week   PT DURATION: 6 weeks   PLANNED INTERVENTIONS: Therapeutic exercises, Therapeutic activity, Neuromuscular re-education, Balance training, Gait training, Patient/Family education, Joint mobilization, Dry Needling, Cryotherapy, Moist heat, Taping, Manual therapy, and Re-evaluation.   PLAN FOR NEXT SESSION:  core, endurance and progressive lifting, loading        Letitia Libra, PT, DPT, ATC 01/04/22 9:37 AM

## 2022-01-10 NOTE — Therapy (Addendum)
OUTPATIENT PHYSICAL THERAPY TREATMENT NOTE  PHYSICAL THERAPY DISCHARGE SUMMARY  Visits from Start of Care: 8  Current functional level related to goals / functional outcomes: See goals below   Remaining deficits: Status unknown   Education / Equipment: N/A   Patient agrees to discharge. Patient goals were partially met. Patient is being discharged due to  attendance policy.    Patient Name: Cassandra Schultz MRN: [id number] DOB:Aug 25, 2000, 21 y.o., female Today's Date: 01/11/2022  PCP: Marilynne Drivers, [place]    REFERRING PROVIDER: Marchia Bond, MD   END OF SESSION:   PT End of Session - 01/11/22 1409     Visit Number 8    Number of Visits 12    Date for PT Re-Evaluation 01/16/22    Authorization Type [employer]    PT Start Time 1409   patient late   PT Stop Time 1443    PT Time Calculation (min) 34 min    Activity Tolerance Patient tolerated treatment well    Behavior During Therapy WFL for tasks assessed/performed                  Past Medical History:  Diagnosis Date   ADHD    Allergy    Anxiety    Depression    reports ADHD    Eczema    Raynaud's syndrome    Tremor of both hands    Past Surgical History:  Procedure Laterality Date   NO PAST SURGERIES     WISDOM TOOTH EXTRACTION     Patient Active Problem List   Diagnosis Date Noted   Pollen-food allergy 08/25/2021   SVT (supraventricular tachycardia) ([place]) 05/09/2021   Palpitations 04/17/2021   Seasonal and perennial allergic rhinitis 09/21/2020   Seasonal allergic conjunctivitis 09/21/2020   Intrinsic atopic dermatitis 09/21/2020   Numbness and tingling of both legs - comes and goes after activity and onset of pain 08/03/2019   Fatigue 05/27/2019   Low back pain 02/24/2019   Bilateral knee pain 02/24/2019   Bilateral hip pain 02/24/2019   Acute bilateral ankle pain 02/24/2019   Healthcare maintenance 12/23/2018   Generalized anxiety disorder 04/03/2018   Attention deficit hyperactivity  disorder (ADHD), combined type, severe 04/03/2018   Mild recurrent major depression ([place]) 04/03/2018   Developmental coordination disorder 04/03/2018   Allergy with anaphylaxis due to food 02/02/2016   Allergic rhinitis due to pollen 02/02/2016    REFERRING DIAG: Lumbar radiculopathy, M79.18 (ICD-10-CM) - Myalgia, other site    THERAPY DIAG:  Myalgia, other site  Chronic bilateral low back pain without sciatica  Generalized joint pain  Muscle weakness (generalized)  Rationale for Evaluation and Treatment Rehabilitation  PERTINENT HISTORY: Hypermobility syndrome    PRECAUTIONS: None  SUBJECTIVE: Patient reports her back is ok now, but was hurting last night which interfered with her sleep.    PAIN:  Are you having pain? No at worst: NPRS scale: 7/10 Pain location:low back Pain description: dull; ache Aggravating factors: unknown Relieving factors: rest    OBJECTIVE: (objective measures completed at initial evaluation unless otherwise dated)   DIAGNOSTIC FINDINGS:  None recent avail.    PATIENT SURVEYS:  FOTO 26% 12/25/21: 69%    SCREENING FOR RED FLAGS: Bowel or bladder incontinence: No Spinal tumors: No Cauda equina syndrome: No Compression fracture: No Abdominal aneurysm: No   COGNITION:           Overall cognitive status: Within functional limits for tasks assessed  SENSATION: WFL     POSTURE: rounded shoulders, forward head, and increased lumbar lordosis   PALPATION: Painful with palpation along paraspinals and R quadratus lumborum    LUMBAR ROM:    Active  A/PROM  eval 12/21/21  Flexion WFL knees bent , painful WFL; pain free   Extension WFL pain    Right lateral flexion WFL pain    Left lateral flexion WFL pain    Right rotation WFL   Left rotation WFL    (Blank rows = not tested)    Uses hands to rise up from floor from flexion due to pain    LOWER EXTREMITY MMT   Active  Right eval Left eval 12/11/21 01/01/22   Hip flexion 5 5    Hip extension 4- 4-  Lt 4+; Rt 5  Hip abduction        Hip adduction        Hip internal rotation        Hip external rotation        Knee flexion 4 4 5/5 bilateral   Knee extension 4+ 4+    Ankle dorsiflexion        Ankle plantarflexion        Ankle inversion        Ankle eversion         (Blank rows = not tested)   LOWER EXTREMITY AROM:   MMT Right eval Left eval  Hip flexion      Hip extension      Hip abduction      Hip adduction      Hip internal rotation      Hip external rotation      Knee flexion      Knee extension      Ankle dorsiflexion      Ankle plantarflexion      Ankle inversion      Ankle eversion       (Blank rows = not tested)   LUMBAR SPECIAL TESTS:  Slump test: Positive bilateral . Neg SLR    FUNCTIONAL TESTS:  NT   GAIT: Distance walked: NT Assistive device utilized: None Level of assistance:  NT Comments: NT       TODAY'S TREATMENT  [facility] Adult PT Treatment:                                                DATE: 01/11/22 Therapeutic Exercise: Recumbent bike level 3 x 5 minutes  Attempted dead lift with hex bar unable Dead lift 1 x 8 @ 15 lb dumbbell 2 x 8 @ 10 lb dumbbell  Leg press 3 x 10 @ 40 lbs  Reverse lunge 1 x 10   [facility] Adult PT Treatment:                                                DATE: 01/01/22 Therapeutic Exercise: Recumbent bike level 3 x 5 minutes  Prone opposite arm/leg lift 2 x 10  Bird dog 2 x 10  Kettlebell squat 2 x 10; 15 lbs  Wall squat with alternating overhead reach 2 x 10 Dead lift with green resistance band x 10    [facility] Adult PT Treatment:  DATE: 12/25/21 Therapeutic Exercise: pallof press 2 x 10; green band recumbent bike x 5 minutes level 3 squat to table 2 x 10 @ 10 lbs  Runner's step up 2 x 10 @ 8 inch with overhead press 4lbs Lateral band walks blue band 4 sets d/b x 10 ft  Dead bug 1 x 10             PATIENT EDUCATION:   Education details:  N/A Person educated: N/A Education method: N/A Education comprehension: N/A     HOME EXERCISE PROGRAM: Access Code: [email or web address] URL: [email or web address] Date: 12/11/2021 Prepared by: Gwendolyn Grant  Exercises - Supine with Legs Supported at 90/90  - 2 x daily - 7 x weekly - 1 sets - 1 reps - 30 hold - Supine Sciatic Nerve Glide  - 1 x daily - 7 x weekly - 1 sets - 10 reps - Supine Diaphragmatic Breathing  - 2 x daily - 7 x weekly - 2 sets - 10 reps - 10 hold - Sidelying Quadratus Lumborum Stretch on Table  - 1-2 x daily - 7 x weekly - 1 sets - 3 reps - 30 hold - Child's Pose with Sidebending  - 1 x daily - 7 x weekly - 1 sets - 5 reps - 30 hold - Supine Posterior Pelvic Tilt  - 1 x daily - 7 x weekly - 2 sets - 10 reps - 5 sec  hold - Clamshell with Resistance  - 1 x daily - 7 x weekly - 2 sets - 10 reps - Supine March  - 1 x daily - 7 x weekly - 2 sets - 10 reps   ASSESSMENT:   CLINICAL IMPRESSION: Session limited as patient was late for scheduled visit. Continued to progress lifting focusing on proper mechanics with moderate loads. She is able to maintain proper form with dead lift, but quickly fatigues with this exercise.  Overall good tolerance to today's session reporting minimal back pain during 3rd set of leg press, otherwise no reports of back pain throughout session. Anticipate D/C soon pending overall tolerance/independence with advanced home program that will be issued at next visit.   OBJECTIVE IMPAIRMENTS decreased mobility, difficulty walking, decreased strength, increased fascial restrictions, pain, and hypermobility .    ACTIVITY LIMITATIONS carrying, lifting, bending, sitting, standing, squatting, and locomotion level   PARTICIPATION LIMITATIONS: driving, shopping, community activity, and occupation   PERSONAL FACTORS Past/current experiences and 3+ comorbidities: anxiety/depression, allergies, ADHD and chronic pain , fibromyalgia   are also affecting patient's functional outcome.    REHAB POTENTIAL: Excellent   CLINICAL DECISION MAKING: Evolving/moderate complexity   EVALUATION COMPLEXITY: Moderate     GOALS:   LONG TERM GOALS: Target date: 01/16/2022   Pt will be I with HEP for core, posture and limbs Baseline:  Goal status: ACHIEVED   2.  Pt will be able to understand concepts of stability and control with functional movements and carry over into work, life  Baseline: needs reinforcement Goal status: INITIAL   3.  FOTO score will increase to 64% or better to demo improved functional mobility  Baseline:  Goal status: achieved   4.  Pt will be able to stand for 45 min before pain becomes moderate for improved work performance Baseline: 30 min pain increases to MOD and 60 min Max Goal status: INITIAL   5.  Pt will be able to tolerate lifting and squatting without increased back pain  Baseline:  Goal status: INITIAL   6.  Pt will be able to tolerate PT session and still have reserve for another activity Baseline:  Goal status: INITIAL     PLAN: PT FREQUENCY: 2x/week   PT DURATION: 6 weeks   PLANNED INTERVENTIONS: Therapeutic exercises, Therapeutic activity, Neuromuscular re-education, Balance training, Gait training, Patient/Family education, Joint mobilization, Dry Needling, Cryotherapy, Moist heat, Taping, Manual therapy, and Re-evaluation.   PLAN FOR NEXT SESSION:  core, endurance and progressive lifting, loading        Gwendolyn Grant, PT, DPT, ATC 01/11/22 2:43 PM  Gwendolyn Grant, PT, DPT, ATC 01/15/22 12:56 PM

## 2022-01-11 ENCOUNTER — Ambulatory Visit: Payer: BC Managed Care – PPO

## 2022-01-11 DIAGNOSIS — G8929 Other chronic pain: Secondary | ICD-10-CM

## 2022-01-11 DIAGNOSIS — M2550 Pain in unspecified joint: Secondary | ICD-10-CM

## 2022-01-11 DIAGNOSIS — M7918 Myalgia, other site: Secondary | ICD-10-CM

## 2022-01-11 DIAGNOSIS — M6281 Muscle weakness (generalized): Secondary | ICD-10-CM

## 2022-01-15 ENCOUNTER — Ambulatory Visit: Payer: BC Managed Care – PPO

## 2022-01-15 ENCOUNTER — Telehealth: Payer: Self-pay

## 2022-01-15 NOTE — Telephone Encounter (Signed)
Unable to leave voicemail regarding missed PT appointment as patient's mailbox is full. Due to attendance policy patient will be discharged and require a new referral to continue with PT.   Letitia Libra, PT, DPT, ATC 01/15/22 12:53 PM

## 2022-01-16 ENCOUNTER — Ambulatory Visit (INDEPENDENT_AMBULATORY_CARE_PROVIDER_SITE_OTHER): Payer: BC Managed Care – PPO

## 2022-01-16 DIAGNOSIS — J309 Allergic rhinitis, unspecified: Secondary | ICD-10-CM | POA: Diagnosis not present

## 2022-01-29 ENCOUNTER — Ambulatory Visit (INDEPENDENT_AMBULATORY_CARE_PROVIDER_SITE_OTHER): Payer: BC Managed Care – PPO | Admitting: *Deleted

## 2022-01-29 DIAGNOSIS — J309 Allergic rhinitis, unspecified: Secondary | ICD-10-CM

## 2022-02-01 ENCOUNTER — Other Ambulatory Visit: Payer: Self-pay

## 2022-02-01 ENCOUNTER — Encounter (HOSPITAL_COMMUNITY): Payer: Self-pay

## 2022-02-01 ENCOUNTER — Emergency Department (HOSPITAL_COMMUNITY)
Admission: EM | Admit: 2022-02-01 | Discharge: 2022-02-02 | Disposition: A | Discharge status: Home / Self Care | Payer: BC Managed Care – PPO | Attending: Emergency Medicine | Admitting: Emergency Medicine

## 2022-02-01 DIAGNOSIS — Z91010 Allergy to peanuts: Secondary | ICD-10-CM | POA: Insufficient documentation

## 2022-02-01 DIAGNOSIS — T7840XA Allergy, unspecified, initial encounter: Secondary | ICD-10-CM | POA: Insufficient documentation

## 2022-02-01 NOTE — ED Triage Notes (Addendum)
Arrives POV with c/o allergic reaction and self administering Epipen ~945 PM tonight.   Known allergy to cashew. Felt throat discomfort prior to administration. Was instructed to go to ER for eval afterwards.  No swelling present. Pt sts sx have returned to normal.

## 2022-02-01 NOTE — ED Provider Triage Note (Signed)
Emergency Medicine Provider Triage Evaluation Note  Cassandra Schultz , a 21 y.o. female  was evaluated in triage.  Pt complains of allergic reaction.  Known allergy to cashews, felt throat discomfort and self administered an EpiPen 1 hour ago around 9:45 PM.  Denies swelling, shortness of breath, difficulty swallowing, chest pain, dizziness, or lightheadedness. With some nausea, however has significantly lessened since arriving to ED.  Without vomiting.  Review of Systems  Positive:  Negative: See above  Physical Exam  BP 110/70   Pulse 67   Temp 98 F (36.7 C) (Oral)   Resp 18   Ht 5\' 8"  (1.727 m)   Wt 68 kg   SpO2 98%   BMI 22.81 kg/m  Gen:   Awake, no distress   Resp:  Normal effort, CTAB, able to communicate without difficulty MSK:   Moves extremities without difficulty  Other:  No evidence of widespread rash, dysphagia, tongue swelling, lip swelling, facial swelling, or anaphylaxis.  Medical Decision Making  Medically screening exam initiated at 10:56 PM.  Appropriate orders placed.  Yanina Knupp was informed that the remainder of the evaluation will be completed by another provider, this initial triage assessment does not replace that evaluation, and the importance of remaining in the ED until their evaluation is complete.     [name], PA-C 02/01/22 2333

## 2022-02-02 MED ORDER — FAMOTIDINE 20 MG PO TABS
20.0000 mg | ORAL_TABLET | Freq: Once | ORAL | Status: AC
  Administered 2022-02-02: 20 mg via ORAL
  Filled 2022-02-02: qty 1

## 2022-02-02 MED ORDER — PREDNISONE 10 MG PO TABS: 20.0000 mg | ORAL_TABLET | Freq: Every day | ORAL | 0 refills | Status: AC

## 2022-02-02 MED ORDER — METHYLPREDNISOLONE SODIUM SUCC 125 MG IJ SOLR
125.0000 mg | Freq: Once | INTRAMUSCULAR | Status: AC
  Administered 2022-02-02: 125 mg via INTRAMUSCULAR
  Filled 2022-02-02: qty 2

## 2022-02-02 MED ORDER — DIPHENHYDRAMINE HCL 25 MG PO CAPS
25.0000 mg | ORAL_CAPSULE | Freq: Once | ORAL | Status: AC
  Administered 2022-02-02: 25 mg via ORAL
  Filled 2022-02-02: qty 1

## 2022-02-02 NOTE — ED Notes (Signed)
Pt A&OX4 ambulatory at d/c with independent steady gait. Pt verbalized understanding of d/c instructions, prescription and follow up care. 

## 2022-02-02 NOTE — ED Provider Notes (Signed)
[facility]-EMERGENCY DEPT Provider Note   CSN: [id number] Arrival date & time: 02/01/22  2208     History  Chief Complaint  Patient presents with   Allergic Reaction    Cassandra Schultz is a 21 y.o. female.  HPI   Patient without significant medical history presents complaints of allergic reaction.  Patient states that she has a allergic reaction to cashews and  around 11 she had ice cream  which had cashews on it, she states after consuming the ice cream she started to feel tongue throat lip swelling, she gave herself an EpiPen at 1130, she states that her symptoms have improved, she denies  systemic rash or any GI symptoms.  She states that this was accidental, she was not aware that there were cashews on her ice cream.  She was  told to come here for further evaluation.  She has not taking H1 and H2 blockers, she states that she still has another EpiPen at home.    Home Medications Prior to Admission medications   Medication Sig Start Date End Date Taking? Authorizing Provider  predniSONE (DELTASONE) 10 MG tablet Take 2 tablets (20 mg total) by mouth daily for 4 days. 02/02/22 02/06/22 Yes Carroll Sage, PA-C  amphetamine-dextroamphetamine (ADDERALL) 20 MG tablet Take 1 tablet by mouth daily. 05/21/19   [provider]  Carbinoxamine Maleate 4 MG TABS TAKE 2 TABLETS BY MOUTH 2 TIMES DAILY AS NEEDED. 08/25/21   Ambs, Norvel Richards, FNP  cholecalciferol (VITAMIN D3) 25 MCG (1000 UNIT) tablet Take 1,000 Units by mouth daily. 1500 mg per day per pt    [provider]  drospirenone-ethinyl estradiol (YAZ) 3-0.02 MG tablet Take 1 tablet by mouth daily.    [provider]  DULoxetine (CYMBALTA) 20 MG capsule Take 20 mg by mouth daily. Taking 60 per day per pt    [provider]  EPINEPHrine (AUVI-Q) 0.3 mg/0.3 mL IJ SOAJ injection Inject 0.3 mg into the muscle as needed for anaphylaxis. 12/29/20   Nehemiah Settle, FNP  Fluticasone  Propionate Timmothy Sours) 93 MCG/ACT EXHU Place 2 sprays in each nostril twice a day for nasal congestion 08/25/21   Ambs, Norvel Richards, FNP  PREGABALIN PO Take 20 mg by mouth daily. 150 mg    [provider]  propranolol (INDERAL) 20 MG tablet Take 1 tablet (20 mg total) by mouth 2 (two) times daily. PLEASE TAKE 20mg  EVERY MORNING, MAY TAKE ADDITIONAL DOSE OF 20MG  DAILY AS NEEDED FOR PALPITATIONS- FOR A TOTAL OF TWICE DAILY. DO NOT TAKE MORE THAN 2 DOSES IN ONE DAY 11/14/21   , MD  tacrolimus (PROTOPIC) 0.1 % ointment Apply topically 2 (two) times daily as needed. Patient not taking: Reported on 12/05/2021 09/22/20   12/07/2021, FNP  triamcinolone ointment (KENALOG) 0.1 % 1 application 2 times daily as needed for stubborn, red, itchy areas. Patient not taking: Reported on 12/05/2021 09/21/20   12/07/2021, FNP      Allergies    Other, Peanut-containing drug products, Acyclovir and related, Peanut oil, and Amoxicillin-pot clavulanate    Review of Systems   Review of Systems  Constitutional:  Negative for chills and fever.  Respiratory:  Negative for shortness of breath.   Cardiovascular:  Negative for chest pain.  Gastrointestinal:  Negative for abdominal pain.  Neurological:  Negative for headaches.    Physical Exam Updated Vital Signs BP 97/68   Pulse 74   Temp 98.1 F (36.7 C) (  Oral)   Resp 18   Ht 5\' 8"  (1.727 m)   Wt 68 kg   SpO2 99%   BMI 22.81 kg/m  Physical Exam Vitals and nursing note reviewed.  Constitutional:      General: She is not in acute distress.    Appearance: She is not ill-appearing.  HENT:     Head: Normocephalic and atraumatic.     Nose: No congestion.     Mouth/Throat:     Mouth: Mucous membranes are moist.     Pharynx: Oropharynx is clear. No oropharyngeal exudate or posterior oropharyngeal erythema.     Comments: No trismus no torticollis no oral edema present, tongue uvula both midline controlling oral secretions tonsils are both equal  symmetrical bilaterally no submandibular swelling no hot potato voice. Eyes:     Conjunctiva/sclera: Conjunctivae normal.  Cardiovascular:     Rate and Rhythm: Normal rate and regular rhythm.     Pulses: Normal pulses.     Heart sounds: No murmur heard.    No friction rub. No gallop.  Pulmonary:     Effort: No respiratory distress.     Breath sounds: No wheezing, rhonchi or rales.  Skin:    General: Skin is warm and dry.     Comments: No hives present patient's upper/lower extremities back or abdomen.  Neurological:     Mental Status: She is alert.  Psychiatric:        Mood and Affect: Mood normal.     ED Results / Procedures / Treatments   Labs (all labs ordered are listed, but only abnormal results are displayed) Labs Reviewed - No data to display  EKG None  Radiology No results found.  Procedures Procedures    Medications Ordered in ED Medications  methylPREDNISolone sodium succinate (SOLU-MEDROL) 125 mg/2 mL injection 125 mg (125 mg Intramuscular Given 02/02/22 0249)  diphenhydrAMINE (BENADRYL) capsule 25 mg (25 mg Oral Given 02/02/22 0246)  famotidine (PEPCID) tablet 20 mg (20 mg Oral Given 02/02/22 0246)    ED Course/ Medical Decision Making/ A&P                           Medical Decision Making Risk Prescription drug management.   This patient presents to the ED for concern of allergic reaction, this involves an extensive number of treatment options, and is a complaint that carries with it a high risk of complications and morbidity.  The differential diagnosis includes anaphylaxis, angioedema, retropharyngeal/peritonsillar abscess    Additional history obtained:  Additional history obtained from N/A External records from outside source obtained and reviewed including N/A   Co morbidities that complicate the patient evaluation  N/A  Social Determinants of Health:  N/A    Lab Tests:  I Ordered, and personally interpreted labs.  The pertinent  results include: N/A   Imaging Studies ordered:  I ordered imaging studies including N/A I independently visualized and interpreted imaging which showed N/A I agree with the radiologist interpretation   Cardiac Monitoring:  The patient was maintained on a cardiac monitor.  I personally viewed and interpreted the cardiac monitored which showed an underlying rhythm of: N/A   Medicines ordered and prescription drug management:  I ordered medication including steroids, H1/H2 blockers I have reviewed the patients home medicines and have made adjustments as needed  Critical Interventions:  N/A   Reevaluation:  Presents with allergic reaction we will provide with H1 H2 blockers steroids  It has been  4 hours since onset of her symptoms, they have not recurred, patient is in agreement with plan discharge at this time.  Consultations Obtained:  N/A   Test Considered:  N/A    Rule out Low suspicion for angioedema as there is no oral oral edema present during my exam.  I have low suspicion for retropharyngeal/peritonsillar abscess no evidence of infection seen in the posterior pharynx.    Dispostion and problem list  After consideration of the diagnostic results and the patients response to treatment, I feel that the patent would benefit from discharge.  Allergic reaction-counseled patient on stay away from cashews, will start a short course of steroids, as well as H1 and H2 blockers follow-up with her PCP as needed strict return precautions.            Final Clinical Impression(s) / ED Diagnoses Final diagnoses:  Allergic reaction, initial encounter    Rx / DC Orders ED Discharge Orders          Ordered    predniSONE (DELTASONE) 10 MG tablet  Daily        02/02/22 0306              Marcello Fennel, PA-C 02/02/22 [id number]    Palumbo, April, MD 02/02/22 [phone]

## 2022-02-02 NOTE — ED Notes (Signed)
Chrissie Noa, PA at bedside.

## 2022-02-02 NOTE — Discharge Instructions (Signed)
I have started you on a short course of steroids please take as prescribed, also recommend taking Claritin as well as Pepcid for next 4 days, this will help with itchiness and hives.  If you develop any tongue throat lip swelling difficulty breathing, please give yourself your EpiPen and call 911 as you will need to be reassessed.  Follow-up with your PCP as needed  Come back to the emergency department if you develop chest pain, shortness of breath, severe abdominal pain, uncontrolled nausea, vomiting, diarrhea.

## 2022-03-06 ENCOUNTER — Ambulatory Visit (INDEPENDENT_AMBULATORY_CARE_PROVIDER_SITE_OTHER): Payer: BC Managed Care – PPO

## 2022-03-06 DIAGNOSIS — J309 Allergic rhinitis, unspecified: Secondary | ICD-10-CM | POA: Diagnosis not present

## 2022-03-19 ENCOUNTER — Ambulatory Visit (INDEPENDENT_AMBULATORY_CARE_PROVIDER_SITE_OTHER): Payer: BC Managed Care – PPO

## 2022-03-19 DIAGNOSIS — J309 Allergic rhinitis, unspecified: Secondary | ICD-10-CM

## 2022-03-28 ENCOUNTER — Ambulatory Visit (INDEPENDENT_AMBULATORY_CARE_PROVIDER_SITE_OTHER): Payer: BC Managed Care – PPO

## 2022-03-28 DIAGNOSIS — J309 Allergic rhinitis, unspecified: Secondary | ICD-10-CM | POA: Diagnosis not present

## 2022-04-09 ENCOUNTER — Ambulatory Visit (INDEPENDENT_AMBULATORY_CARE_PROVIDER_SITE_OTHER): Payer: BC Managed Care – PPO

## 2022-04-09 DIAGNOSIS — J309 Allergic rhinitis, unspecified: Secondary | ICD-10-CM | POA: Diagnosis not present

## 2022-04-20 ENCOUNTER — Ambulatory Visit (INDEPENDENT_AMBULATORY_CARE_PROVIDER_SITE_OTHER): Payer: BC Managed Care – PPO

## 2022-04-20 DIAGNOSIS — J309 Allergic rhinitis, unspecified: Secondary | ICD-10-CM

## 2022-05-07 ENCOUNTER — Ambulatory Visit (INDEPENDENT_AMBULATORY_CARE_PROVIDER_SITE_OTHER): Payer: BC Managed Care – PPO | Admitting: *Deleted

## 2022-05-07 DIAGNOSIS — J309 Allergic rhinitis, unspecified: Secondary | ICD-10-CM | POA: Diagnosis not present

## 2022-05-14 ENCOUNTER — Ambulatory Visit: Payer: BC Managed Care – PPO | Attending: Internal Medicine | Admitting: Internal Medicine

## 2022-05-21 ENCOUNTER — Ambulatory Visit (INDEPENDENT_AMBULATORY_CARE_PROVIDER_SITE_OTHER): Payer: BC Managed Care – PPO

## 2022-05-21 DIAGNOSIS — J309 Allergic rhinitis, unspecified: Secondary | ICD-10-CM

## 2022-05-24 ENCOUNTER — Encounter: Payer: Self-pay | Admitting: Internal Medicine

## 2022-05-31 ENCOUNTER — Ambulatory Visit: Payer: BC Managed Care – PPO | Attending: Internal Medicine | Admitting: Internal Medicine

## 2022-05-31 ENCOUNTER — Encounter: Payer: Self-pay | Admitting: Internal Medicine

## 2022-05-31 VITALS — BP 100/80 | HR 75 | Ht 69.0 in | Wt 158.4 lb

## 2022-05-31 DIAGNOSIS — R002 Palpitations: Secondary | ICD-10-CM | POA: Diagnosis not present

## 2022-05-31 NOTE — Progress Notes (Signed)
Cardiology Office Note:    Date:  05/31/2022   ID:  Cassandra Schultz, DOB 2001-01-01, MRN [id number]  PCP:  Wilfrid Lund, PA   [facility] Providers Cardiologist:  Maisie Fus, MD     Referring MD: Wilfrid Lund, PA   No chief complaint on file.  Palpitations, tachycardia   History of Present Illness:    Cassandra Schultz is a 21 y.o. female with a hx of ADHD on adderal, who comes in with palpitations.  [name] notes at work her heart rate is in the 130s to 140s. This has been going on since highschool. She notes bouts of fast heart rates in highschool.  She has LH, dizziness. She denies syncope. If she stands from sitting  she notes the [facility] and dizzy. It happens occasionally. She drinks caffeine. She has cut back from 16 oz per day. She takes adderal she was on 30 mg, she could not eat and lost weight. It was reduced to adderal 20 mg daily. She works part time at [employer]. She has no family hx of SCD. She has no hx of congential heart disease. She thinks her father had MI in his 37s.Her half brother has whole in his heart.    Orthostatics: Lying 100 / 63 mmHg pulse 88 Sitting 108/71 mmHg pulse 111 Stand 95/63 mmHg Stand (3 min) 10/66 mg pulse 113  TTE 04/04/2020 Normal LVEF, RV No valve dx No pulmonary htn  Interim Hx: Patient noted to have some SVT on ziopatch. She is symptomatic. Planned for metop but patient preferred propanolol/ Today, she comes in and states she is taking  propanolol. 10 mg daily. She is feeling less fast heart rates. She does not feel SOB, or tired. She is fine with this dose for now.   Interim Hx 5/30  Last visit she did not definitively meet criteria for POTS. She has mild SVT managed with propanolol. She notes with standing she notes higher heart rates. No recent loss consciousness. She feels persistently presyncopal with standing.  Interim Hx 12/14  Still taking propanolol each morning. It is helping.No syncope.  Cardiology  Studies 04/04/2020- normal echo Zio patch 04/17/2021-05/09/2021: Avg HR 100 bpm, runs of long RP tachycardia    Past Medical History:  Diagnosis Date   ADHD    Allergy    Anxiety    Depression    reports ADHD    Eczema    Raynaud's syndrome    Tremor of both hands     Past Surgical History:  Procedure Laterality Date   NO PAST SURGERIES     WISDOM TOOTH EXTRACTION      Current Medications: Current Outpatient Medications on File Prior to Visit  Medication Sig Dispense Refill   amphetamine-dextroamphetamine (ADDERALL) 20 MG tablet Take 1 tablet by mouth daily.     Carbinoxamine Maleate 4 MG TABS TAKE 2 TABLETS BY MOUTH 2 TIMES DAILY AS NEEDED. 120 tablet 2   cholecalciferol (VITAMIN D3) 25 MCG (1000 UNIT) tablet Take 1,000 Units by mouth daily. 1500 mg per day per pt     drospirenone-ethinyl estradiol (YAZ) 3-0.02 MG tablet Take 1 tablet by mouth daily.     DULoxetine (CYMBALTA) 20 MG capsule Take 20 mg by mouth daily. Taking 60 per day per pt     EPINEPHrine (AUVI-Q) 0.3 mg/0.3 mL IJ SOAJ injection Inject 0.3 mg into the muscle as needed for anaphylaxis. 1 each 1   Fluticasone Propionate (XHANCE) 93 MCG/ACT EXHU Place 2 sprays in  each nostril twice a day for nasal congestion 16 mL 2   PREGABALIN PO Take 20 mg by mouth daily. 150 mg     propranolol (INDERAL) 20 MG tablet Take 1 tablet (20 mg total) by mouth 2 (two) times daily. PLEASE TAKE 20mg  EVERY MORNING, MAY TAKE ADDITIONAL DOSE OF 20MG  DAILY AS NEEDED FOR PALPITATIONS- FOR A TOTAL OF TWICE DAILY. DO NOT TAKE MORE THAN 2 DOSES IN ONE DAY 60 tablet 3   tacrolimus (PROTOPIC) 0.1 % ointment Apply topically 2 (two) times daily as needed. 60 g 3   triamcinolone ointment (KENALOG) 0.1 % 1 application 2 times daily as needed for stubborn, red, itchy areas. 80 g 1   No current facility-administered medications on file prior to visit.    Allergies:   Other, Peanut-containing drug products, Acyclovir and related, Peanut oil, and  Amoxicillin-pot clavulanate   Social History   Socioeconomic History   Marital status: Single    Spouse name: Not on file   Number of children: 0   Years of education: Not on file   Highest education level: Some college, no degree  Occupational History   Not on file  Tobacco Use   Smoking status: Never   Smokeless tobacco: Never  Vaping Use   Vaping Use: Never used  Substance and Sexual Activity   Alcohol use: Never   Drug use: Never   Sexual activity: Yes    Birth control/protection: Condom, Pill  Other Topics Concern   Not on file  Social History Narrative   Lives at home with roommate   Caffeine: approx. 25-50 mg some days       ** Merged History Encounter **       Social Determinants of Health   Financial Resource Strain: Not on file  Food Insecurity: Not on file  Transportation Needs: Not on file  Physical Activity: Not on file  Stress: Not on file  Social Connections: Not on file     Family History: The patient's family history includes Alcohol abuse in her maternal grandfather and maternal grandmother; Allergic rhinitis in her father, mother, and paternal aunt; Angina in her maternal grandmother; Arthritis in her mother; Depression in her maternal grandmother and mother; ' disease in her mother; Hypothyroidism in her maternal grandmother; Neuropathy in her father; Other in her paternal grandfather. There is no history of Angioedema, Asthma, Eczema, Immunodeficiency, or Urticaria.  ROS:   Please see the history of present illness.     All other systems reviewed and are negative.  EKGs/Labs/Other Studies Reviewed:    The following studies were reviewed today:   EKG:  EKG is  ordered today.  The ekg ordered today demonstrates   04/17/2021-NSR Qtc Luiz Blare  05/31/2022- NSR  Recent Labs: No results found for requested labs within last 365 days.   Recent Lipid Panel    Component Value Date/Time   LDLDIRECT 74 05/27/2019 1344     Risk  Assessment/Calculations:           Physical Exam:    VS:   Vitals:   05/31/22 1629  BP: 100/80  Pulse: 75  SpO2: 97%     Wt Readings from Last 3 Encounters:  02/01/22 150 lb (68 kg)  11/14/21 151 lb 6.4 oz (68.7 kg)  08/25/21 157 lb (71.2 kg)     GEN:  Well nourished, well developed in no acute distress HEENT: moist mucous membranes CARDIAC: RRR, no murmurs, rubs, gallops RESPIRATORY:  Clear to auscultation without rales, wheezing  or rhonchi  ABDOMEN: Soft, non-tender, non-distended MUSCULOSKELETAL:  No edema; No deformity  SKIN: Warm and dry NEUROLOGIC:  Alert and oriented x 3 PSYCHIATRIC:  Normal affect   ASSESSMENT:    #SVT/Sinus tachycardia:  Initially screened for POTS. She had tachycardia with 3 minutes of standing c/f possible POTS however heart rate < 30 bpm increase. Increased propanolol to 20 mg in the AM and can uptitrate 20 mg BID. She was not orthostatic.  Referred her to syncope/dysautonomia clinic at [facility].   She had a zio 04/17/2021 that showed average HR 100, runs of  long RP SVT. She had a normal echo. No family hx of SCD. She is on adderall which contributes to high heart rates. Continue propanolol 20 mg BID PRN; takes 20 mg in the AM. Can continue  PLAN:    In order of problems listed above:   No Changes Follow up PRN   Medication Adjustments/Labs and Tests Ordered: Current medicines are reviewed at length with the patient today.  Concerns regarding medicines are outlined above.    Signed, Maisie Fus, MD  05/31/2022 2:27 PM    [facility]

## 2022-05-31 NOTE — Patient Instructions (Signed)
Medication Instructions:  Your physician recommends that you continue on your current medications as directed. Please refer to the Current Medication list given to you today.  *If you need a refill on your cardiac medications before your next appointment, please call your pharmacy*   Lab Work: NONE If you have labs (blood work) drawn today and your tests are completely normal, you will receive your results only by: MyChart Message (if you have MyChart) OR A paper copy in the mail If you have any lab test that is abnormal or we need to change your treatment, we will call you to review the results.   Testing/Procedures: NONE   Follow-Up: At [facility], you and your health needs are our priority.  As part of our continuing mission to provide you with exceptional heart care, we have created designated Provider Care Teams.  These Care Teams include your primary Cardiologist (physician) and Advanced Practice Providers (APPs -  Physician Assistants and Nurse Practitioners) who all work together to provide you with the care you need, when you need it.  We recommend signing up for the patient portal called "MyChart".  Sign up information is provided on this After Visit Summary.  MyChart is used to connect with patients for Virtual Visits (Telemedicine).  Patients are able to view lab/test results, encounter notes, upcoming appointments, etc.  Non-urgent messages can be sent to your provider as well.   To learn more about what you can do with MyChart, go to [email or web address].    Your next appointment:   As Needed    The format for your next appointment:   In Person  Provider:   Branch, Mary E, MD    

## 2022-06-06 ENCOUNTER — Ambulatory Visit (INDEPENDENT_AMBULATORY_CARE_PROVIDER_SITE_OTHER): Payer: BC Managed Care – PPO

## 2022-06-06 DIAGNOSIS — J309 Allergic rhinitis, unspecified: Secondary | ICD-10-CM | POA: Diagnosis not present

## 2022-06-29 ENCOUNTER — Ambulatory Visit (INDEPENDENT_AMBULATORY_CARE_PROVIDER_SITE_OTHER): Payer: BC Managed Care – PPO

## 2022-06-29 DIAGNOSIS — J309 Allergic rhinitis, unspecified: Secondary | ICD-10-CM | POA: Diagnosis not present

## 2022-07-12 ENCOUNTER — Ambulatory Visit (INDEPENDENT_AMBULATORY_CARE_PROVIDER_SITE_OTHER): Payer: BC Managed Care – PPO

## 2022-07-12 DIAGNOSIS — J309 Allergic rhinitis, unspecified: Secondary | ICD-10-CM

## 2022-07-13 ENCOUNTER — Telehealth: Payer: Self-pay | Admitting: Internal Medicine

## 2022-07-13 NOTE — Telephone Encounter (Signed)
Patient is requesting to switch providers from Dr. Harl Bowie to Dr. Oval Linsey if possible.  Please advise,  Thank you

## 2022-07-16 DIAGNOSIS — J3089 Other allergic rhinitis: Secondary | ICD-10-CM

## 2022-07-17 NOTE — Progress Notes (Signed)
VIALS EXP 07-18-23 

## 2022-07-23 ENCOUNTER — Ambulatory Visit (INDEPENDENT_AMBULATORY_CARE_PROVIDER_SITE_OTHER): Payer: BC Managed Care – PPO

## 2022-07-23 DIAGNOSIS — J309 Allergic rhinitis, unspecified: Secondary | ICD-10-CM | POA: Diagnosis not present

## 2022-08-07 ENCOUNTER — Ambulatory Visit (INDEPENDENT_AMBULATORY_CARE_PROVIDER_SITE_OTHER): Payer: BC Managed Care – PPO

## 2022-08-07 DIAGNOSIS — J309 Allergic rhinitis, unspecified: Secondary | ICD-10-CM

## 2022-08-27 ENCOUNTER — Ambulatory Visit (INDEPENDENT_AMBULATORY_CARE_PROVIDER_SITE_OTHER): Payer: BC Managed Care – PPO | Admitting: *Deleted

## 2022-08-27 DIAGNOSIS — J309 Allergic rhinitis, unspecified: Secondary | ICD-10-CM | POA: Diagnosis not present

## 2022-09-19 ENCOUNTER — Ambulatory Visit: Payer: BC Managed Care – PPO | Attending: Family Medicine | Admitting: Physical Therapy

## 2022-09-19 ENCOUNTER — Encounter: Payer: Self-pay | Admitting: Physical Therapy

## 2022-09-19 DIAGNOSIS — M7918 Myalgia, other site: Secondary | ICD-10-CM | POA: Insufficient documentation

## 2022-09-19 DIAGNOSIS — G8929 Other chronic pain: Secondary | ICD-10-CM | POA: Diagnosis present

## 2022-09-19 DIAGNOSIS — M545 Low back pain, unspecified: Secondary | ICD-10-CM

## 2022-09-19 DIAGNOSIS — M2550 Pain in unspecified joint: Secondary | ICD-10-CM | POA: Diagnosis present

## 2022-09-19 DIAGNOSIS — M6281 Muscle weakness (generalized): Secondary | ICD-10-CM

## 2022-09-19 NOTE — Therapy (Signed)
OUTPATIENT PHYSICAL THERAPY THORACOLUMBAR EVALUATION   Patient Name: Cassandra Schultz MRN: [id number] DOB:[birth year], 22 y.o., female Today's Date: 09/19/2022  END OF SESSION:  PT End of Session - 09/19/22 1025     Visit Number 1    Number of Visits 16    Date for PT Re-Evaluation 11/14/22    Authorization Type BCBS    PT Start Time 1100    PT Stop Time 1150    PT Time Calculation (min) 50 min    Activity Tolerance Patient tolerated treatment well;Patient limited by pain    Behavior During Therapy WFL for tasks assessed/performed             Past Medical History:  Diagnosis Date   ADHD    Allergy    Anxiety    Depression    reports ADHD    Eczema    Raynaud's syndrome    Tremor of both hands    Past Surgical History:  Procedure Laterality Date   NO PAST SURGERIES     WISDOM TOOTH EXTRACTION     Patient Active Problem List   Diagnosis Date Noted   Pollen-food allergy 08/25/2021   SVT (supraventricular tachycardia) 05/09/2021   Palpitations 04/17/2021   Seasonal and perennial allergic rhinitis 09/21/2020   Seasonal allergic conjunctivitis 09/21/2020   Intrinsic atopic dermatitis 09/21/2020   Numbness and tingling of both legs - comes and goes after activity and onset of pain 08/03/2019   Fatigue 05/27/2019   Low back pain 02/24/2019   Bilateral knee pain 02/24/2019   Bilateral hip pain 02/24/2019   Acute bilateral ankle pain 02/24/2019   Healthcare maintenance 12/23/2018   Generalized anxiety disorder 04/03/2018   Attention deficit hyperactivity disorder (ADHD), combined type, severe 04/03/2018   Mild recurrent major depression 04/03/2018   Developmental coordination disorder 04/03/2018   Allergy with anaphylaxis due to food 02/02/2016   Allergic rhinitis due to pollen 02/02/2016    PCP: Marilynne Drivers, PA   REFERRING PROVIDER: Marilynne Drivers, PA   REFERRING DIAG: M79.7 (ICD-10-CM) - Fibromyalgia  Rationale for Evaluation and Treatment:  Rehabilitation  THERAPY DIAG:  Myalgia, other site  Chronic bilateral low back pain without sciatica  Generalized joint pain  Muscle weakness (generalized)  ONSET DATE: Chronic , acute about 1 mos   SUBJECTIVE:                                                                                                                                                                                           SUBJECTIVE STATEMENT: This pain is familiar to me and has had PT throughout the years since 2017. She continues to  have joint pain, but reports an increase in muscle pain to the point she can barely stand to be touched.  The pain is widespread in her upper, middle and lower back, legs.  Pain increased in her back about 4 weeks ago after she and her mom moved.  She has had increased back pain since then.  She complains of hip and knee pain as well, weakness with transfers , squatting and doing stairs.  She does not regularly wear a back brace.  She has O2 monitor and she will have racing HR at times. She has not had any recent images.  She is unable to exercise and work in the same day.  She works part time at [employer], has increased fatigue and muscle burn when squatting to lift.    PERTINENT HISTORY:  Hypermobility syndrome POTS See above   PAIN:  Are you having pain? Yes: NPRS scale: 7/10 Pain location: bilateral lumbar  Pain description: sharp, achy, tight  Aggravating factors: activity (sit to stand, squatting, walking)  Relieving factors: nothing, very min with stretching, resting for 15-30 min will bring pain down when she can lie down   PRECAUTIONS: Other: dysautonomia, does not pass out but feels intolerable  WEIGHT BEARING RESTRICTIONS: No  FALLS:  Has patient fallen in last 6 months? No Loses balance often    LIVING ENVIRONMENT: Lives with: lives with their family and roommate Lives in: House/apartment Stairs: Yes: Internal: 12 steps; on right going up Has following  equipment at home: None  OCCUPATION: Working at [employer] part time   PLOF: Independent, Vocation/Vocational requirements: has accommodations for sitting but does have to lift, walk, squat, and Leisure: drawing, reading, used to be more active and enjoys lifting weights, cardio  PATIENT GOALS: Increase activity, go back to basics , start lighter and progress   NEXT MD VISIT: this week   OBJECTIVE:   DIAGNOSTIC FINDINGS:  None recent   PATIENT SURVEYS:  FOTO 39% Fatigue Assessment Scale TBA    SCREENING FOR RED FLAGS: Bowel or bladder incontinence: No Spinal tumors: No Cauda equina syndrome: No Compression fracture: No Abdominal aneurysm: No  COGNITION: Overall cognitive status: Within functional limits for tasks assessed     SENSATION: WFL  MUSCLE LENGTH: Hamstrings: Right tight  deg; Left tight deg Thomas test: Right tight deg; Left tight deg  POSTURE: rounded shoulders and forward head walks with toes in and flexed trunk   PALPATION: Not well tolerated throughout hips and lumbar spine   LUMBAR ROM:   AROM eval  Flexion Pain, ultimately WNL with difficulty, uses 1 arm to support on thigh, knees bent    Extension 50% jerky/spasms   Right lateral flexion WNL   Left lateral flexion WNL   Right rotation WNL  Left rotation WNL  Guarded with motions and prepped with deep breathing   LOWER EXTREMITY ROM:    WNL  Active  Right eval Left eval  Hip flexion    Hip extension    Hip abduction    Hip adduction    Hip internal rotation    Hip external rotation    Knee flexion    Knee extension    Ankle dorsiflexion    Ankle plantarflexion    Ankle inversion    Ankle eversion     (Blank rows = not tested)  LOWER EXTREMITY MMT:   WNL in hip flex, knee ext and flex    MMT Right eval Left eval  Hip flexion  Hip extension    Hip abduction    Hip adduction    Hip internal rotation    Hip external rotation    Knee flexion    Knee extension    Ankle  dorsiflexion    Ankle plantarflexion    Ankle inversion    Ankle eversion     (Blank rows = not tested)  LUMBAR SPECIAL TESTS:  Straight leg raise test: Negative, Single leg stance test: Negative, and FABER test: Negative  FUNCTIONAL TESTS:  2 minute walk test: 532 feet, HR started at 115 at rest and HR response was blunted.  True resting was not tested in supine.  POTS   SLS WNL with L LE pain  GAIT: Distance walked: 532 Assistive device utilized: None Level of assistance: Complete Independence Comments: back pain throughout   TODAY'S TREATMENT:                                                                                                                              DATE: 09/19/22    PATIENT EDUCATION:  Education details: HEP, breathing, core, PT long term, stability, pain centralization syndrome  Person educated: Patient Education method: Explanation, Demonstration, Verbal cues, and Handouts Education comprehension: verbalized understanding and needs further education  HOME EXERCISE PROGRAM: Access Code: [email or web address] URL: [email or web address] Date: 09/19/2022 Prepared by: Raeford Razor  Exercises - Quadruped Transversus Abdominis Bracing  - 2 x daily - 7 x weekly - 2 sets - 10 reps - 5-10 hold - Quadruped Rocking Backward  - 2 x daily - 7 x weekly - 1 sets - 10 reps - 5 hold - Beginner Front Arm Support  - 2 x daily - 7 x weekly - 2 sets - 10 reps - Supine 90/90 Abdominal Bracing  - 2 x daily - 7 x weekly - 1 sets - 3-5 reps - 30 hold  ASSESSMENT:  CLINICAL IMPRESSION: Patient is a 22 y.o. female who was seen today for physical therapy evaluation and treatment for fibromyalgia in the presence of hypermobility.  Pain is mostly in her back and has increased since about 4 weeks ago after moving.  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  She has been doing different modalities (breathing, meditation) for trying  to address her pain mgmt but without much progress.  She has been to the [facility] clinic for OT and she reports limited information.  She will benefit from skilled PT to provide exercises and a structured program but also help manage her pain conservatively. She has benefited before and will likely improve again but long term exercise is paramount and balancing with a gentle consistent plan.   OBJECTIVE IMPAIRMENTS: Abnormal gait, decreased activity tolerance, decreased balance, decreased endurance, decreased mobility, difficulty walking, decreased ROM, decreased strength, increased fascial restrictions, increased muscle spasms, impaired flexibility, improper body mechanics, postural dysfunction, and pain.   ACTIVITY LIMITATIONS: carrying, lifting, bending, sitting, standing,  squatting, sleeping, transfers, and locomotion level  PARTICIPATION LIMITATIONS: cleaning, interpersonal relationship, shopping, community activity, and occupation  PERSONAL FACTORS: Behavior pattern, Profession, Social background, Time since onset of injury/illness/exacerbation, and 3+ comorbidities: chronic pain, allergies, POTS  are also affecting patient's functional outcome.   REHAB POTENTIAL: Good  CLINICAL DECISION MAKING: Evolving/moderate complexity  EVALUATION COMPLEXITY: Moderate   GOALS: Goals reviewed with patient? Yes  SHORT TERM GOALS: Target date: 10/17/2022    Pt will be I with basic HEP for core, trunk stability at least 3 days per week  Baseline: Goal status: INITIAL  2.  Pt will be able to report no back pain with sit to stand, toilet transfers Baseline:  Goal status: INITIAL  3.  Pt will be able to demo appropriate hip hinge and squat mechanics in order to preserve spine health.  Baseline:  Goal status: INITIAL  4.  Pt will complete FAS and set goal (fatigue assessment scale)  Baseline:  Goal status: INITIAL   LONG TERM GOALS: Target date: 11/14/2022    Pt will be I with HEP upon  discharge to include core, hips, LE and postural strength  Baseline:  Goal status: INITIAL  2.  Pt will improve FOTO to 57% to demo improved functional mobility Baseline:  Goal status: INITIAL  3.  Pt will be able to attend therapy and also be able to do home tasks, work with min increase in pain/fatigue Baseline: unable to do this  Goal status: INITIAL  4.  Pt will be able to lift 25 lbs from the floor without increased back pain  Baseline:  Goal status: INITIAL  5.  Further goals TBA (FAS, 2 min walk, balance )  Baseline:  Goal status: INITIAL   PLAN:  PT FREQUENCY: 2x/week  PT DURATION: 8 weeks  PLANNED INTERVENTIONS: Therapeutic exercises, Therapeutic activity, Neuromuscular re-education, Balance training, Gait training, Patient/Family education, Self Care, Joint mobilization, Dry Needling, Electrical stimulation, Spinal mobilization, Cryotherapy, Moist heat, Manual therapy, and Re-evaluation.  PLAN FOR NEXT SESSION: check HEP, progress as needed.  Include bike, UBE.  Stabilization a priority.  Tools for PNE education. Consider TENS.    Garnetta Fedrick, PT 09/19/2022, 12:01 PM   Raeford Razor, PT 09/19/22 1:35 PM Phone: [phone] Fax: [phone]

## 2022-09-27 ENCOUNTER — Ambulatory Visit (INDEPENDENT_AMBULATORY_CARE_PROVIDER_SITE_OTHER): Payer: BC Managed Care – PPO

## 2022-09-27 DIAGNOSIS — J309 Allergic rhinitis, unspecified: Secondary | ICD-10-CM | POA: Diagnosis not present

## 2022-10-01 ENCOUNTER — Ambulatory Visit: Payer: BC Managed Care – PPO

## 2022-10-01 DIAGNOSIS — M545 Low back pain, unspecified: Secondary | ICD-10-CM

## 2022-10-01 DIAGNOSIS — G8929 Other chronic pain: Secondary | ICD-10-CM

## 2022-10-01 DIAGNOSIS — M7918 Myalgia, other site: Secondary | ICD-10-CM | POA: Diagnosis not present

## 2022-10-01 DIAGNOSIS — M6281 Muscle weakness (generalized): Secondary | ICD-10-CM

## 2022-10-01 DIAGNOSIS — M2550 Pain in unspecified joint: Secondary | ICD-10-CM

## 2022-10-01 NOTE — Therapy (Signed)
OUTPATIENT PHYSICAL THERAPY TREATMENT NOTE   Patient Name: Cassandra Schultz MRN: [id number] DOB:Jul 27, 2000, 22 y.o., female Today's Date: 10/01/2022  PCP: Horton Marshall, PA  REFERRING PROVIDER: Horton Marshall, PA   END OF SESSION:   PT End of Session - 10/01/22 0935     Visit Number 2    Number of Visits 16    Date for PT Re-Evaluation 11/14/22    Authorization Type [employer]    PT Start Time 0934    PT Stop Time 1013    PT Time Calculation (min) 39 min    Activity Tolerance Patient tolerated treatment well    Behavior During Therapy WFL for tasks assessed/performed             Past Medical History:  Diagnosis Date   ADHD    Allergy    Anxiety    Depression    reports ADHD    Eczema    Raynaud's syndrome    Tremor of both hands    Past Surgical History:  Procedure Laterality Date   NO PAST SURGERIES     WISDOM TOOTH EXTRACTION     Patient Active Problem List   Diagnosis Date Noted   Pollen-food allergy 08/25/2021   SVT (supraventricular tachycardia) 05/09/2021   Palpitations 04/17/2021   Seasonal and perennial allergic rhinitis 09/21/2020   Seasonal allergic conjunctivitis 09/21/2020   Intrinsic atopic dermatitis 09/21/2020   Numbness and tingling of both legs - comes and goes after activity and onset of pain 08/03/2019   Fatigue 05/27/2019   Low back pain 02/24/2019   Bilateral knee pain 02/24/2019   Bilateral hip pain 02/24/2019   Acute bilateral ankle pain 02/24/2019   Healthcare maintenance 12/23/2018   Generalized anxiety disorder 04/03/2018   Attention deficit hyperactivity disorder (ADHD), combined type, severe 04/03/2018   Mild recurrent major depression 04/03/2018   Developmental coordination disorder 04/03/2018   Allergy with anaphylaxis due to food 02/02/2016   Allergic rhinitis due to pollen 02/02/2016    REFERRING DIAG: M79.7 (ICD-10-CM) - Fibromyalgia   THERAPY DIAG:  Chronic bilateral low back pain without sciatica  Myalgia, other  site  Generalized joint pain  Muscle weakness (generalized)  Rationale for Evaluation and Treatment Rehabilitation  PERTINENT HISTORY: Hypermobility syndrome POTS See above     PRECAUTIONS: Other: dysautonomia, does not pass out but feels intolerable   SUBJECTIVE:                                                                                                                                                                                      SUBJECTIVE STATEMENT:  "Today is a good day." Patient reports her back feels fine  right now. She is noticing more pain in her knees after a work day.   PAIN:  Are you having pain? Yes: NPRS scale: 1/10 Pain location: bilateral lumbar  Pain description: pinch  Aggravating factors: activity (sit to stand, squatting, walking)  Relieving factors: nothing, very min with stretching, resting for 15-30 min will bring pain down when she can lie down    OBJECTIVE: (objective measures completed at initial evaluation unless otherwise dated)  DIAGNOSTIC FINDINGS:  None recent    PATIENT SURVEYS:  FOTO 39% Fatigue Assessment Scale 10/01/22: 29/50      SCREENING FOR RED FLAGS: Bowel or bladder incontinence: No Spinal tumors: No Cauda equina syndrome: No Compression fracture: No Abdominal aneurysm: No   COGNITION: Overall cognitive status: Within functional limits for tasks assessed                          SENSATION: WFL   MUSCLE LENGTH: Hamstrings: Right tight  deg; Left tight deg Thomas test: Right tight deg; Left tight deg   POSTURE: rounded shoulders and forward head walks with toes in and flexed trunk    PALPATION: Not well tolerated throughout hips and lumbar spine    LUMBAR ROM:    AROM eval  Flexion Pain, ultimately WNL with difficulty, uses 1 arm to support on thigh, knees bent    Extension 50% jerky/spasms   Right lateral flexion WNL   Left lateral flexion WNL   Right rotation WNL  Left rotation WNL  Guarded with  motions and prepped with deep breathing    LOWER EXTREMITY ROM:    WNL  Active  Right eval Left eval  Hip flexion      Hip extension      Hip abduction      Hip adduction      Hip internal rotation      Hip external rotation      Knee flexion      Knee extension      Ankle dorsiflexion      Ankle plantarflexion      Ankle inversion      Ankle eversion       (Blank rows = not tested)   LOWER EXTREMITY MMT:   WNL in hip flex, knee ext and flex      MMT Right eval Left eval  Hip flexion      Hip extension      Hip abduction      Hip adduction      Hip internal rotation      Hip external rotation      Knee flexion      Knee extension      Ankle dorsiflexion      Ankle plantarflexion      Ankle inversion      Ankle eversion       (Blank rows = not tested)   LUMBAR SPECIAL TESTS:  Straight leg raise test: Negative, Single leg stance test: Negative, and FABER test: Negative   FUNCTIONAL TESTS:  2 minute walk test: 532 feet, HR started at 115 at rest and HR response was blunted.  True resting was not tested in supine.  POTS   SLS WNL with L LE pain   GAIT: Distance walked: 532 Assistive device utilized: None Level of assistance: Complete Independence Comments: back pain throughout  [facility] Adult PT Treatment:  DATE: 10/01/22 Heart rate monitored periodically throughout there ex: 116 bpm, 126 bpm, 119 bpm   Therapeutic Exercise: Recumbent bike level 1 x 5 minutes; start time HR: 70 bpm, end time: 112 bpm  90/90 TA brace 2 x 15 seconds Quadruped TA contraction x 10; 10 sec hold  Quadruped leg extension 2 x 10  SLR x 10 each         PATIENT EDUCATION:  Education details: FAS Person educated: Patient Education method: Explanation Education comprehension: verbalized understanding   HOME EXERCISE PROGRAM: Access Code: [email or web address] URL: [email or web address] Date: 09/19/2022 Prepared by: [name]    Exercises - Quadruped Transversus Abdominis Bracing  - 2 x daily - 7 x weekly - 2 sets - 10 reps - 5-10 hold - Quadruped Rocking Backward  - 2 x daily - 7 x weekly - 1 sets - 10 reps - 5 hold - Beginner Front Arm Support  - 2 x daily - 7 x weekly - 2 sets - 10 reps - Supine 90/90 Abdominal Bracing  - 2 x daily - 7 x weekly - 1 sets - 3-5 reps - 30 hold   ASSESSMENT:   CLINICAL IMPRESSION: Patient arrives for first PT treatment session reporting that "today is a good day" with minimal back pain. Heart rate remains stable throughout session with good tolerance to aerobic activity on recumbent bike at beginning of session. Reviewed HEP with patient demonstrating good core activation with basic core stabilization exercises, but quickly fatigues. FAS was captured with LTG set.    OBJECTIVE IMPAIRMENTS: Abnormal gait, decreased activity tolerance, decreased balance, decreased endurance, decreased mobility, difficulty walking, decreased ROM, decreased strength, increased fascial restrictions, increased muscle spasms, impaired flexibility, improper body mechanics, postural dysfunction, and pain.    ACTIVITY LIMITATIONS: carrying, lifting, bending, sitting, standing, squatting, sleeping, transfers, and locomotion level   PARTICIPATION LIMITATIONS: cleaning, interpersonal relationship, shopping, community activity, and occupation   PERSONAL FACTORS: Behavior pattern, Profession, Social background, Time since onset of injury/illness/exacerbation, and 3+ comorbidities: chronic pain, allergies, POTS  are also affecting patient's functional outcome.    REHAB POTENTIAL: Good   CLINICAL DECISION MAKING: Evolving/moderate complexity   EVALUATION COMPLEXITY: Moderate     GOALS: Goals reviewed with patient? Yes   SHORT TERM GOALS: Target date: 10/17/2022       Pt will be I with basic HEP for core, trunk stability at least 3 days per week  Baseline: Goal status: INITIAL   2.  Pt will be able to  report no back pain with sit to stand, toilet transfers Baseline:  Goal status: INITIAL   3.  Pt will be able to demo appropriate hip hinge and squat mechanics in order to preserve spine health.  Baseline:  Goal status: INITIAL   4.  Pt will complete FAS and set goal (fatigue assessment scale)  Baseline:  Goal status: met      LONG TERM GOALS: Target date: 11/14/2022     Pt will be I with HEP upon discharge to include core, hips, LE and postural strength  Baseline:  Goal status: INITIAL   2.  Pt will improve FOTO to 57% to demo improved functional mobility Baseline:  Goal status: INITIAL   3.  Pt will be able to attend therapy and also be able to do home tasks, work with min increase in pain/fatigue Baseline: unable to do this  Goal status: INITIAL   4.  Pt will be able to lift 25 lbs from the floor without increased back  pain  Baseline:  Goal status: INITIAL   5. Patient will score </= 25/50 on the FAS to signify a clinically meaningful reduction in fatigue.  Baseline: 29 Goal status: INITIAL     PLAN:   PT FREQUENCY: 2x/week   PT DURATION: 8 weeks   PLANNED INTERVENTIONS: Therapeutic exercises, Therapeutic activity, Neuromuscular re-education, Balance training, Gait training, Patient/Family education, Self Care, Joint mobilization, Dry Needling, Electrical stimulation, Spinal mobilization, Cryotherapy, Moist heat, Manual therapy, and Re-evaluation.   PLAN FOR NEXT SESSION: check HEP, progress as needed.  Include bike, UBE.  Stabilization a priority.  Tools for PNE education. Consider TENS.     Letitia Libra, PT, DPT, ATC 10/01/22 10:14 AM

## 2022-10-03 NOTE — Therapy (Deleted)
OUTPATIENT PHYSICAL THERAPY TREATMENT NOTE   Patient Name: Cassandra Schultz MRN: [id number] DOB:2001-06-08, 22 y.o., female Today's Date: 10/03/2022  PCP: Horton Marshall, PA  REFERRING PROVIDER: Horton Marshall, PA   END OF SESSION:     Past Medical History:  Diagnosis Date   ADHD    Allergy    Anxiety    Depression    reports ADHD    Eczema    Raynaud's syndrome    Tremor of both hands    Past Surgical History:  Procedure Laterality Date   NO PAST SURGERIES     WISDOM TOOTH EXTRACTION     Patient Active Problem List   Diagnosis Date Noted   Pollen-food allergy 08/25/2021   SVT (supraventricular tachycardia) 05/09/2021   Palpitations 04/17/2021   Seasonal and perennial allergic rhinitis 09/21/2020   Seasonal allergic conjunctivitis 09/21/2020   Intrinsic atopic dermatitis 09/21/2020   Numbness and tingling of both legs - comes and goes after activity and onset of pain 08/03/2019   Fatigue 05/27/2019   Low back pain 02/24/2019   Bilateral knee pain 02/24/2019   Bilateral hip pain 02/24/2019   Acute bilateral ankle pain 02/24/2019   Healthcare maintenance 12/23/2018   Generalized anxiety disorder 04/03/2018   Attention deficit hyperactivity disorder (ADHD), combined type, severe 04/03/2018   Mild recurrent major depression 04/03/2018   Developmental coordination disorder 04/03/2018   Allergy with anaphylaxis due to food 02/02/2016   Allergic rhinitis due to pollen 02/02/2016    REFERRING DIAG: M79.7 (ICD-10-CM) - Fibromyalgia   THERAPY DIAG:  No diagnosis found.  Rationale for Evaluation and Treatment Rehabilitation  PERTINENT HISTORY: Hypermobility syndrome POTS See above     PRECAUTIONS: Other: dysautonomia, does not pass out but feels intolerable   SUBJECTIVE:                                                                                                                                                                                      SUBJECTIVE  STATEMENT:  "Today is a good day." Patient reports her back feels fine right now. She is noticing more pain in her knees after a work day.   PAIN:  Are you having pain? Yes: NPRS scale: 1/10 Pain location: bilateral lumbar  Pain description: pinch  Aggravating factors: activity (sit to stand, squatting, walking)  Relieving factors: nothing, very min with stretching, resting for 15-30 min will bring pain down when she can lie down    OBJECTIVE: (objective measures completed at initial evaluation unless otherwise dated)  DIAGNOSTIC FINDINGS:  None recent    PATIENT SURVEYS:  FOTO 39% Fatigue Assessment Scale 10/01/22: 29/50      SCREENING FOR  RED FLAGS: Bowel or bladder incontinence: No Spinal tumors: No Cauda equina syndrome: No Compression fracture: No Abdominal aneurysm: No   COGNITION: Overall cognitive status: Within functional limits for tasks assessed                          SENSATION: WFL   MUSCLE LENGTH: Hamstrings: Right tight  deg; Left tight deg Thomas test: Right tight deg; Left tight deg   POSTURE: rounded shoulders and forward head walks with toes in and flexed trunk    PALPATION: Not well tolerated throughout hips and lumbar spine    LUMBAR ROM:    AROM eval  Flexion Pain, ultimately WNL with difficulty, uses 1 arm to support on thigh, knees bent    Extension 50% jerky/spasms   Right lateral flexion WNL   Left lateral flexion WNL   Right rotation WNL  Left rotation WNL  Guarded with motions and prepped with deep breathing    LOWER EXTREMITY ROM:    WNL  Active  Right eval Left eval  Hip flexion      Hip extension      Hip abduction      Hip adduction      Hip internal rotation      Hip external rotation      Knee flexion      Knee extension      Ankle dorsiflexion      Ankle plantarflexion      Ankle inversion      Ankle eversion       (Blank rows = not tested)   LOWER EXTREMITY MMT:   WNL in hip flex, knee ext and flex       MMT Right eval Left eval  Hip flexion      Hip extension      Hip abduction      Hip adduction      Hip internal rotation      Hip external rotation      Knee flexion      Knee extension      Ankle dorsiflexion      Ankle plantarflexion      Ankle inversion      Ankle eversion       (Blank rows = not tested)   LUMBAR SPECIAL TESTS:  Straight leg raise test: Negative, Single leg stance test: Negative, and FABER test: Negative   FUNCTIONAL TESTS:  2 minute walk test: 532 feet, HR started at 115 at rest and HR response was blunted.  True resting was not tested in supine.  POTS   SLS WNL with L LE pain   GAIT: Distance walked: 532 Assistive device utilized: None Level of assistance: Complete Independence Comments: back pain throughout  [facility] Adult PT Treatment:                                                DATE: 10/01/22 Heart rate monitored periodically throughout there ex: 116 bpm, 126 bpm, 119 bpm   Therapeutic Exercise: Recumbent bike level 1 x 5 minutes; start time HR: 70 bpm, end time: 112 bpm  90/90 TA brace 2 x 15 seconds Quadruped TA contraction x 10; 10 sec hold  Quadruped leg extension 2 x 10  SLR x 10 each         PATIENT  EDUCATION:  Education details: FAS Person educated: Patient Education method: Explanation Education comprehension: verbalized understanding   HOME EXERCISE PROGRAM: Access Code: [email or web address] URL: [email or web address] Date: 09/19/2022 Prepared by: [name]   Exercises - Quadruped Transversus Abdominis Bracing  - 2 x daily - 7 x weekly - 2 sets - 10 reps - 5-10 hold - Quadruped Rocking Backward  - 2 x daily - 7 x weekly - 1 sets - 10 reps - 5 hold - Beginner Front Arm Support  - 2 x daily - 7 x weekly - 2 sets - 10 reps - Supine 90/90 Abdominal Bracing  - 2 x daily - 7 x weekly - 1 sets - 3-5 reps - 30 hold   ASSESSMENT:   CLINICAL IMPRESSION: Patient arrives for first PT treatment session reporting that "today is  a good day" with minimal back pain. Heart rate remains stable throughout session with good tolerance to aerobic activity on recumbent bike at beginning of session. Reviewed HEP with patient demonstrating good core activation with basic core stabilization exercises, but quickly fatigues. FAS was captured with LTG set.    OBJECTIVE IMPAIRMENTS: Abnormal gait, decreased activity tolerance, decreased balance, decreased endurance, decreased mobility, difficulty walking, decreased ROM, decreased strength, increased fascial restrictions, increased muscle spasms, impaired flexibility, improper body mechanics, postural dysfunction, and pain.    ACTIVITY LIMITATIONS: carrying, lifting, bending, sitting, standing, squatting, sleeping, transfers, and locomotion level   PARTICIPATION LIMITATIONS: cleaning, interpersonal relationship, shopping, community activity, and occupation   PERSONAL FACTORS: Behavior pattern, Profession, Social background, Time since onset of injury/illness/exacerbation, and 3+ comorbidities: chronic pain, allergies, POTS  are also affecting patient's functional outcome.    REHAB POTENTIAL: Good   CLINICAL DECISION MAKING: Evolving/moderate complexity   EVALUATION COMPLEXITY: Moderate     GOALS: Goals reviewed with patient? Yes   SHORT TERM GOALS: Target date: 10/17/2022       Pt will be I with basic HEP for core, trunk stability at least 3 days per week  Baseline: Goal status: INITIAL   2.  Pt will be able to report no back pain with sit to stand, toilet transfers Baseline:  Goal status: INITIAL   3.  Pt will be able to demo appropriate hip hinge and squat mechanics in order to preserve spine health.  Baseline:  Goal status: INITIAL   4.  Pt will complete FAS and set goal (fatigue assessment scale)  Baseline:  Goal status: met      LONG TERM GOALS: Target date: 11/14/2022     Pt will be I with HEP upon discharge to include core, hips, LE and postural strength   Baseline:  Goal status: INITIAL   2.  Pt will improve FOTO to 57% to demo improved functional mobility Baseline:  Goal status: INITIAL   3.  Pt will be able to attend therapy and also be able to do home tasks, work with min increase in pain/fatigue Baseline: unable to do this  Goal status: INITIAL   4.  Pt will be able to lift 25 lbs from the floor without increased back pain  Baseline:  Goal status: INITIAL   5. Patient will score </= 25/50 on the FAS to signify a clinically meaningful reduction in fatigue.  Baseline: 29 Goal status: INITIAL     PLAN:   PT FREQUENCY: 2x/week   PT DURATION: 8 weeks   PLANNED INTERVENTIONS: Therapeutic exercises, Therapeutic activity, Neuromuscular re-education, Balance training, Gait training, Patient/Family education, Self Care, Joint mobilization, Dry Needling, Electrical stimulation,  Spinal mobilization, Cryotherapy, Moist heat, Manual therapy, and Re-evaluation.   PLAN FOR NEXT SESSION: check HEP, progress as needed.  Include bike, UBE.  Stabilization a priority.  Tools for PNE education. Consider TENS.     Letitia Libra, PT, DPT, ATC 10/03/22 6:28 PM

## 2022-10-04 ENCOUNTER — Ambulatory Visit: Payer: BC Managed Care – PPO | Admitting: Physical Therapy

## 2022-10-08 ENCOUNTER — Ambulatory Visit: Payer: BC Managed Care – PPO | Admitting: Physical Therapy

## 2022-10-08 ENCOUNTER — Encounter: Payer: Self-pay | Admitting: Physical Therapy

## 2022-10-08 DIAGNOSIS — M2550 Pain in unspecified joint: Secondary | ICD-10-CM

## 2022-10-08 DIAGNOSIS — M7918 Myalgia, other site: Secondary | ICD-10-CM

## 2022-10-08 DIAGNOSIS — M6281 Muscle weakness (generalized): Secondary | ICD-10-CM

## 2022-10-08 DIAGNOSIS — G8929 Other chronic pain: Secondary | ICD-10-CM

## 2022-10-08 NOTE — Therapy (Signed)
OUTPATIENT PHYSICAL THERAPY TREATMENT NOTE   Patient Name: Cassandra Schultz MRN: [id number] DOB:August 07, 2000, 22 y.o., female Today's Date: 10/08/2022  PCP: Horton Marshall, PA  REFERRING PROVIDER: Horton Marshall, PA   END OF SESSION:   PT End of Session - 10/08/22 1504     Visit Number 3    Number of Visits 16    Date for PT Re-Evaluation 11/14/22    Authorization Type BC[employer]    PT Start Time 1500    PT Stop Time 1545    PT Time Calculation (min) 45 min    Activity Tolerance Patient tolerated treatment well    Behavior During Therapy WFL for tasks assessed/performed              Past Medical History:  Diagnosis Date   ADHD    Allergy    Anxiety    Depression    reports ADHD    Eczema    Raynaud's syndrome    Tremor of both hands    Past Surgical History:  Procedure Laterality Date   NO PAST SURGERIES     WISDOM TOOTH EXTRACTION     Patient Active Problem List   Diagnosis Date Noted   Pollen-food allergy 08/25/2021   SVT (supraventricular tachycardia) 05/09/2021   Palpitations 04/17/2021   Seasonal and perennial allergic rhinitis 09/21/2020   Seasonal allergic conjunctivitis 09/21/2020   Intrinsic atopic dermatitis 09/21/2020   Numbness and tingling of both legs - comes and goes after activity and onset of pain 08/03/2019   Fatigue 05/27/2019   Low back pain 02/24/2019   Bilateral knee pain 02/24/2019   Bilateral hip pain 02/24/2019   Acute bilateral ankle pain 02/24/2019   Healthcare maintenance 12/23/2018   Generalized anxiety disorder 04/03/2018   Attention deficit hyperactivity disorder (ADHD), combined type, severe 04/03/2018   Mild recurrent major depression 04/03/2018   Developmental coordination disorder 04/03/2018   Allergy with anaphylaxis due to food 02/02/2016   Allergic rhinitis due to pollen 02/02/2016    REFERRING DIAG: M79.7 (ICD-10-CM) - Fibromyalgia   THERAPY DIAG:  Chronic bilateral low back pain without sciatica  Generalized joint  pain  Myalgia, other site  Muscle weakness (generalized)  Rationale for Evaluation and Treatment Rehabilitation  PERTINENT HISTORY: Hypermobility syndrome POTS See above     PRECAUTIONS: Other: dysautonomia, does not pass out but feels intolerable   SUBJECTIVE:                                                                                                                                                                                      SUBJECTIVE STATEMENT: Knees are OK.  I noticed I was rolling inward on  both knees last week.  Back is OK unless I slouch then I feel nerve pain .    PAIN:  Are you having pain? Yes: NPRS scale: 1/10 Pain location: bilateral lumbar  Pain description: pinch  Aggravating factors: activity (sit to stand, squatting, walking)  Relieving factors: nothing, very min with stretching, resting for 15-30 min will bring pain down when she can lie down    OBJECTIVE: (objective measures completed at initial evaluation unless otherwise dated)  DIAGNOSTIC FINDINGS:  None recent    PATIENT SURVEYS:  FOTO 39% Fatigue Assessment Scale 10/01/22: 29/50      SCREENING FOR RED FLAGS: Bowel or bladder incontinence: No Spinal tumors: No Cauda equina syndrome: No Compression fracture: No Abdominal aneurysm: No   COGNITION: Overall cognitive status: Within functional limits for tasks assessed                          SENSATION: WFL   MUSCLE LENGTH: Hamstrings: Right tight  deg; Left tight deg Thomas test: Right tight deg; Left tight deg   POSTURE: rounded shoulders and forward head walks with toes in and flexed trunk    PALPATION: Not well tolerated throughout hips and lumbar spine    LUMBAR ROM:    AROM eval  Flexion Pain, ultimately WNL with difficulty, uses 1 arm to support on thigh, knees bent    Extension 50% jerky/spasms   Right lateral flexion WNL   Left lateral flexion WNL   Right rotation WNL  Left rotation WNL  Guarded with motions  and prepped with deep breathing    LOWER EXTREMITY ROM:    WNL  Active  Right eval Left eval  Hip flexion      Hip extension      Hip abduction      Hip adduction      Hip internal rotation      Hip external rotation      Knee flexion      Knee extension      Ankle dorsiflexion      Ankle plantarflexion      Ankle inversion      Ankle eversion       (Blank rows = not tested)   LOWER EXTREMITY MMT:   WNL in hip flex, knee ext and flex      MMT Right eval Left eval  Hip flexion      Hip extension      Hip abduction      Hip adduction      Hip internal rotation      Hip external rotation      Knee flexion      Knee extension      Ankle dorsiflexion      Ankle plantarflexion      Ankle inversion      Ankle eversion       (Blank rows = not tested)   LUMBAR SPECIAL TESTS:  Straight leg raise test: Negative, Single leg stance test: Negative, and FABER test: Negative   FUNCTIONAL TESTS:  2 minute walk test: 532 feet, HR started at 115 at rest and HR response was blunted.  True resting was not tested in supine.  POTS   SLS WNL with L LE pain   GAIT: Distance walked: 532 Assistive device utilized: None Level of assistance: Complete Independence Comments: back pain throughout   [facility] Adult PT Treatment:  DATE: 10/08/22 Therapeutic Exercise: Sidelying stretch to L trunk/QL  Prone Tr A x 10 , added knee flexion then hip extension  x 10 each , small ROM  Quadruped child's pose forward and then lateral with round back , rocking , each side x 5-6  Supine with ball under pelvis : march , clam, small ROM  Reformer: footwork 2 red 1 blue in parallel, heel and arch  Same as above with Pilates V    Manual Therapy: Sidelying L Quadratus lumborum , Tr P , painful ,not well tolerated  Self Care: Tennis ball in supine for QL release, MFR and Tr P  Scoliosis and leg length discrepancy    [facility] Adult PT Treatment:                                                 DATE: 10/01/22 Heart rate monitored periodically throughout there ex: 116 bpm, 126 bpm, 119 bpm   Therapeutic Exercise: Recumbent bike level 1 x 5 minutes; start time HR: 70 bpm, end time: 112 bpm  90/90 TA brace 2 x 15 seconds Quadruped TA contraction x 10; 10 sec hold  Quadruped leg extension 2 x 10  SLR x 10 each         PATIENT EDUCATION:  Education details: FAS Person educated: Patient Education method: Explanation Education comprehension: verbalized understanding   HOME EXERCISE PROGRAM: Access Code: [email or web address] URL: [email or web address] Date: 09/19/2022 Prepared by: [name]   Exercises - Quadruped Transversus Abdominis Bracing  - 2 x daily - 7 x weekly - 2 sets - 10 reps - 5-10 hold - Quadruped Rocking Backward  - 2 x daily - 7 x weekly - 1 sets - 10 reps - 5 hold - Beginner Front Arm Support  - 2 x daily - 7 x weekly - 2 sets - 10 reps - Supine 90/90 Abdominal Bracing  - 2 x daily - 7 x weekly - 1 sets - 3-5 reps - 30 hold   ASSESSMENT:   CLINICAL IMPRESSION: Patient was able to work on core stability using Pilates props and breathing.  She is very focused and intentional about performance of the exercises.  Left quadratus lumborum was in spasm she did not tolerate manual therapy very well.  Instructed on the use of tennis ball to relieve this tension.  Also noted thoracolumbar scoliosis and mild leg length discrepancy.  Use a heel lift in the right leg to see if that could improve her back pain.  Once the quadratus lumborum is a little less inflamed she may be open to trigger point dry needling.   OBJECTIVE IMPAIRMENTS: Abnormal gait, decreased activity tolerance, decreased balance, decreased endurance, decreased mobility, difficulty walking, decreased ROM, decreased strength, increased fascial restrictions, increased muscle spasms, impaired flexibility, improper body mechanics, postural dysfunction, and pain.    ACTIVITY  LIMITATIONS: carrying, lifting, bending, sitting, standing, squatting, sleeping, transfers, and locomotion level   PARTICIPATION LIMITATIONS: cleaning, interpersonal relationship, shopping, community activity, and occupation   PERSONAL FACTORS: Behavior pattern, Profession, Social background, Time since onset of injury/illness/exacerbation, and 3+ comorbidities: chronic pain, allergies, POTS  are also affecting patient's functional outcome.    REHAB POTENTIAL: Good   CLINICAL DECISION MAKING: Evolving/moderate complexity   EVALUATION COMPLEXITY: Moderate     GOALS: Goals reviewed with patient? Yes   SHORT TERM GOALS: Target date: 10/17/2022  Pt will be I with basic HEP for core, trunk utilizing at least 3 days per week  Baseline: Goal status: INITIAL   2.  Pt will be able to report no back pain with sit to stand, toilet transfers Baseline:  Goal status: INITIAL   3.  Pt will be able to demo appropriate hip hinge and squat mechanics in order to preserve spine health.  Baseline:  Goal status: INITIAL   4.  Pt will complete FAS and set goal (fatigue assessment scale)  Baseline:  Goal status: met      LONG TERM GOALS: Target date: 11/14/2022     Pt will be I with HEP upon discharge to include core, hips, LE and postural strength  Baseline:  Goal status: INITIAL   2.  Pt will improve FOTO to 57% to demo improved functional mobility Baseline:  Goal status: INITIAL   3.  Pt will be able to attend therapy and also be able to do home tasks, work with min increase in pain/fatigue Baseline: unable to do this  Goal status: INITIAL   4.  Pt will be able to lift 25 lbs from the floor without increased back pain  Baseline:  Goal status: INITIAL   5. Patient will score </= 25/50 on the FAS to signify a clinically meaningful reduction in fatigue.  Baseline: 29 Goal status: INITIAL     PLAN:   PT FREQUENCY: 2x/week   PT DURATION: 8 weeks   PLANNED INTERVENTIONS:  Therapeutic exercises, Therapeutic activity, Neuromuscular re-education, Balance training, Gait training, Patient/Family education, Self Care, Joint mobilization, Dry Needling, Electrical stimulation, Spinal mobilization, Cryotherapy, Moist heat, Manual therapy, and Re-evaluation.   PLAN FOR NEXT SESSION: check HEP, progress as needed.  Include bike, UBE.  Stabilization a priority.  Tools for PNE education. Consider TENS.    [name], PT 10/08/22 3:53 PM Phone: [phone] Fax: [phone]

## 2022-10-10 ENCOUNTER — Ambulatory Visit: Payer: BC Managed Care – PPO

## 2022-10-15 ENCOUNTER — Ambulatory Visit: Payer: BC Managed Care – PPO

## 2022-10-15 ENCOUNTER — Encounter (HOSPITAL_BASED_OUTPATIENT_CLINIC_OR_DEPARTMENT_OTHER): Payer: Self-pay

## 2022-10-15 ENCOUNTER — Telehealth: Payer: Self-pay

## 2022-10-15 NOTE — Telephone Encounter (Signed)
Unable to LVM regarding missed PT appointment. Will send MyChart message.   Letitia Libra, PT, DPT, ATC 10/15/22 12:59 PM

## 2022-10-17 ENCOUNTER — Encounter (HOSPITAL_BASED_OUTPATIENT_CLINIC_OR_DEPARTMENT_OTHER): Payer: Self-pay | Admitting: Cardiovascular Disease

## 2022-10-17 ENCOUNTER — Ambulatory Visit (INDEPENDENT_AMBULATORY_CARE_PROVIDER_SITE_OTHER): Payer: BC Managed Care – PPO | Admitting: Cardiovascular Disease

## 2022-10-17 VITALS — BP 115/82 | HR 104 | Ht 69.0 in | Wt 163.0 lb

## 2022-10-17 DIAGNOSIS — G90A Postural orthostatic tachycardia syndrome (POTS): Secondary | ICD-10-CM | POA: Insufficient documentation

## 2022-10-17 HISTORY — DX: Postural orthostatic tachycardia syndrome (POTS): G90.A

## 2022-10-17 NOTE — Assessment & Plan Note (Addendum)
Ms. Texidor has symptoms consistent with POTS.  Her oral intake is poor.  Recommended that she drink at least 2-1/2 L daily.  Also recommend that she increase her sodium intake.  We discussed getting compression socks.  For now, continue propranolol.  If her symptoms or not improving in 2 weeks we will increase this to 20 mg twice daily instead of 10 mg twice daily.  I reviewed her monitor.  She seems to have episodes of inappropriate sinus tachycardia.  If BP does not tolerated higher doses of propranolol, consider ivabradine.  She reports that she is not feeling stressed or anxious lately.  She has a psychiatrist and reports that her anxiety is controlled.  She notes that her poor oral intake is related to restricted eating related to autism. She is interested in having tilt table testing.  Consider referral to [facility].

## 2022-10-17 NOTE — Patient Instructions (Addendum)
Medication Instructions:  IF YOU ARE NOT FEELING BETTER IN 2-3 WEEKS OK TO INCREASE YOUR PROPRANOLOL TO 20 MG TWICE A DAY  CALL OR SEND MYCHART MESSAGE IF YOU INCREASE AND WILL SEND UPDATED PRESCRIPTION TO THE PHARMACY   Labwork: NONE  Testing/Procedures: NONE  Follow-Up: 01/02/2023 9:40 AM WITH DR El Brazil   Any Other Special Instructions Will Be Listed Below (If Applicable).  DRINK 2 & 1/2 LITERS DAILY  MAKE SURE YOU ARE EATING AND DRINKING   INCREASE YOUR SODIUM INTAKE TO 2500 MG DAILY   WEAR COMPRESSION SOCKS DURING THE DAY    If you need a refill on your cardiac medications before your next appointment, please call your pharmacy.

## 2022-10-17 NOTE — Progress Notes (Signed)
Cardiology Office Note:    Date:  10/17/2022   ID:  Cassandra Schultz, DOB 2001/02/10, MRN [id number]  PCP:  Wilfrid Lund, PA   [facility] Providers Cardiologist:  Maisie Fus, MD     Referring MD: Wilfrid Lund, PA   No chief complaint on file.   History of Present Illness:    Cassandra Schultz is a 22 y.o. female with a hx of SVT to palpitations and anxiety, here for follow up. She saw Dr. Wyline Mood 05/2022 and noted that her heart rate increased to the 130-140s while at work. This occurred intermittently since school. She has an ambulatory monitor 04/2021 that reveled episodes of sinus tachycardia up to the 150s. Short run of SVT. Echo in 2021 revealed LVEF 60-65% and was otherwise normal. She was taking propanolol. Office orthostatics were borderline for POTS. Propanol was increased 05/2022 and she was referred to the dysautonomia clinic at [facility].   Today, she is overall well. She reports having constant palpitations since early high school. Prior to starting propranolol her resting heart rate was in the 140-150s and in the low 200s when exercising. Since taking propanolol her resting heart rate is now in the 120-130s and in the 140-150s when exercising. Her heart rate while sleeping is 80s per her fitbit. She reports an episode in January where her at home pulse ox read a heart rate of 233 bpm, which lasted about 8 minutes. She endorses left sided numbness and tingling at the time. She regularly stays active and endorses feeling shortness of breath and dizziness prior to starting propanolol. She drinks a liter of water a day and does not drink any caffeine. She has temperature intolerance and sweats excessively when temperature is above 72 degrees. She reports having ARFID, avoidant/restrictive food intake disorder. She has been taking Adderall for about 4 years and notes her symptoms began before starting Adderall. She  suspects she may have POTS and has tried requesting a table tilt  test. She denies any chest pain, shortness of breath, or peripheral edema. No lightheadedness, headaches, syncope, orthopnea, or PND.  Past Medical History:  Diagnosis Date   ADHD    Allergy    Anxiety    Depression    reports ADHD    Eczema    POTS (postural orthostatic tachycardia syndrome) 10/17/2022   Raynaud's syndrome    Tremor of both hands     Past Surgical History:  Procedure Laterality Date   NO PAST SURGERIES     WISDOM TOOTH EXTRACTION      Current Medications: Current Meds  Medication Sig   amphetamine-dextroamphetamine (ADDERALL) 20 MG tablet Take 2 tablets by mouth daily.   Cholecalciferol (VITAMIN D3) 1.25 MG (50000 UT) CAPS Take 1 capsule by mouth once a week.   drospirenone-ethinyl estradiol (YAZ) 3-0.02 MG tablet Take 1 tablet by mouth daily.   DULoxetine (CYMBALTA) 30 MG capsule Take 30 mg by mouth daily.   EPINEPHrine (AUVI-Q) 0.3 mg/0.3 mL IJ SOAJ injection Inject 0.3 mg into the muscle as needed for anaphylaxis.   omeprazole (PRILOSEC) 40 MG capsule Take 40 mg by mouth. 30 minutes before morning meal daily as needed   ondansetron (ZOFRAN-ODT) 8 MG disintegrating tablet Take 8 mg by mouth every 8 (eight) hours as needed.   propranolol (INDERAL) 10 MG tablet Take 20 mg by mouth daily.     Allergies:   Other, Peanut-containing drug products, Acyclovir and related, Peanut oil, and Amoxicillin-pot clavulanate   Social History  Socioeconomic History   Marital status: Single    Spouse name: Not on file   Number of children: 0   Years of education: Not on file   Highest education level: Some college, no degree  Occupational History   Not on file  Tobacco Use   Smoking status: Never   Smokeless tobacco: Never  Vaping Use   Vaping Use: Never used  Substance and Sexual Activity   Alcohol use: Never   Drug use: Never   Sexual activity: Yes    Birth control/protection: Condom, Pill  Other Topics Concern   Not on file  Social History Narrative    Lives at home with roommate   Caffeine: approx. 25-50 mg some days       ** Merged History Encounter **       Social Determinants of Health   Financial Resource Strain: Not on file  Food Insecurity: No Food Insecurity (10/17/2022)   Hunger Vital Sign    Worried About Running Out of Food in the Last Year: Never true    Ran Out of Food in the Last Year: Never true  Transportation Needs: No Transportation Needs (10/17/2022)   PRAPARE - Administrator, Civil Service (Medical): No    Lack of Transportation (Non-Medical): No  Physical Activity: Inactive (10/17/2022)   Exercise Vital Sign    Days of Exercise per Week: 0 days    Minutes of Exercise per Session: 0 min  Stress: Not on file  Social Connections: Not on file     Family History: The patient's family history includes Alcohol abuse in her maternal grandfather and maternal grandmother; Allergic rhinitis in her father, mother, and paternal aunt; Angina in her maternal grandmother; Arthritis in her mother; Depression in her maternal grandmother and mother; Luiz Blare' disease in her mother; Heart attack in her father; Hypothyroidism in her maternal grandmother; Neuropathy in her father; Other in her paternal grandfather. There is no history of Angioedema, Asthma, Eczema, Immunodeficiency, or Urticaria.  ROS:   Please see the history of present illness.    (+) palpitations All other systems reviewed and are negative.  EKGs/Labs/Other Studies Reviewed:    The following studies were reviewed today:  EKG:    10/17/2022: EKG was not ordered.   Recent Labs: No results found for requested labs within last 365 days.   Recent Lipid Panel    Component Value Date/Time   LDLDIRECT 74 05/27/2019 1344        Physical Exam:    Wt Readings from Last 3 Encounters:  10/17/22 163 lb (73.9 kg)  05/31/22 158 lb 6.4 oz (71.8 kg)  02/01/22 150 lb (68 kg)     VS:  BP 115/82 (BP Location: Left Arm, Patient Position: Sitting, Cuff Size:  Normal)   Pulse (!) 104   Ht 5\' 9"  (1.753 m)   Wt 163 lb (73.9 kg)   BMI 24.07 kg/m  , BMI Body mass index is 24.07 kg/m. GENERAL:  Well appearing HEENT: Pupils equal round and reactive, fundi not visualized, oral mucosa unremarkable NECK:  No jugular venous distention, waveform within normal limits, carotid upstroke brisk and symmetric, no bruits, no thyromegaly LUNGS:  Clear to auscultation bilaterally HEART:  RRR.  PMI not displaced or sustained,S1 and S2 within normal limits, no S3, no S4, no clicks, no rubs, no murmurs ABD:  Flat, positive bowel sounds normal in frequency in pitch, no bruits, no rebound, no guarding, no midline pulsatile mass, no hepatomegaly, no splenomegaly EXT:  2 plus pulses throughout, no edema, no cyanosis no clubbing SKIN:  No rashes no nodules NEURO:  Cranial nerves II through XII grossly intact, motor grossly intact throughout PSYCH:  Cognitively intact, oriented to person place and time   ASSESSMENT:    1. POTS (postural orthostatic tachycardia syndrome)    PLAN:    POTS (postural orthostatic tachycardia syndrome) Ms. Stonehouse has symptoms consistent with POTS.  Her oral intake is poor.  Recommended that she drink at least 2-1/2 L daily.  Also recommend that she increase her sodium intake.  We discussed getting compression socks.  For now, continue propranolol.  If her symptoms or not improving in 2 weeks we will increase this to 20 mg twice daily instead of 10 mg twice daily.  I reviewed her monitor.  She seems to have episodes of inappropriate sinus tachycardia.  If BP does not tolerated higher doses of propranolol, consider ivabradine.  She reports that she is not feeling stressed or anxious lately.  She has a psychiatrist and reports that her anxiety is controlled.  She notes that her poor oral intake is related to restricted eating related to autism. She is interested in having tilt table testing.  Consider referral to [facility].   Disposition: - FU with Supreme Rybarczyk [facility]. [facility] Salvia, MD, [facility] in 2 months   Medication Adjustments/Labs and Tests Ordered: Current medicines are reviewed at length with the patient today.  Concerns regarding medicines are outlined above.   No orders of the defined types were placed in this encounter.  No orders of the defined types were placed in this encounter.  Patient Instructions  Medication Instructions:  IF YOU ARE NOT FEELING BETTER IN 2-3 WEEKS OK TO INCREASE YOUR PROPRANOLOL TO 20 MG TWICE A DAY  CALL OR SEND MYCHART MESSAGE IF YOU INCREASE AND WILL SEND UPDATED PRESCRIPTION TO THE PHARMACY   Labwork: NONE  Testing/Procedures: NONE  Follow-Up: 01/02/2023 9:40 AM WITH DR    Any Other Special Instructions Will Be Listed Below (If Applicable).  DRINK 2 & 1/2 LITERS DAILY  MAKE SURE YOU ARE EATING AND DRINKING   INCREASE YOUR SODIUM INTAKE TO 2500 MG DAILY   WEAR COMPRESSION SOCKS DURING THE DAY    If you need a refill on your cardiac medications before your next appointment, please call your pharmacy.   I,Rachel Rivera,acting as a Neurosurgeon for Chilton Si, MD.,have documented all relevant documentation on the behalf of Chilton Si, MD,as directed by  Chilton Si, MD while in the presence of Chilton Si, MD.  I, Momo Braun [facility]. [facility] Salvia, MD have reviewed all documentation for this visit.  The documentation of the exam, diagnosis, procedures, and orders on 10/17/2022 are all accurate and complete.   Signed, Chilton Si, MD  10/17/2022 5:15 PM    [facility]

## 2022-10-18 ENCOUNTER — Ambulatory Visit: Payer: BC Managed Care – PPO | Attending: Family Medicine

## 2022-10-18 DIAGNOSIS — M2550 Pain in unspecified joint: Secondary | ICD-10-CM | POA: Insufficient documentation

## 2022-10-18 DIAGNOSIS — M6281 Muscle weakness (generalized): Secondary | ICD-10-CM | POA: Insufficient documentation

## 2022-10-18 DIAGNOSIS — M545 Low back pain, unspecified: Secondary | ICD-10-CM

## 2022-10-18 DIAGNOSIS — M7918 Myalgia, other site: Secondary | ICD-10-CM | POA: Insufficient documentation

## 2022-10-18 DIAGNOSIS — G8929 Other chronic pain: Secondary | ICD-10-CM | POA: Diagnosis present

## 2022-10-18 NOTE — Therapy (Addendum)
OUTPATIENT PHYSICAL THERAPY TREATMENT NOTE DISCHARGE   Patient Name: Cassandra Schultz MRN: [id number] DOB:2000-06-21, 22 y.o., female Today's Date: 10/18/2022  PCP: Horton Marshall, PA  REFERRING PROVIDER: Horton Marshall, PA   END OF SESSION:   PT End of Session - 10/18/22 1232     Visit Number 4    Number of Visits 16    Date for PT Re-Evaluation 11/14/22    Authorization Type [employer]    PT Start Time 1232    PT Stop Time 1312    PT Time Calculation (min) 40 min    Activity Tolerance Patient tolerated treatment well;Patient limited by fatigue    Behavior During Therapy [facility] for tasks assessed/performed               Past Medical History:  Diagnosis Date   ADHD    Allergy    Anxiety    Depression    reports ADHD    Eczema    POTS (postural orthostatic tachycardia syndrome) 10/17/2022   Raynaud's syndrome    Tremor of both hands    Past Surgical History:  Procedure Laterality Date   NO PAST SURGERIES     WISDOM TOOTH EXTRACTION     Patient Active Problem List   Diagnosis Date Noted   POTS (postural orthostatic tachycardia syndrome) 10/17/2022   Pollen-food allergy 08/25/2021   SVT (supraventricular tachycardia) 05/09/2021   Palpitations 04/17/2021   Seasonal and perennial allergic rhinitis 09/21/2020   Seasonal allergic conjunctivitis 09/21/2020   Intrinsic atopic dermatitis 09/21/2020   Numbness and tingling of both legs - comes and goes after activity and onset of pain 08/03/2019   Fatigue 05/27/2019   Low back pain 02/24/2019   Bilateral knee pain 02/24/2019   Bilateral hip pain 02/24/2019   Acute bilateral ankle pain 02/24/2019   Healthcare maintenance 12/23/2018   Generalized anxiety disorder 04/03/2018   Attention deficit hyperactivity disorder (ADHD), combined type, severe 04/03/2018   Mild recurrent major depression (HCC) 04/03/2018   Developmental coordination disorder 04/03/2018   Allergy with anaphylaxis due to food 02/02/2016   Allergic rhinitis  due to pollen 02/02/2016    REFERRING DIAG: M79.7 (ICD-10-CM) - Fibromyalgia   THERAPY DIAG:  Chronic bilateral low back pain without sciatica  Generalized joint pain  Myalgia, other site  Muscle weakness (generalized)  Rationale for Evaluation and Treatment Rehabilitation  PERTINENT HISTORY: Hypermobility syndrome POTS See above     PRECAUTIONS: Other: dysautonomia, does not pass out but feels intolerable   SUBJECTIVE:  SUBJECTIVE STATEMENT: "I'm just tired. I slept through the last appointment. No pain right now."    PAIN:  Are you having pain? None currently Yes: NPRS scale: 6 at worst/10 Pain location: bilateral lumbar  Pain description: pinch  Aggravating factors: bending Relieving factors: nothing, very min with stretching, resting for 15-30 min will bring pain down when she can lie down    OBJECTIVE: (objective measures completed at initial evaluation unless otherwise dated)  DIAGNOSTIC FINDINGS:  None recent    PATIENT SURVEYS:  FOTO 39% Fatigue Assessment Scale 10/01/22: 29/50      SCREENING FOR RED FLAGS: Bowel or bladder incontinence: No Spinal tumors: No Cauda equina syndrome: No Compression fracture: No Abdominal aneurysm: No   COGNITION: Overall cognitive status: Within functional limits for tasks assessed                          SENSATION: WFL   MUSCLE LENGTH: Hamstrings: Right tight  deg; Left tight deg Thomas test: Right tight deg; Left tight deg   POSTURE: rounded shoulders and forward head walks with toes in and flexed trunk    PALPATION: Not well tolerated throughout hips and lumbar spine    LUMBAR ROM:    AROM eval  Flexion Pain, ultimately WNL with difficulty, uses 1 arm to support on thigh, knees bent    Extension 50% jerky/spasms   Right  lateral flexion WNL   Left lateral flexion WNL   Right rotation WNL  Left rotation WNL  Guarded with motions and prepped with deep breathing    LOWER EXTREMITY ROM:    WNL  Active  Right eval Left eval  Hip flexion      Hip extension      Hip abduction      Hip adduction      Hip internal rotation      Hip external rotation      Knee flexion      Knee extension      Ankle dorsiflexion      Ankle plantarflexion      Ankle inversion      Ankle eversion       (Blank rows = not tested)   LOWER EXTREMITY MMT:   WNL in hip flex, knee ext and flex      MMT Right eval Left eval  Hip flexion      Hip extension      Hip abduction      Hip adduction      Hip internal rotation      Hip external rotation      Knee flexion      Knee extension      Ankle dorsiflexion      Ankle plantarflexion      Ankle inversion      Ankle eversion       (Blank rows = not tested)   LUMBAR SPECIAL TESTS:  Straight leg raise test: Negative, Single leg stance test: Negative, and FABER test: Negative   FUNCTIONAL TESTS:  2 minute walk test: 532 feet, HR started at 115 at rest and HR response was blunted.  True resting was not tested in supine.  POTS   SLS WNL with L LE pain  10/18/22: Squat: WNL, no back pain   GAIT: Distance walked: 532 Assistive device utilized: None Level of assistance: Complete Independence Comments: back pain throughout  [facility] Adult PT Treatment:  DATE: 10/18/22 Therapeutic Exercise: Recumbent bike level 2 x 5 minutes  Supine TA march 2 x 10  Hip bridge x 10  90/90 toe tap x 6, d/c due to fatigue  90/90 isometric hold 3 x 15 seconds  Sidelying hip abduction 2 x 10  Quadruped alternating arm lift x 10  Hooklying resisted hip abduction blue band 2 x 10  Updated HEP    [facility] Adult PT Treatment:                                                DATE: 10/08/22 Therapeutic Exercise: Sidelying stretch to L trunk/QL  Prone Tr  A x 10 , added knee flexion then hip extension  x 10 each , small ROM  Quadruped child's pose forward and then lateral with round back , rocking , each side x 5-6  Supine with ball under pelvis : march , clam, small ROM  Reformer: footwork 2 red 1 blue in parallel, heel and arch  Same as above with Pilates V    Manual Therapy: Sidelying L Quadratus lumborum , Tr P , painful ,not well tolerated  Self Care: Tennis ball in supine for QL release, MFR and Tr P  Scoliosis and leg length discrepancy    [facility] Adult PT Treatment:                                                DATE: 10/01/22 Heart rate monitored periodically throughout there ex: 116 bpm, 126 bpm, 119 bpm   Therapeutic Exercise: Recumbent bike level 1 x 5 minutes; start time HR: 70 bpm, end time: 112 bpm  90/90 TA brace 2 x 15 seconds Quadruped TA contraction x 10; 10 sec hold  Quadruped leg extension 2 x 10  SLR x 10 each         PATIENT EDUCATION:  Education details: HEP Person educated: Patient Education method: Programmer, multimedia, demo, cues, handout Education comprehension: verbalized understanding, returned demo, cues    HOME EXERCISE PROGRAM: Access Code: [email or web address] URL: [email or web address] Date: 09/19/2022 Prepared by: [name]   Exercises - Quadruped Transversus Abdominis Bracing  - 2 x daily - 7 x weekly - 2 sets - 10 reps - 5-10 hold - Quadruped Rocking Backward  - 2 x daily - 7 x weekly - 1 sets - 10 reps - 5 hold - Beginner Front Arm Support  - 2 x daily - 7 x weekly - 2 sets - 10 reps - Supine 90/90 Abdominal Bracing  - 2 x daily - 7 x weekly - 1 sets - 3-5 reps - 30 hold   ASSESSMENT:   CLINICAL IMPRESSION: Patient arrives without reports of pain, but reports generalized fatigue. Continued with core stabilization and hip mat based strengthening with good tolerance. She quickly fatigues with progression of dynamic core stabilization with inability to complete >6 reps of 90/90 toe taps due  to weakness/fatigue. She demonstrates normalized squat mechanics and reports no pain with sit<> stand transfers having met these short term functional goals. No reports of back pain throughout session.    OBJECTIVE IMPAIRMENTS: Abnormal gait, decreased activity tolerance, decreased balance, decreased endurance, decreased mobility, difficulty walking, decreased ROM, decreased strength, increased fascial restrictions, increased muscle spasms,  impaired flexibility, improper body mechanics, postural dysfunction, and pain.    ACTIVITY LIMITATIONS: carrying, lifting, bending, sitting, standing, squatting, sleeping, transfers, and locomotion level   PARTICIPATION LIMITATIONS: cleaning, interpersonal relationship, shopping, community activity, and occupation   PERSONAL FACTORS: Behavior pattern, Profession, Social background, Time since onset of injury/illness/exacerbation, and 3+ comorbidities: chronic pain, allergies, POTS  are also affecting patient's functional outcome.    REHAB POTENTIAL: Good   CLINICAL DECISION MAKING: Evolving/moderate complexity   EVALUATION COMPLEXITY: Moderate     GOALS: Goals reviewed with patient? Yes   SHORT TERM GOALS: Target date: 10/17/2022       Pt will be I with basic HEP for core, trunk utilizing at least 3 days per week  Baseline: Goal status: met   2.  Pt will be able to report no back pain with sit to stand, toilet transfers Baseline:  10/18/22: no pain with toilet transfers  Goal status: met   3.  Pt will be able to demo appropriate hip hinge and squat mechanics in order to preserve spine health.  Baseline:  Goal status: met   4.  Pt will complete FAS and set goal (fatigue assessment scale)  Baseline:  Goal status: met      LONG TERM GOALS: Target date: 11/14/2022     Pt will be I with HEP upon discharge to include core, hips, LE and postural strength  Baseline:  Goal status: INITIAL   2.  Pt will improve FOTO to 57% to demo improved  functional mobility Baseline:  Goal status: INITIAL   3.  Pt will be able to attend therapy and also be able to do home tasks, work with min increase in pain/fatigue Baseline: unable to do this  Goal status: INITIAL   4.  Pt will be able to lift 25 lbs from the floor without increased back pain  Baseline:  Goal status: INITIAL   5. Patient will score </= 25/50 on the FAS to signify a clinically meaningful reduction in fatigue.  Baseline: 29 Goal status: INITIAL     PLAN:   PT FREQUENCY: 2x/week   PT DURATION: 8 weeks   PLANNED INTERVENTIONS: Therapeutic exercises, Therapeutic activity, Neuromuscular re-education, Balance training, Gait training, Patient/Family education, Self Care, Joint mobilization, Dry Needling, Electrical stimulation, Spinal mobilization, Cryotherapy, Moist heat, Manual therapy, and Re-evaluation.   PLAN FOR NEXT SESSION: check HEP, progress as needed.  Include bike, UBE.  Stabilization a priority.  Tools for PNE education. Consider TENS.   Letitia Libra, PT, DPT, ATC 10/18/22 1:16 PM  PHYSICAL THERAPY DISCHARGE SUMMARY  Visits from Start of Care: 4  Current functional level related to goals / functional outcomes: See above    Remaining deficits: See above    Education / Equipment: Posture, HEP   Patient agrees to discharge. Patient goals were not met. Patient is being discharged due to not returning since the last visit.  [name], PT 11/26/22 10:01 AM Phone: [phone] Fax: [phone]

## 2022-10-22 ENCOUNTER — Ambulatory Visit: Payer: BC Managed Care – PPO | Admitting: Physical Therapy

## 2022-10-24 ENCOUNTER — Ambulatory Visit: Payer: BC Managed Care – PPO | Admitting: Physical Therapy

## 2022-10-24 NOTE — Therapy (Deleted)
OUTPATIENT PHYSICAL THERAPY TREATMENT NOTE   Patient Name: Cassandra Schultz MRN: [id number] DOB:2001/01/03, 22 y.o., female Today's Date: 10/24/2022  PCP: Horton Marshall, PA  REFERRING PROVIDER: Horton Marshall, PA   END OF SESSION:       Past Medical History:  Diagnosis Date   ADHD    Allergy    Anxiety    Depression    reports ADHD    Eczema    POTS (postural orthostatic tachycardia syndrome) 10/17/2022   Raynaud's syndrome    Tremor of both hands    Past Surgical History:  Procedure Laterality Date   NO PAST SURGERIES     WISDOM TOOTH EXTRACTION     Patient Active Problem List   Diagnosis Date Noted   POTS (postural orthostatic tachycardia syndrome) 10/17/2022   Pollen-food allergy 08/25/2021   SVT (supraventricular tachycardia) 05/09/2021   Palpitations 04/17/2021   Seasonal and perennial allergic rhinitis 09/21/2020   Seasonal allergic conjunctivitis 09/21/2020   Intrinsic atopic dermatitis 09/21/2020   Numbness and tingling of both legs - comes and goes after activity and onset of pain 08/03/2019   Fatigue 05/27/2019   Low back pain 02/24/2019   Bilateral knee pain 02/24/2019   Bilateral hip pain 02/24/2019   Acute bilateral ankle pain 02/24/2019   Healthcare maintenance 12/23/2018   Generalized anxiety disorder 04/03/2018   Attention deficit hyperactivity disorder (ADHD), combined type, severe 04/03/2018   Mild recurrent major depression (HCC) 04/03/2018   Developmental coordination disorder 04/03/2018   Allergy with anaphylaxis due to food 02/02/2016   Allergic rhinitis due to pollen 02/02/2016    REFERRING DIAG: M79.7 (ICD-10-CM) - Fibromyalgia   THERAPY DIAG:  No diagnosis found.  Rationale for Evaluation and Treatment Rehabilitation  PERTINENT HISTORY: Hypermobility syndrome POTS See above     PRECAUTIONS: Other: dysautonomia, does not pass out but feels intolerable   SUBJECTIVE:                                                                                                                                                                                       SUBJECTIVE STATEMENT: "I'm just tired. I slept through the last appointment. No pain right now."    PAIN:  Are you having pain? None currently Yes: NPRS scale: 6 at worst/10 Pain location: bilateral lumbar  Pain description: pinch  Aggravating factors: bending Relieving factors: nothing, very min with stretching, resting for 15-30 min will bring pain down when she can lie down    OBJECTIVE: (objective measures completed at initial evaluation unless otherwise dated)  DIAGNOSTIC FINDINGS:  None recent    PATIENT SURVEYS:  FOTO 39% Fatigue Assessment Scale 10/01/22: 29/50  SCREENING FOR RED FLAGS: Bowel or bladder incontinence: No Spinal tumors: No Cauda equina syndrome: No Compression fracture: No Abdominal aneurysm: No   COGNITION: Overall cognitive status: Within functional limits for tasks assessed                          SENSATION: WFL   MUSCLE LENGTH: Hamstrings: Right tight  deg; Left tight deg Thomas test: Right tight deg; Left tight deg   POSTURE: rounded shoulders and forward head walks with toes in and flexed trunk    PALPATION: Not well tolerated throughout hips and lumbar spine    LUMBAR ROM:    AROM eval  Flexion Pain, ultimately WNL with difficulty, uses 1 arm to support on thigh, knees bent    Extension 50% jerky/spasms   Right lateral flexion WNL   Left lateral flexion WNL   Right rotation WNL  Left rotation WNL  Guarded with motions and prepped with deep breathing    LOWER EXTREMITY ROM:    WNL  Active  Right eval Left eval  Hip flexion      Hip extension      Hip abduction      Hip adduction      Hip internal rotation      Hip external rotation      Knee flexion      Knee extension      Ankle dorsiflexion      Ankle plantarflexion      Ankle inversion      Ankle eversion       (Blank rows = not tested)    LOWER EXTREMITY MMT:   WNL in hip flex, knee ext and flex      MMT Right eval Left eval  Hip flexion      Hip extension      Hip abduction      Hip adduction      Hip internal rotation      Hip external rotation      Knee flexion      Knee extension      Ankle dorsiflexion      Ankle plantarflexion      Ankle inversion      Ankle eversion       (Blank rows = not tested)   LUMBAR SPECIAL TESTS:  Straight leg raise test: Negative, Single leg stance test: Negative, and FABER test: Negative   FUNCTIONAL TESTS:  2 minute walk test: 532 feet, HR started at 115 at rest and HR response was blunted.  True resting was not tested in supine.  POTS   SLS WNL with L LE pain  10/18/22: Squat: WNL, no back pain   GAIT: Distance walked: 532 Assistive device utilized: None Level of assistance: Complete Independence Comments: back pain throughout    [facility] Adult PT Treatment:                                                DATE: 10/24/22 Therapeutic Exercise: *** Manual Therapy: *** Neuromuscular re-ed: *** Therapeutic Activity: *** Modalities: *** Self Care: Cassandra Schultz Adult PT Treatment:  DATE: 10/18/22 Therapeutic Exercise: Recumbent bike level 2 x 5 minutes  Supine TA march 2 x 10  Hip bridge x 10  90/90 toe tap x 6, d/c due to fatigue  90/90 isometric hold 3 x 15 seconds  Sidelying hip abduction 2 x 10  Quadruped alternating arm lift x 10  Hooklying resisted hip abduction blue band 2 x 10  Updated HEP    [facility] Adult PT Treatment:                                                DATE: 10/08/22 Therapeutic Exercise: Sidelying stretch to L trunk/QL  Prone Tr A x 10 , added knee flexion then hip extension  x 10 each , small ROM  Quadruped child's pose forward and then lateral with round back , rocking , each side x 5-6  Supine with ball under pelvis : march , clam, small ROM  Reformer: footwork 2 red 1 blue in parallel, heel and  arch  Same as above with Pilates V    Manual Therapy: Sidelying L Quadratus lumborum , Tr P , painful ,not well tolerated  Self Care: Tennis ball in supine for QL release, MFR and Tr P  Scoliosis and leg length discrepancy    [facility] Adult PT Treatment:                                                DATE: 10/01/22 Heart rate monitored periodically throughout there ex: 116 bpm, 126 bpm, 119 bpm   Therapeutic Exercise: Recumbent bike level 1 x 5 minutes; start time HR: 70 bpm, end time: 112 bpm  90/90 TA brace 2 x 15 seconds Quadruped TA contraction x 10; 10 sec hold  Quadruped leg extension 2 x 10  SLR x 10 each         PATIENT EDUCATION:  Education details: HEP Person educated: Patient Education method: Programmer, multimedia, demo, cues, handout Education comprehension: verbalized understanding, returned demo, cues    HOME EXERCISE PROGRAM: Access Code: [email or web address] URL: [email or web address] Date: 09/19/2022 Prepared by: [name]   Exercises - Quadruped Transversus Abdominis Bracing  - 2 x daily - 7 x weekly - 2 sets - 10 reps - 5-10 hold - Quadruped Rocking Backward  - 2 x daily - 7 x weekly - 1 sets - 10 reps - 5 hold - Beginner Front Arm Support  - 2 x daily - 7 x weekly - 2 sets - 10 reps - Supine 90/90 Abdominal Bracing  - 2 x daily - 7 x weekly - 1 sets - 3-5 reps - 30 hold   ASSESSMENT:   CLINICAL IMPRESSION: Patient arrives without reports of pain, but reports generalized fatigue. Continued with core stabilization and hip mat based strengthening with good tolerance. She quickly fatigues with progression of dynamic core stabilization with inability to complete >6 reps of 90/90 toe taps due to weakness/fatigue. She demonstrates normalized squat mechanics and reports no pain with sit<> stand transfers having met these short term functional goals. No reports of back pain throughout session.    OBJECTIVE IMPAIRMENTS: Abnormal gait, decreased activity tolerance,  decreased balance, decreased endurance, decreased mobility, difficulty walking, decreased ROM, decreased strength, increased fascial restrictions, increased muscle spasms,  impaired flexibility, improper body mechanics, postural dysfunction, and pain.    ACTIVITY LIMITATIONS: carrying, lifting, bending, sitting, standing, squatting, sleeping, transfers, and locomotion level   PARTICIPATION LIMITATIONS: cleaning, interpersonal relationship, shopping, community activity, and occupation   PERSONAL FACTORS: Behavior pattern, Profession, Social background, Time since onset of injury/illness/exacerbation, and 3+ comorbidities: chronic pain, allergies, POTS  are also affecting patient's functional outcome.    REHAB POTENTIAL: Good   CLINICAL DECISION MAKING: Evolving/moderate complexity   EVALUATION COMPLEXITY: Moderate     GOALS: Goals reviewed with patient? Yes   SHORT TERM GOALS: Target date: 10/17/2022       Pt will be I with basic HEP for core, trunk utilizing at least 3 days per week  Baseline: Goal status: met   2.  Pt will be able to report no back pain with sit to stand, toilet transfers Baseline:  10/18/22: no pain with toilet transfers  Goal status: met   3.  Pt will be able to demo appropriate hip hinge and squat mechanics in order to preserve spine health.  Baseline:  Goal status: met   4.  Pt will complete FAS and set goal (fatigue assessment scale)  Baseline:  Goal status: met      LONG TERM GOALS: Target date: 11/14/2022     Pt will be I with HEP upon discharge to include core, hips, LE and postural strength  Baseline:  Goal status: INITIAL   2.  Pt will improve FOTO to 57% to demo improved functional mobility Baseline:  Goal status: INITIAL   3.  Pt will be able to attend therapy and also be able to do home tasks, work with min increase in pain/fatigue Baseline: unable to do this  Goal status: INITIAL   4.  Pt will be able to lift 25 lbs from the floor  without increased back pain  Baseline:  Goal status: INITIAL   5. Patient will score </= 25/50 on the FAS to signify a clinically meaningful reduction in fatigue.  Baseline: 29 Goal status: INITIAL     PLAN:   PT FREQUENCY: 2x/week   PT DURATION: 8 weeks   PLANNED INTERVENTIONS: Therapeutic exercises, Therapeutic activity, Neuromuscular re-education, Balance training, Gait training, Patient/Family education, Self Care, Joint mobilization, Dry Needling, Electrical stimulation, Spinal mobilization, Cryotherapy, Moist heat, Manual therapy, and Re-evaluation.   PLAN FOR NEXT SESSION: check HEP, progress as needed.  Include bike, UBE.  Stabilization a priority.  Tools for PNE education. Consider TENS.   Letitia Libra, PT, DPT, ATC 10/24/22 7:52 AM

## 2022-12-04 ENCOUNTER — Other Ambulatory Visit: Payer: Self-pay | Admitting: *Deleted

## 2022-12-04 ENCOUNTER — Telehealth: Payer: Self-pay | Admitting: *Deleted

## 2022-12-04 NOTE — Telephone Encounter (Signed)
Patient called asking for a refill for Carbinoxamine to be sent in to the [facility] on [address] while I was in the injection room. I took a note to do the refill when I had a moment. I looked into the patients chart and realized the last medication she was prescribed was Xyzal and that the patient has not been seen in over a year. The patient can receive a courtesy refill but does need an appointment. I called and left a voicemail asking for the patient to return call to discuss.

## 2022-12-10 NOTE — Patient Instructions (Incomplete)
Allergic rhinitis- not well controlled Continue allergen avoidance measures directed toward pollen, pets, dust mite, and mold as listed below Please call and ask for Doreen Beam in our billing department and discuss your being billed a co-pay for allergy injections. She stopped allergy injections due to being charged a co-pay everytime she gets an allergy injection Start carbinoxamine 4 mg tablets taking one to two tablets twice a day as needed for runny nose. Caution as this may make you drowsy. Continue to rotate between antihistamines Continue saline nasal rinses as needed for nasal symptoms. Use this before any medicated nasal sprays for best result May use nasal saline gel as needed for dry nostrils  Allergic conjunctivitis Continue Opcon-A eyedrops 1 to 2 drops in each eye up to 4 times a day as needed.  Some other over the counter eye drops include Pataday one drop in each eye once a day as needed for red, itchy eyes OR Zaditor one drop in each eye twice a day as needed for red itchy eyes.  Atopic dermatitis Continue with twice a day moisturizing routine Continue Eucrisa to red itchy areas up to twice a day as needed For stubborn red itchy areas below your face, continue triamcinolone 0.1% ointment up to twice a day as needed.  Do not use this medication for longer than 3 weeks in a row  Food allergy Continue to avoid soy, peanut, and tree nuts.  In case of an allergic reaction, take Benadryl 50 mg every 4 hours, and if life-threatening symptoms occur, inject with Auvi-Q 0.3 mg.Refill sent  Oral allergy syndrome The oral allergy syndrome (OAS) or pollen-food allergy syndrome (PFAS) is a relatively common form of food allergy, particularly in adults. It typically occurs in people who have pollen allergies when the immune system "sees" proteins on the food that look like proteins on the pollen. This results in the allergy antibody (IgE) binding to the food instead of the pollen. Patients  typically report itching and/or mild swelling of the mouth and throat immediately following ingestion of certain uncooked fruits (including nuts) or raw vegetables. Only a very small number of affected individuals experience systemic allergic reactions, such as anaphylaxis which occurs with true food allergies.      Call the clinic if this treatment plan is not working well for you.  Follow up in 6 months or sooner if needed.  Reducing Pollen Exposure The American Academy of Allergy, Asthma and Immunology suggests the following steps to reduce your exposure to pollen during allergy seasons. Do not hang sheets or clothing out to dry; pollen may collect on these items. Do not mow lawns or spend time around freshly cut grass; mowing stirs up pollen. Keep windows closed at night.  Keep car windows closed while driving. Minimize morning activities outdoors, a time when pollen counts are usually at their highest. Stay indoors as much as possible when pollen counts or humidity is high and on windy days when pollen tends to remain in the air longer. Use air conditioning when possible.  Many air conditioners have filters that trap the pollen spores. Use a HEPA room air filter to remove pollen form the indoor air you breathe.  Control of Dog or Cat Allergen Avoidance is the best way to manage a dog or cat allergy. If you have a dog or cat and are allergic to dog or cats, consider removing the dog or cat from the home. If you have a dog or cat but don't want to find  it a new home, or if your family wants a pet even though someone in the household is allergic, here are some strategies that may help keep symptoms at bay:  Keep the pet out of your bedroom and restrict it to only a few rooms. Be advised that keeping the dog or cat in only one room will not limit the allergens to that room. Don't pet, hug or kiss the dog or cat; if you do, wash your hands with soap and water. High-efficiency particulate air  (HEPA) cleaners run continuously in a bedroom or living room can reduce allergen levels over time. Regular use of a high-efficiency vacuum cleaner or a central vacuum can reduce allergen levels. Giving your dog or cat a bath at least once a week can reduce airborne allergen.  Control of Mold Allergen Mold and fungi can grow on a variety of surfaces provided certain temperature and moisture conditions exist.  Outdoor molds grow on plants, decaying vegetation and soil.  The major outdoor mold, Alternaria and Cladosporium, are found in very high numbers during hot and dry conditions.  Generally, a late Summer - Fall peak is seen for common outdoor fungal spores.  Rain will temporarily lower outdoor mold spore count, but counts rise rapidly when the rainy period ends.  The most important indoor molds are Aspergillus and Penicillium.  Dark, humid and poorly ventilated basements are ideal sites for mold growth.  The next most common sites of mold growth are the bathroom and the kitchen.  Outdoor [employer] Use air conditioning and keep windows closed Avoid exposure to decaying vegetation. Avoid leaf raking. Avoid grain handling. Consider wearing a face mask if working in moldy areas.  Indoor Mold Control Maintain humidity below 50%. Clean washable surfaces with 5% bleach solution. Remove sources e.g. Contaminated carpets.   Control of Dust Mite Allergen Dust mites play a major role in allergic asthma and rhinitis. They occur in environments with high humidity wherever human skin is found. Dust mites absorb humidity from the atmosphere (ie, they do not drink) and feed on organic matter (including shed human and animal skin). Dust mites are a microscopic type of insect that you cannot see with the naked eye. High levels of dust mites have been detected from mattresses, pillows, carpets, upholstered furniture, bed covers, clothes, soft toys and any woven material. The principal allergen of the dust  mite is found in its feces. A gram of dust may contain 1,000 mites and 250,000 fecal particles. Mite antigen is easily measured in the air during house cleaning activities. Dust mites do not bite and do not cause harm to humans, other than by triggering allergies/asthma.  Ways to decrease your exposure to dust mites in your home:  1. Encase mattresses, box springs and pillows with a mite-impermeable barrier or cover  2. Wash sheets, blankets and drapes weekly in hot water (130 F) with detergent and dry them in a dryer on the hot setting.  3. Have the room cleaned frequently with a vacuum cleaner and a damp dust-mop. For carpeting or rugs, vacuuming with a vacuum cleaner equipped with a high-efficiency particulate air (HEPA) filter. The dust mite allergic individual should not be in a room which is being cleaned and should wait 1 hour after cleaning before going into the room.  4. Do not sleep on upholstered furniture (eg, couches).  5. If possible removing carpeting, upholstered furniture and drapery from the home is ideal. Horizontal blinds should be eliminated in the rooms where  the person spends the most time (bedroom, study, television room). Washable vinyl, roller-type shades are optimal.  6. Remove all non-washable stuffed toys from the bedroom. Wash stuffed toys weekly like sheets and blankets above.  7. Reduce indoor humidity to less than 50%. Inexpensive humidity monitors can be purchased at most hardware stores. Do not use a humidifier as can make the problem worse and are not recommended.  Oral allergy syndrome

## 2022-12-10 NOTE — Telephone Encounter (Signed)
Patient made an appointment for refills with Amada Jupiter at 3pm on 12/11/22

## 2022-12-11 ENCOUNTER — Encounter: Payer: Self-pay | Admitting: Family

## 2022-12-11 ENCOUNTER — Other Ambulatory Visit: Payer: Self-pay

## 2022-12-11 ENCOUNTER — Ambulatory Visit (INDEPENDENT_AMBULATORY_CARE_PROVIDER_SITE_OTHER): Payer: BC Managed Care – PPO | Admitting: Family

## 2022-12-11 VITALS — BP 118/80 | HR 92 | Temp 98.1°F | Resp 18 | Ht 68.5 in | Wt 157.6 lb

## 2022-12-11 DIAGNOSIS — L2084 Intrinsic (allergic) eczema: Secondary | ICD-10-CM

## 2022-12-11 DIAGNOSIS — H1013 Acute atopic conjunctivitis, bilateral: Secondary | ICD-10-CM

## 2022-12-11 DIAGNOSIS — J302 Other seasonal allergic rhinitis: Secondary | ICD-10-CM | POA: Diagnosis not present

## 2022-12-11 DIAGNOSIS — T7800XA Anaphylactic reaction due to unspecified food, initial encounter: Secondary | ICD-10-CM

## 2022-12-11 DIAGNOSIS — J3089 Other allergic rhinitis: Secondary | ICD-10-CM

## 2022-12-11 DIAGNOSIS — H1010 Acute atopic conjunctivitis, unspecified eye: Secondary | ICD-10-CM

## 2022-12-11 DIAGNOSIS — T7800XD Anaphylactic reaction due to unspecified food, subsequent encounter: Secondary | ICD-10-CM

## 2022-12-11 DIAGNOSIS — T781XXD Other adverse food reactions, not elsewhere classified, subsequent encounter: Secondary | ICD-10-CM

## 2022-12-11 MED ORDER — EPINEPHRINE 0.3 MG/0.3ML IJ SOAJ: 0.3000 mg | INTRAMUSCULAR | 1 refills | Status: DC | PRN

## 2022-12-11 MED ORDER — CARBINOXAMINE MALEATE 4 MG PO TABS: ORAL_TABLET | ORAL | 5 refills | Status: DC

## 2022-12-11 NOTE — Progress Notes (Signed)
[address] [place] [address] Dept: [phone]  FOLLOW UP NOTE  Patient ID: Cassandra Schultz, female    DOB: 09/20/2000  Age: 22 y.o. MRN: [id number] Date of Office Visit: 12/11/2022  Assessment  Chief Complaint: Allergic Rhinitis  (No change in symptoms )  HPI Cassandra Schultz is a 22 year old female who presents today for follow-up of seasonal and perennial allergic rhinitis, seasonal allergic conjunctivitis, intrinsic atopic dermatitis, allergy with anaphylaxis due to food, and pollen food allergy.  She was last seen on August 25, 2021 by Thermon Leyland, FNP.  She denies any new diagnosis or surgery since her last office visit.  Seasonal and perennial allergic rhinitis: She is no longer receiving allergy injections because she is getting billed for immunotherapy and she already owes a big bill.  She reports that she was getting charged a co-pay every time that she received an allergy injection.  She is currently requesting a refill of carbinoxamine.  She reports that she used to rotate between Xyzal and Allegra, but feels like she has build up a tolerance to this.  She currently is not using Xhance and does not want to use any nasal sprays because she does not like them and she cannot tell an improvement in symptoms when she uses them.  She reports constant clear rhinorrhea and nasal congestion.  She also has a tiny bit of postnasal drip.  She has been treated for 1 sinus infection since her last office visit and this was a month ago.  Allergic conjunctivitis: She reports she will have itchy eyes at times for which she has Opcon-A eyedrops and it helps.  Atopic dermatitis: She reports that her eczema got bad in the spring, but she used the samples of Eucrisa and it helped.  She reports her eczema usually flares on the right upper back of her leg.  She does not wish for prescription or samples of Eucrisa at this time.  Food allergy: She continues to try to avoid soy, peanuts, and tree  nuts.  In August 2023 she did use her epinephrine autoinjector device and went to the emergency room after accidentally eating ice cream with cashews in it.  She developed tongue, throat, and lip swelling.  Oral allergy syndrome: She continues to not eat uncooked fruits or vegetables.   Drug Allergies:  Allergies  Allergen Reactions   Other Anaphylaxis and Other (See Comments)    All tree nuts, seasonal allergies dust, mold, pollen, horses, cockroaches, cats   Peanut-Containing Drug Products Anaphylaxis   Acyclovir And Related    Peanut Oil Other (See Comments)   Amoxicillin-Pot Clavulanate Hives and Other (See Comments)    Patient says she has no allergies to antibiotics "As far as I know"    Review of Systems: Review of Systems  Constitutional:  Negative for chills and fever.  HENT:         Reports clear rhinorrhea and nasal congestion that is constant.  She reports a tiny bit of postnasal drip.  Eyes:        Reports itchy watery eyes at times.  Has Opcon-A drops and this helps  Respiratory:  Negative for cough, shortness of breath and wheezing.   Cardiovascular:  Negative for chest pain and palpitations.  Gastrointestinal:        Denies heartburn or reflux symptoms  Skin:  Negative for itching and rash.  Neurological:  Negative for headaches.  Endo/Heme/Allergies:  Positive for environmental allergies.     Physical Exam: BP  118/80   Pulse 92   Temp 98.1 F (36.7 C)   Resp 18   Ht 5' 8.5" (1.74 m)   Wt 157 lb 9.6 oz (71.5 kg)   SpO2 98%   BMI 23.61 kg/m    Physical Exam Constitutional:      Appearance: Normal appearance.  HENT:     Head: Normocephalic and atraumatic.     Comments: Pharynx normal, eyes normal, ears normal, nose: Bilateral lower turbinates mildly edematous with no drainage noted    Right Ear: Tympanic membrane, ear canal and external ear normal.     Left Ear: Tympanic membrane, ear canal and external ear normal.     Mouth/Throat:     Mouth: Mucous  membranes are moist.     Pharynx: Oropharynx is clear.  Eyes:     Conjunctiva/sclera: Conjunctivae normal.  Cardiovascular:     Rate and Rhythm: Regular rhythm.     Heart sounds: Normal heart sounds.  Pulmonary:     Effort: Pulmonary effort is normal.     Breath sounds: Normal breath sounds.     Comments: Lungs clear to auscultation Musculoskeletal:     Cervical back: Neck supple.  Skin:    General: Skin is warm.     Comments: Multiple horizontal scars noted on bilateral upper arms and upper legs.  Multiple slight erythematous areas noted on back upper right  leg  Neurological:     Mental Status: She is alert and oriented to person, place, and time.  Psychiatric:        Mood and Affect: Mood normal.        Behavior: Behavior normal.        Thought Content: Thought content normal.        Judgment: Judgment normal.     Diagnostics:  None  Assessment and Plan: 1. Seasonal and perennial allergic rhinitis   2. Seasonal allergic conjunctivitis   3. Intrinsic atopic dermatitis   4. Allergy with anaphylaxis due to food   5. Pollen-food allergy, subsequent encounter     Meds ordered this encounter  Medications   Carbinoxamine Maleate 4 MG TABS    Sig: Take one to two tablets twice  as needed for runny nose    Dispense:  120 tablet    Refill:  5   EPINEPHrine (AUVI-Q) 0.3 mg/0.3 mL IJ SOAJ injection    Sig: Inject 0.3 mg into the muscle as needed for anaphylaxis.    Dispense:  2 each    Refill:  1    Phone number [phone]    Patient Instructions  Allergic rhinitis- not well controlled Continue allergen avoidance measures directed toward pollen, pets, dust mite, and mold as listed below Please call and ask for Doreen Beam in our billing department and discuss your being billed a co-pay for allergy injections. She stopped allergy injections due to being charged a co-pay everytime she gets an allergy injection Start carbinoxamine 4 mg tablets taking one to two tablets  twice a day as needed for runny nose. Caution as this may make you drowsy. Continue to rotate between antihistamines Continue saline nasal rinses as needed for nasal symptoms. Use this before any medicated nasal sprays for best result May use nasal saline gel as needed for dry nostrils  Allergic conjunctivitis Continue Opcon-A eyedrops 1 to 2 drops in each eye up to 4 times a day as needed.  Some other over the counter eye drops include Pataday one drop in each eye once a day  as needed for red, itchy eyes OR Zaditor one drop in each eye twice a day as needed for red itchy eyes.  Atopic dermatitis Continue with twice a day moisturizing routine Continue Eucrisa to red itchy areas up to twice a day as needed For stubborn red itchy areas below your face, continue triamcinolone 0.1% ointment up to twice a day as needed.  Do not use this medication for longer than 3 weeks in a row  Food allergy Continue to avoid soy, peanut, and tree nuts.  In case of an allergic reaction, take Benadryl 50 mg every 4 hours, and if life-threatening symptoms occur, inject with Auvi-Q 0.3 mg.Refill sent  Oral allergy syndrome The oral allergy syndrome (OAS) or pollen-food allergy syndrome (PFAS) is a relatively common form of food allergy, particularly in adults. It typically occurs in people who have pollen allergies when the immune system "sees" proteins on the food that look like proteins on the pollen. This results in the allergy antibody (IgE) binding to the food instead of the pollen. Patients typically report itching and/or mild swelling of the mouth and throat immediately following ingestion of certain uncooked fruits (including nuts) or raw vegetables. Only a very small number of affected individuals experience systemic allergic reactions, such as anaphylaxis which occurs with true food allergies.      Call the clinic if this treatment plan is not working well for you.  Follow up in 6 months or sooner if  needed.  Reducing Pollen Exposure The American Academy of Allergy, Asthma and Immunology suggests the following steps to reduce your exposure to pollen during allergy seasons. Do not hang sheets or clothing out to dry; pollen may collect on these items. Do not mow lawns or spend time around freshly cut grass; mowing stirs up pollen. Keep windows closed at night.  Keep car windows closed while driving. Minimize morning activities outdoors, a time when pollen counts are usually at their highest. Stay indoors as much as possible when pollen counts or humidity is high and on windy days when pollen tends to remain in the air longer. Use air conditioning when possible.  Many air conditioners have filters that trap the pollen spores. Use a HEPA room air filter to remove pollen form the indoor air you breathe.  Control of Dog or Cat Allergen Avoidance is the best way to manage a dog or cat allergy. If you have a dog or cat and are allergic to dog or cats, consider removing the dog or cat from the home. If you have a dog or cat but don't want to find it a new home, or if your family wants a pet even though someone in the household is allergic, here are some strategies that may help keep symptoms at bay:  Keep the pet out of your bedroom and restrict it to only a few rooms. Be advised that keeping the dog or cat in only one room will not limit the allergens to that room. Don't pet, hug or kiss the dog or cat; if you do, wash your hands with soap and water. High-efficiency particulate air (HEPA) cleaners run continuously in a bedroom or living room can reduce allergen levels over time. Regular use of a high-efficiency vacuum cleaner or a central vacuum can reduce allergen levels. Giving your dog or cat a bath at least once a week can reduce airborne allergen.  Control of Mold Allergen Mold and fungi can grow on a variety of surfaces provided certain temperature and moisture  conditions exist.  Outdoor  molds grow on plants, decaying vegetation and soil.  The major outdoor mold, Alternaria and Cladosporium, are found in very high numbers during hot and dry conditions.  Generally, a late Summer - Fall peak is seen for common outdoor fungal spores.  Rain will temporarily lower outdoor mold spore count, but counts rise rapidly when the rainy period ends.  The most important indoor molds are Aspergillus and Penicillium.  Dark, humid and poorly ventilated basements are ideal sites for mold growth.  The next most common sites of mold growth are the bathroom and the kitchen.  Outdoor [employer] Use air conditioning and keep windows closed Avoid exposure to decaying vegetation. Avoid leaf raking. Avoid grain handling. Consider wearing a face mask if working in moldy areas.  Indoor Mold Control Maintain humidity below 50%. Clean washable surfaces with 5% bleach solution. Remove sources e.g. Contaminated carpets.   Control of Dust Mite Allergen Dust mites play a major role in allergic asthma and rhinitis. They occur in environments with high humidity wherever human skin is found. Dust mites absorb humidity from the atmosphere (ie, they do not drink) and feed on organic matter (including shed human and animal skin). Dust mites are a microscopic type of insect that you cannot see with the naked eye. High levels of dust mites have been detected from mattresses, pillows, carpets, upholstered furniture, bed covers, clothes, soft toys and any woven material. The principal allergen of the dust mite is found in its feces. A gram of dust may contain 1,000 mites and 250,000 fecal particles. Mite antigen is easily measured in the air during house cleaning activities. Dust mites do not bite and do not cause harm to humans, other than by triggering allergies/asthma.  Ways to decrease your exposure to dust mites in your home:  1. Encase mattresses, box springs and pillows with a mite-impermeable barrier or  cover  2. Wash sheets, blankets and drapes weekly in hot water (130 F) with detergent and dry them in a dryer on the hot setting.  3. Have the room cleaned frequently with a vacuum cleaner and a damp dust-mop. For carpeting or rugs, vacuuming with a vacuum cleaner equipped with a high-efficiency particulate air (HEPA) filter. The dust mite allergic individual should not be in a room which is being cleaned and should wait 1 hour after cleaning before going into the room.  4. Do not sleep on upholstered furniture (eg, couches).  5. If possible removing carpeting, upholstered furniture and drapery from the home is ideal. Horizontal blinds should be eliminated in the rooms where the person spends the most time (bedroom, study, television room). Washable vinyl, roller-type shades are optimal.  6. Remove all non-washable stuffed toys from the bedroom. Wash stuffed toys weekly like sheets and blankets above.  7. Reduce indoor humidity to less than 50%. Inexpensive humidity monitors can be purchased at most hardware stores. Do not use a humidifier as can make the problem worse and are not recommended.  Oral allergy syndrome   Return in about 6 months (around 06/12/2023), or if symptoms worsen or fail to improve.    Thank you for the opportunity to care for this patient.  Please do not hesitate to contact me with questions.  Nehemiah Settle, FNP [facility]

## 2022-12-17 ENCOUNTER — Emergency Department (HOSPITAL_COMMUNITY)
Admission: EM | Admit: 2022-12-17 | Discharge: 2022-12-18 | Disposition: A | Discharge status: Home / Self Care | Payer: BC Managed Care – PPO | Attending: Emergency Medicine | Admitting: Emergency Medicine

## 2022-12-17 ENCOUNTER — Other Ambulatory Visit: Payer: Self-pay

## 2022-12-17 ENCOUNTER — Encounter (HOSPITAL_COMMUNITY): Payer: Self-pay

## 2022-12-17 DIAGNOSIS — M549 Dorsalgia, unspecified: Secondary | ICD-10-CM | POA: Diagnosis present

## 2022-12-17 DIAGNOSIS — Z91010 Allergy to peanuts: Secondary | ICD-10-CM | POA: Diagnosis not present

## 2022-12-17 DIAGNOSIS — M62830 Muscle spasm of back: Secondary | ICD-10-CM | POA: Diagnosis not present

## 2022-12-17 NOTE — ED Triage Notes (Signed)
Pt arrived from home via POV c/o back pain down her spine from neck to tail bone. Pt states that she has chronic pain and usually drinks 5 or 6 shots of 80 proof to help her sleep, but that did not touch the pain today.

## 2022-12-18 LAB — CBC WITH DIFFERENTIAL/PLATELET
Abs Immature Granulocytes: 0.03 10*3/uL (ref 0.00–0.07)
Basophils Absolute: 0 10*3/uL (ref 0.0–0.1)
Basophils Relative: 0 %
Eosinophils Absolute: 0.1 10*3/uL (ref 0.0–0.5)
Eosinophils Relative: 1 %
HCT: 36.8 % (ref 36.0–46.0)
Hemoglobin: 12.7 g/dL (ref 12.0–15.0)
Immature Granulocytes: 0 %
Lymphocytes Relative: 19 %
Lymphs Abs: 1.7 10*3/uL (ref 0.7–4.0)
MCH: 34.7 pg — ABNORMAL HIGH (ref 26.0–34.0)
MCHC: 34.5 g/dL (ref 30.0–36.0)
MCV: 100.5 fL — ABNORMAL HIGH (ref 80.0–100.0)
Monocytes Absolute: 0.9 10*3/uL (ref 0.1–1.0)
Monocytes Relative: 11 %
Neutro Abs: 5.8 10*3/uL (ref 1.7–7.7)
Neutrophils Relative %: 69 %
Platelets: 220 10*3/uL (ref 150–400)
RBC: 3.66 MIL/uL — ABNORMAL LOW (ref 3.87–5.11)
RDW: 12.2 % (ref 11.5–15.5)
WBC: 8.5 10*3/uL (ref 4.0–10.5)
nRBC: 0 % (ref 0.0–0.2)

## 2022-12-18 LAB — COMPREHENSIVE METABOLIC PANEL
ALT: 13 U/L (ref 0–44)
AST: 17 U/L (ref 15–41)
Albumin: 3.7 g/dL (ref 3.5–5.0)
Alkaline Phosphatase: 59 U/L (ref 38–126)
Anion gap: 8 (ref 5–15)
BUN: 7 mg/dL (ref 6–20)
CO2: 24 mmol/L (ref 22–32)
Calcium: 8.5 mg/dL — ABNORMAL LOW (ref 8.9–10.3)
Chloride: 107 mmol/L (ref 98–111)
Creatinine, Ser: 0.82 mg/dL (ref 0.44–1.00)
GFR, Estimated: >60 mL/min (ref >60)
Glucose, Bld: 79 mg/dL (ref 70–99)
Potassium: 4.6 mmol/L (ref 3.5–5.1)
Sodium: 139 mmol/L (ref 135–145)
Total Bilirubin: 0.4 mg/dL (ref 0.3–1.2)
Total Protein: 6.2 g/dL — ABNORMAL LOW (ref 6.5–8.1)

## 2022-12-18 LAB — ETHANOL: Alcohol, Ethyl (B): <10 mg/dL (ref <10)

## 2022-12-18 MED ORDER — METHOCARBAMOL 500 MG PO TABS
500.0000 mg | ORAL_TABLET | Freq: Once | ORAL | Status: AC
  Administered 2022-12-18: 500 mg via ORAL
  Filled 2022-12-18: qty 1

## 2022-12-18 MED ORDER — METHOCARBAMOL 500 MG PO TABS: 500.0000 mg | ORAL_TABLET | Freq: Two times a day (BID) | ORAL | 0 refills | Status: DC

## 2022-12-18 NOTE — ED Provider Notes (Signed)
[place] EMERGENCY DEPARTMENT AT [facility] Provider Note   CSN: [id number] Arrival date & time: 12/17/22  2243     History  Chief Complaint  Patient presents with   Back Pain    Cassandra Schultz is a 22 y.o. female.  Patient presents to the emergency department with a chief complaint of back spasms which became severe in the afternoon.  She denies any injury or trauma prior to onset of symptoms.  She denies nausea, vomiting, urinary symptoms, abdominal pain.  She states that she frequently drinks 5 or 6 shots of alcohol to help with sleep due to chronic pain but that provided no relief earlier today.  She also states she has had a history of similar issues in the past has been treated with pregabalin and muscle relaxants.  She reports that the pregabalin has never helped but that the muscle relaxants have provided some relief with some side effects.  She does have history of hypermobility, chronic back pain, POTS, anxiety disorder, bilateral hip pain  HPI     Home Medications Prior to Admission medications   Medication Sig Start Date End Date Taking? Authorizing Provider  amphetamine-dextroamphetamine (ADDERALL) 20 MG tablet Take 2 tablets by mouth daily. 05/21/19  Yes [provider]  Cholecalciferol (VITAMIN D3) 1.25 MG (50000 UT) CAPS Take 1 capsule by mouth every 7 (seven) days.   Yes [provider]  DULoxetine (CYMBALTA) 30 MG capsule Take 30 mg by mouth daily.   Yes [provider]  EPINEPHrine (AUVI-Q) 0.3 mg/0.3 mL IJ SOAJ injection Inject 0.3 mg into the muscle as needed for anaphylaxis. 12/11/22  Yes Nehemiah Settle, FNP  levocetirizine (XYZAL) 5 MG tablet Take 5 mg by mouth in the morning and at bedtime.   Yes [provider]  methocarbamol (ROBAXIN) 500 MG tablet Take 1 tablet (500 mg total) by mouth 2 (two) times daily. 12/18/22  Yes Darrick Grinder, PA-C  omeprazole (PRILOSEC) 40 MG capsule Take 40 mg by mouth daily as needed  (for heartburn). 10/05/21  Yes [provider]  ondansetron (ZOFRAN-ODT) 8 MG disintegrating tablet Take 8 mg by mouth every 8 (eight) hours as needed for nausea or vomiting. 08/15/22  Yes [provider]  propranolol (INDERAL) 10 MG tablet Take 20 mg by mouth daily.   Yes [provider]  Carbinoxamine Maleate 4 MG TABS Take one to two tablets twice  as needed for runny nose Patient not taking: Reported on 12/17/2022 12/11/22   Nehemiah Settle, FNP  drospirenone-ethinyl estradiol (YAZ) 3-0.02 MG tablet Take 1 tablet by mouth daily. Patient not taking: Reported on 12/17/2022    [provider]      Allergies    Other, Peanut-containing drug products, Acyclovir and related, Peanut oil, and Amoxicillin-pot clavulanate    Review of Systems   Review of Systems  Physical Exam Updated Vital Signs BP 121/77 (BP Location: Right Arm)   Pulse (!) 104   Temp 98.6 F (37 C) (Oral)   Resp 16   LMP 12/10/2022 (Approximate)   SpO2 100%  Physical Exam Vitals and nursing note reviewed.  HENT:     Head: Normocephalic and atraumatic.  Eyes:     Extraocular Movements: Extraocular movements intact.     Conjunctiva/sclera: Conjunctivae normal.  Cardiovascular:     Rate and Rhythm: Normal rate.  Pulmonary:     Effort: Pulmonary effort is normal. No respiratory distress.  Musculoskeletal:        General: Tenderness present.  No signs of injury.     Cervical back: Normal range of motion.     Comments: Mild paraspinal muscle tenderness in the lumbar and thoracic region. No midline spinal tenderness noted in lumbar, thoracic, or cervical spine regions. Grip strength grossly equal bilaterally. Patient with normal gait.  Skin:    General: Skin is dry.  Neurological:     Mental Status: She is alert.  Psychiatric:        Speech: Speech normal.        Behavior: Behavior normal.     ED Results / Procedures / Treatments   Labs (all labs ordered are listed, but only  abnormal results are displayed) Labs Reviewed  CBC WITH DIFFERENTIAL/PLATELET - Abnormal; Notable for the following components:      Result Value   RBC 3.66 (*)    MCV 100.5 (*)    MCH 34.7 (*)    All other components within normal limits  COMPREHENSIVE METABOLIC PANEL - Abnormal; Notable for the following components:   Calcium 8.5 (*)    Total Protein 6.2 (*)    All other components within normal limits  ETHANOL    EKG None  Radiology No results found.  Procedures Procedures    Medications Ordered in ED Medications  methocarbamol (ROBAXIN) tablet 500 mg (500 mg Oral Given 12/18/22 0243)    ED Course/ Medical Decision Making/ A&P                             Medical Decision Making Amount and/or Complexity of Data Reviewed Labs: ordered.  Risk Prescription drug management.   This patient presents to the ED for concern of back pain, this involves an extensive number of treatment options, and is a complaint that carries with it a high risk of complications and morbidity.  The differential diagnosis includes muscle spasms, fracture, dislocation, degenerative disc disease, others   Co morbidities that complicate the patient evaluation  History of chronic back pain   Additional history obtained:  Additional history obtained from family at bedside   Lab Tests:  I Ordered, and personally interpreted labs.  The pertinent results include: Grossly unremarkable CBC, CMP.  Negative alcohol level   Imaging Studies ordered:  The patient denies red flag symptoms such as urinary incontinence, urinary retention, fecal incontinence, saddle anesthesia, recent trauma that would necessitate spinal imaging   Problem List / ED Course / Critical interventions / Medication management   I ordered medication including Robaxin for muscle spasms Reevaluation of the patient after these medicines showed that the patient improved I have reviewed the patients home medicines and have  made adjustments as needed  Test / Admission - Considered:  Patient's presentation is most consistent with muscle spasms at this time.  She describes cramping pain in her upper back.  The patient works in the air conditioned environment and endorses drinking plenty of water.  She denies any discolored urine.  Doubt dehydration at this time.  Patient reports chronic muscle spasms in her back and requests medication to help her get through the night until she can see her primary doctor tomorrow.  Patient provided with Robaxin which provided some relief.  Plan to discharge home with short course of Robaxin and plans for her to follow-up with primary team.  Presentation not consistent with cauda equina, fracture, dislocation.         Final Clinical Impression(s) / ED Diagnoses Final diagnoses:  Back muscle spasm  Rx / DC Orders ED Discharge Orders          Ordered    methocarbamol (ROBAXIN) 500 MG tablet  2 times daily        12/18/22 0308              Pamala Duffel 12/18/22 0308    Nira Conn, MD 12/18/22 [phone]

## 2022-12-18 NOTE — Discharge Instructions (Signed)
You were seen tonight for back pain which seemed most consistent with muscle spasms.  I have prescribed a short course of muscle relaxants to be taken as directed.  Please follow-up as planned with your primary care team for further evaluation and management.  If you develop any life-threatening symptoms please return to the emergency department.

## 2023-01-02 ENCOUNTER — Ambulatory Visit (HOSPITAL_BASED_OUTPATIENT_CLINIC_OR_DEPARTMENT_OTHER): Payer: BC Managed Care – PPO | Admitting: Cardiovascular Disease

## 2023-02-03 ENCOUNTER — Other Ambulatory Visit: Payer: Self-pay | Admitting: Internal Medicine

## 2023-02-03 DIAGNOSIS — I4711 Inappropriate sinus tachycardia, so stated: Secondary | ICD-10-CM

## 2023-02-04 ENCOUNTER — Telehealth: Payer: Self-pay | Admitting: Cardiovascular Disease

## 2023-02-04 ENCOUNTER — Other Ambulatory Visit: Payer: Self-pay

## 2023-02-04 MED ORDER — PROPRANOLOL HCL 10 MG PO TABS: 20.0000 mg | ORAL_TABLET | Freq: Every day | ORAL | 2 refills | Status: DC

## 2023-02-04 NOTE — Telephone Encounter (Signed)
*  STAT* If patient is at the pharmacy, call can be transferred to refill team.   1. Which medications need to be refilled? (please list name of each medication and dose if known) propranolol (INDERAL) 10 MG tablet   2. Which pharmacy/location (including street and city if local pharmacy) is medication to be sent to? [id number] - [place], [place] - [address] AT [facility] [address]   3. Do they need a 30 day or 90 day supply? 90

## 2023-02-05 MED ORDER — PROPRANOLOL HCL 10 MG PO TABS: 20.0000 mg | ORAL_TABLET | Freq: Every day | ORAL | 2 refills | Status: DC

## 2023-02-05 NOTE — Telephone Encounter (Signed)
Rx request sent to pharmacy.  

## 2023-04-08 ENCOUNTER — Encounter (HOSPITAL_BASED_OUTPATIENT_CLINIC_OR_DEPARTMENT_OTHER): Payer: Self-pay | Admitting: Family

## 2023-04-08 ENCOUNTER — Ambulatory Visit (HOSPITAL_BASED_OUTPATIENT_CLINIC_OR_DEPARTMENT_OTHER): Payer: BC Managed Care – PPO | Admitting: Family

## 2023-04-08 VITALS — BP 122/80 | HR 83 | Ht 69.0 in | Wt 153.8 lb

## 2023-04-08 DIAGNOSIS — R002 Palpitations: Secondary | ICD-10-CM

## 2023-04-08 DIAGNOSIS — I4711 Inappropriate sinus tachycardia, so stated: Secondary | ICD-10-CM

## 2023-04-08 DIAGNOSIS — I471 Supraventricular tachycardia, unspecified: Secondary | ICD-10-CM | POA: Diagnosis not present

## 2023-04-08 DIAGNOSIS — G90A Postural orthostatic tachycardia syndrome (POTS): Secondary | ICD-10-CM

## 2023-04-08 MED ORDER — PROPRANOLOL HCL 10 MG PO TABS: 10.0000 mg | ORAL_TABLET | Freq: Two times a day (BID) | ORAL | 3 refills | Status: DC

## 2023-04-08 NOTE — Progress Notes (Signed)
Cardiology Office Note:  .   Date:  04/08/2023  ID:  Cassandra Schultz, DOB [birth year], MRN [id number] PCP: Wilfrid Lund, PA  [facility] Providers Cardiologist:  Chilton Si, MD    History of Present Illness: [name] is a 22 y.o. female with history of SVT, inappropriate sinus tachycardia, POTS, palpitations, anxiety, ARFID (avoidant/restrictive food intake disorder).  Echo 2021 LVEF suggested to 5%.  Ambulatory monitor 04/2021 episodes of sinus tachycardia up to the 150s, short runs of SVT.  Office orthostatics previously borderline for POTS.  Seen by Dr. Wyline Mood 05/2022 with elevated heart rate, propranolol increased, referred to dysautonomia clinic at [facility].  Last seen 10/17/2022 by Dr. Duke Salvia.  Reported CAD with distant palpitations since early high school.  Heart rates prior to propranolol 140-150s at rest and low 200s when exercising. Symptoms consistent with POTS. Encouraged to increase to 2-2.5L fluid intake per day, compression stockings, increase to 2,500 mg sodium intake daily.  Presents today for follow-up independently.  Pleasant lady who enjoys reading.  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  Her symptoms related to POTS have been overall well-controlled-she is presently taking propranolol 10 mg twice daily.  She notes she is working to stay more hydrated.  She does liberalize her salt intake.  She has not yet purchased compression stockings and we discussed doing so for when she is working at the daycare as they will likely be position changes.  ROS: Please see the history of present illness.    All other systems reviewed and are negative.   Studies Reviewed: Marland Kitchen   EKG Interpretation Date/Time:  Monday April 08 2023 15:39:45 EDT Ventricular Rate:  83 PR Interval:  140 QRS Duration:  84 QT Interval:  388 QTC Calculation: 455 R Axis:   79  Text Interpretation: Normal sinus rhythm Normal ECG Confirmed by  Gillian Shields ([id number]) on 04/08/2023 3:41:37 PM    Cardiac Studies & Procedures       ECHOCARDIOGRAM  ECHOCARDIOGRAM COMPLETE 04/04/2020  Narrative ECHOCARDIOGRAM REPORT    Patient Name:   Cassandra Schultz Date of Exam: 04/04/2020 Medical Rec #:  [id number]        Height:       68.0 in Accession #:    [id number]       Weight:       144.0 lb Date of Birth:  [birth year]        BSA:          1.777 m Patient Age:    19 years         BP:           110/70 mmHg Patient Gender: F                HR:           75 bpm. Exam Location:  [address]  Procedure: 2D Echo, Cardiac Doppler, Color Doppler and Intracardiac Opacification Agent  Indications:    Q79.60 Ehlers-Danlos syndrome  History:        Patient has no prior history of Echocardiogram examinations. Myalgia. Polyarthralgia.  Sonographer:    Cathie Beams RCS Referring Phys: [phone] EVAN S COREY  IMPRESSIONS   1. Left ventricular ejection fraction, by estimation, is 55 to 60%. The left ventricle has normal function. The left ventricle has no regional wall motion abnormalities. Left ventricular diastolic parameters were normal. 2. Right ventricular systolic function is normal.  The right ventricular size is normal. Tricuspid regurgitation signal is inadequate for assessing PA pressure. 3. The mitral valve is normal in structure. No evidence of mitral valve regurgitation. No evidence of mitral stenosis. 4. The aortic valve is tricuspid. Aortic valve regurgitation is not visualized. No aortic stenosis is present. 5. The inferior vena cava is normal in size with greater than 50% respiratory variability, suggesting right atrial pressure of 3 mmHg.  FINDINGS Left Ventricle: Left ventricular ejection fraction, by estimation, is 55 to 60%. The left ventricle has normal function. The left ventricle has no regional wall motion abnormalities. The left ventricular internal cavity size was normal in size. There is no left ventricular  hypertrophy. Left ventricular diastolic parameters were normal.  Right Ventricle: The right ventricular size is normal. No increase in right ventricular wall thickness. Right ventricular systolic function is normal. Tricuspid regurgitation signal is inadequate for assessing PA pressure.  Left Atrium: Left atrial size was normal in size.  Right Atrium: Right atrial size was normal in size.  Pericardium: There is no evidence of pericardial effusion.  Mitral Valve: The mitral valve is normal in structure. No evidence of mitral valve regurgitation. No evidence of mitral valve stenosis.  Tricuspid Valve: The tricuspid valve is normal in structure. Tricuspid valve regurgitation is trivial.  Aortic Valve: The aortic valve is tricuspid. Aortic valve regurgitation is not visualized. No aortic stenosis is present.  Pulmonic Valve: The pulmonic valve was normal in structure. Pulmonic valve regurgitation is trivial.  Aorta: The aortic root and ascending aorta are structurally normal, with no evidence of dilitation.  Venous: The inferior vena cava is normal in size with greater than 50% respiratory variability, suggesting right atrial pressure of 3 mmHg.  IAS/Shunts: No atrial level shunt detected by color flow Doppler.   LEFT VENTRICLE PLAX 2D LVIDd:         4.40 cm  Diastology LVIDs:         2.90 cm  LV e' medial:    14.70 cm/s LV PW:         0.90 cm  LV E/e' medial:  5.7 LV IVS:        0.80 cm  LV e' lateral:   16.80 cm/s LVOT diam:     2.05 cm  LV E/e' lateral: 5.0 LV SV:         53 LV SV Index:   30 LVOT Area:     3.30 cm   RIGHT VENTRICLE RV Basal diam:  2.20 cm RV S prime:     14.00 cm/s TAPSE (M-mode): 2.2 cm  LEFT ATRIUM             Index       RIGHT ATRIUM          Index LA diam:        2.60 cm 1.46 cm/m  RA Area:     8.48 cm LA Vol (A2C):   33.3 ml 18.73 ml/m RA Volume:   13.40 ml 7.54 ml/m LA Vol (A4C):   26.3 ml 14.80 ml/m LA Biplane Vol: 31.6 ml 17.78  ml/m AORTIC VALVE LVOT Vmax:   76.40 cm/s LVOT Vmean:  52.400 cm/s LVOT VTI:    0.162 m  AORTA Ao Root diam: 2.90 cm  MITRAL VALVE MV Area (PHT): 3.99 cm    SHUNTS MV Decel Time: 190 msec    Systemic VTI:  0.16 m MV E velocity: 83.60 cm/s  Systemic Diam: 2.05 cm MV A velocity: 59.60 cm/s  MV E/A ratio:  1.40  Marca Ancona MD Electronically signed by Marca Ancona MD Signature Date/Time: 04/04/2020/4:53:01 PM    Final    MONITORS  LONG TERM MONITOR (3-14 DAYS) 05/09/2021  Narrative  SVT  Agree with findings. Average heart rate sinus tachycardia  100 bpm. Several runs of long RP SVT, triggered events. Brief 7 beats NSVT.   Patch Wear Time:  7 days and 14 hours (2022-11-02T20:22:11-398 to 2022-11-10T10:17:47-0500)  Patient had a min HR of 52 bpm, max HR of 186 bpm, and avg HR of 100 bpm. Predominant underlying rhythm was Sinus Rhythm. Slight P wave morphology changes were noted. 1 run of Ventricular Tachycardia occurred lasting 7 beats with a max rate of 113 bpm (avg 102 bpm). Isolated SVEs were rare (<1.0%), and no SVE Couplets or SVE Triplets were present. Isolated VEs were rare (<1.0%), and no VE Couplets or VE Triplets were present.           Risk Assessment/Calculations:             Physical Exam:   VS:  BP 122/80 (BP Location: Left Arm, Patient Position: Sitting, Cuff Size: Normal)   Pulse 83   Ht 5\' 9"  (1.753 m)   Wt 153 lb 12.8 oz (69.8 kg)   SpO2 97%   BMI 22.71 kg/m    Wt Readings from Last 3 Encounters:  04/08/23 153 lb 12.8 oz (69.8 kg)  12/11/22 157 lb 9.6 oz (71.5 kg)  10/17/22 163 lb (73.9 kg)    GEN: Well nourished, well developed in no acute distress NECK: No JVD; No carotid bruits CARDIAC: RRR, no murmurs, rubs, gallops RESPIRATORY:  Clear to auscultation without rales, wheezing or rhonchi  ABDOMEN: Soft, non-tender, non-distended EXTREMITIES:  No edema; No deformity   ASSESSMENT AND PLAN: .    POTS / IST - Symptoms well controlled  on Propranolol 10mg  BID, refills provided. She is liberalizing salt and working to increase fluid intake with electrolytes. 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 If symptoms not well controlled in future consider Propranolol 20mg  BID vs Ivabradine.        Dispo: follow up in 1 year  Signed, Alver Sorrow, NP

## 2023-04-08 NOTE — Patient Instructions (Addendum)
Medication Instructions:  Your physician has recommended you make the following change in your medication:  Propanolol 10mg  twice a day (Refills have been sent to pharmacy)  *If you need a refill on your cardiac medications before your next appointment, please call your pharmacy*   Follow-Up: At [facility], you and your health needs are our priority.  As part of our continuing mission to provide you with exceptional heart care, we have created designated Provider Care Teams.  These Care Teams include your primary Cardiologist (physician) and Advanced Practice Providers (APPs -  Physician Assistants and Nurse Practitioners) who all work together to provide you with the care you need, when you need it.  We recommend signing up for the patient portal called "MyChart".  Sign up information is provided on this After Visit Summary.  MyChart is used to connect with patients for Virtual Visits (Telemedicine).  Patients are able to view lab/test results, encounter notes, upcoming appointments, etc.  Non-urgent messages can be sent to your provider as well.   To learn more about what you can do with MyChart, go to [email or web address].    Your next appointment:   Follow up with Gillian Shields, NP in 1 year  Other Instructions: Your EKG today looked great!  Recommend drinking 2-2.5 L of fluid per day. Increase sodium intake to 2500mg  per day. Recommend compression stockings during the daytime.  Wear knee-high compression stockings during the daytime. Ones labeled 15-20 mmHg provide good compression.

## 2023-08-02 NOTE — Therapy (Signed)
OUTPATIENT PHYSICAL THERAPY THORACOLUMBAR  One time EVALUATION   Patient Name: Cassandra Schultz MRN: [id number] DOB:[birth year], 23 y.o., female Today's Date: 08/05/2023  END OF SESSION:  PT End of Session - 08/05/23 0804     Visit Number 1    Number of Visits 1    Date for PT Re-Evaluation 08/05/23    Authorization Type [employer]    PT Start Time 0800    PT Stop Time 0832    PT Time Calculation (min) 32 min    Activity Tolerance Patient tolerated treatment well    Behavior During Therapy [facility] for tasks assessed/performed             Past Medical History:  Diagnosis Date   ADHD    Allergy    Anxiety    Depression    reports ADHD    Eczema    POTS (postural orthostatic tachycardia syndrome) 10/17/2022   Raynaud's syndrome    Tremor of both hands    Past Surgical History:  Procedure Laterality Date   NO PAST SURGERIES     WISDOM TOOTH EXTRACTION     Patient Active Problem List   Diagnosis Date Noted   POTS (postural orthostatic tachycardia syndrome) 10/17/2022   Pollen-food allergy 08/25/2021   SVT (supraventricular tachycardia) (HCC) 05/09/2021   Palpitations 04/17/2021   Seasonal and perennial allergic rhinitis 09/21/2020   Seasonal allergic conjunctivitis 09/21/2020   Intrinsic atopic dermatitis 09/21/2020   Numbness and tingling of both legs - comes and goes after activity and onset of pain 08/03/2019   Fatigue 05/27/2019   Low back pain 02/24/2019   Bilateral knee pain 02/24/2019   Bilateral hip pain 02/24/2019   Acute bilateral ankle pain 02/24/2019   Healthcare maintenance 12/23/2018   Generalized anxiety disorder 04/03/2018   Attention deficit hyperactivity disorder (ADHD), combined type, severe 04/03/2018   Mild recurrent major depression (HCC) 04/03/2018   Developmental coordination disorder 04/03/2018   Allergy with anaphylaxis due to food 02/02/2016   Allergic rhinitis due to pollen 02/02/2016    PCP: Horton Marshall PA   REFERRING PROVIDER: Horton Marshall, PA  REFERRING DIAG: Hypermobility syndrome   Rationale for Evaluation and Treatment: Rehabilitation  THERAPY DIAG:  Hypermobility syndrome  ONSET DATE: chronic , ongoing   SUBJECTIVE:                                                                                                                                                                                           SUBJECTIVE STATEMENT: Patient is familiar to me from previous PT episodes.  She is here today for PT eval. She wants to get me  to look at her do her previous home program and get tips on how she can improve or maintain her form.  Overall she is doing well.  She just started back with doing her HEP from episodes past 2 days ago. She recently started a new job at a [employer].  She is very conscious about her lifting and posture.  She wants to be sure she is doing things correctly   PERTINENT HISTORY:  Chronic pain, fibromyalgia, POTS, SVT   PAIN:  Are you having pain? Yes: NPRS scale: min back pain, less pain overall  Pain location: low back, has some muscles in upper back, LLE sciatic  Pain description: aching soreness,  Aggravating factors: stress, mental   Relieving factors: rest  PRECAUTIONS: Other: SVT  RED FLAGS: None   WEIGHT BEARING RESTRICTIONS: No  FALLS:  Has patient fallen in last 6 months? No  LIVING ENVIRONMENT: Lives with: lives with roommate Lives in: House/apartment Stairs:  NA  Has following equipment at home: None  OCCUPATION: Working at day care, full time.    PLOF: Independent  PATIENT GOALS: I want to get stronger   NEXT MD VISIT: NT  OBJECTIVE:  Note: Objective measures were completed at Evaluation unless otherwise noted.  DIAGNOSTIC FINDINGS:  None recent   PATIENT SURVEYS:  NT due to not returning for PT   COGNITION: Overall cognitive status: Within functional limits for tasks assessed     SENSATION: WFL  MUSCLE LENGTH: Hamstrings: NT Thomas test:  NT   POSTURE: rounded shoulders, forward head, increased lumbar lordosis, and can correct   PALPATION: NT   LUMBAR ROM:   AROM eval  Flexion WFL  Extension WFL , min discomfort   Right lateral flexion WFL, sore  Left lateral flexion WFL, sore  Right rotation WFL  Left rotation WFL    (Blank rows = not tested)  LOWER EXTREMITY ROM:   WNL   Active  Right eval Left eval  Hip flexion    Hip extension    Hip abduction    Hip adduction    Hip internal rotation    Hip external rotation    Knee flexion    Knee extension    Ankle dorsiflexion    Ankle plantarflexion    Ankle inversion    Ankle eversion     (Blank rows = not tested)  LOWER EXTREMITY MMT:  WNL   MMT Right eval Left eval  Hip flexion    Hip extension    Hip abduction     Hip adduction    Hip internal rotation    Hip external rotation    Knee flexion    Knee extension    Ankle dorsiflexion    Ankle plantarflexion    Ankle inversion    Ankle eversion     (Blank rows = not tested)  LUMBAR SPECIAL TESTS:  NA  FUNCTIONAL TESTS:  NA   GAIT: Distance walked: 150+ Assistive device utilized: None Level of assistance: Complete Independence Comments: No deviations   TREATMENT DATE:   [facility] Adult PT Treatment:                                                DATE: 08/05/23 Therapeutic Activity: Patient performed the following core exercises: Anterior pelvic tilt/Post tilt in order to find neutral spine Transverse abdominis activation x 5 Supine  90/90 isometric hold with and without support under her pelvis. Supine 9090 with knee and hip extension alternating to fatigue Low plank High plank Side plank Squat Hip hinge/dead lift Bridging Modalities: None  Self Care: Feedback given throughout the session                                                                                                                                  PATIENT EDUCATION:  Education details: Feedback regarding  home program, patient did not want any new exercises issued Person educated: Patient Education method: Explanation, Demonstration, Tactile cues, and Verbal cues Education comprehension: verbalized understanding and returned demonstration  HOME EXERCISE PROGRAM: Patient has from previous episodes PT  ASSESSMENT:  CLINICAL IMPRESSION: Patient is a 23 y.o. female who was seen today for physical therapy evaluation and treatment for hypermobility.  Today with a one-time evaluation  OBJECTIVE IMPAIRMENTS: decreased strength, pain, and joint hypermobility generalized .   ACTIVITY LIMITATIONS: caring for others  PARTICIPATION LIMITATIONS: occupation  PERSONAL FACTORS: 1-2 comorbidities: Chronic pain, fibromyalgia, POTS  are also affecting patient's functional outcome.   REHAB POTENTIAL: Excellent  CLINICAL DECISION MAKING: Stable/uncomplicated  EVALUATION COMPLEXITY: Low   GOALS: Goals reviewed with patient? Yes  SHORT TERM GOALS= LONG TERM GOALS: Target date: 08/05/23  Patient will be given feedback regarding safe for exercise program, body mechanics posture and lifting Baseline:  Goal status: INITIAL, MET   PLAN:  PT FREQUENCY: one time visit  PT DURATION:  1 sessions  PLANNED INTERVENTIONS: 97530- Therapeutic activity and 29562- Self Care.  PLAN FOR NEXT SESSION: NA , 1 x visit    Wilberto Console, PT 08/05/2023, 1:23 PM   [name], PT 08/05/23 1:23 PM Phone: [phone] Fax: [phone]

## 2023-08-05 ENCOUNTER — Encounter: Payer: Self-pay | Admitting: Physical Therapy

## 2023-08-05 ENCOUNTER — Ambulatory Visit: Payer: BC Managed Care – PPO | Attending: Family Medicine | Admitting: Physical Therapy

## 2023-08-05 DIAGNOSIS — M357 Hypermobility syndrome: Secondary | ICD-10-CM | POA: Diagnosis present

## 2023-09-17 ENCOUNTER — Ambulatory Visit: Admitting: Family

## 2023-09-23 NOTE — Patient Instructions (Incomplete)
 Allergic rhinitis-  Continue allergen avoidance measures directed toward pollen, pets, dust mite, and mold as listed below Previously on allergy injections, but stopped due to being charged a co-pay everytime she gets an allergy injection. She would like to restart allergy injections. She is currently on propranolol and thinks that she was on this medication while on allergy injections last time. Will send a message to Dr. Delorse Lek (primary allergist) to see if ok to restart. We will be in contact with you Continue Xyzal 5 mg once a day as needed for runny nose. Continue to rotate between antihistamines Continue saline nasal rinses as needed for nasal symptoms. Use this before any medicated nasal sprays for best result May use nasal saline gel as needed for dry nostrils Letter written for her to wear a mask when outside due to her environmental allergies  Allergic conjunctivitis Continue Opcon-A eyedrops 1 to 2 drops in each eye up to 4 times a day as needed.  Some other over the counter eye drops include Pataday one drop in each eye once a day as needed for red, itchy eyes OR Zaditor one drop in each eye twice a day as needed for red itchy eyes.  Atopic dermatitis Continue with twice a day moisturizing routine Continue Eucrisa to red itchy areas up to twice a day as needed For stubborn red itchy areas below your face, continue triamcinolone 0.1% ointment up to twice a day as needed.  Do not use this medication for longer than 2 weeks in a row  Food allergy Continue to avoid soy, peanut, and tree nuts.  In case of an allergic reaction, take Benadryl 50 mg every 4 hours, and if life-threatening symptoms occur, inject with Auvi-Q 0.3 mg.  Oral allergy syndrome The oral allergy syndrome (OAS) or pollen-food allergy syndrome (PFAS) is a relatively common form of food allergy, particularly in adults. It typically occurs in people who have pollen allergies when the immune system "sees" proteins on the  food that look like proteins on the pollen. This results in the allergy antibody (IgE) binding to the food instead of the pollen. Patients typically report itching and/or mild swelling of the mouth and throat immediately following ingestion of certain uncooked fruits (including nuts) or raw vegetables. Only a very small number of affected individuals experience systemic allergic reactions, such as anaphylaxis which occurs with true food allergies.    Discussed that her symptoms to fruits and vegetables is oral allergy syndrome and not MCAS. Offered to do tryptase but she declines  Call the clinic if this treatment plan is not working well for you.  Follow up in 3 months or sooner if needed.  Reducing Pollen Exposure The American Academy of Allergy, Asthma and Immunology suggests the following steps to reduce your exposure to pollen during allergy seasons. Do not hang sheets or clothing out to dry; pollen may collect on these items. Do not mow lawns or spend time around freshly cut grass; mowing stirs up pollen. Keep windows closed at night.  Keep car windows closed while driving. Minimize morning activities outdoors, a time when pollen counts are usually at their highest. Stay indoors as much as possible when pollen counts or humidity is high and on windy days when pollen tends to remain in the air longer. Use air conditioning when possible.  Many air conditioners have filters that trap the pollen spores. Use a HEPA room air filter to remove pollen form the indoor air you breathe.  Control of Dog or  Cat Allergen Avoidance is the best way to manage a dog or cat allergy. If you have a dog or cat and are allergic to dog or cats, consider removing the dog or cat from the home. If you have a dog or cat but don't want to find it a new home, or if your family wants a pet even though someone in the household is allergic, here are some strategies that may help keep symptoms at bay:  Keep the pet out of  your bedroom and restrict it to only a few rooms. Be advised that keeping the dog or cat in only one room will not limit the allergens to that room. Don't pet, hug or kiss the dog or cat; if you do, wash your hands with soap and water. High-efficiency particulate air (HEPA) cleaners run continuously in a bedroom or living room can reduce allergen levels over time. Regular use of a high-efficiency vacuum cleaner or a central vacuum can reduce allergen levels. Giving your dog or cat a bath at least once a week can reduce airborne allergen.  Control of Mold Allergen Mold and fungi can grow on a variety of surfaces provided certain temperature and moisture conditions exist.  Outdoor molds grow on plants, decaying vegetation and soil.  The major outdoor mold, Alternaria and Cladosporium, are found in very high numbers during hot and dry conditions.  Generally, a late Summer - Fall peak is seen for common outdoor fungal spores.  Rain will temporarily lower outdoor mold spore count, but counts rise rapidly when the rainy period ends.  The most important indoor molds are Aspergillus and Penicillium.  Dark, humid and poorly ventilated basements are ideal sites for mold growth.  The next most common sites of mold growth are the bathroom and the kitchen.  Outdoor [employer] Use air conditioning and keep windows closed Avoid exposure to decaying vegetation. Avoid leaf raking. Avoid grain handling. Consider wearing a face mask if working in moldy areas.  Indoor Mold Control Maintain humidity below 50%. Clean washable surfaces with 5% bleach solution. Remove sources e.g. Contaminated carpets.   Control of Dust Mite Allergen Dust mites play a major role in allergic asthma and rhinitis. They occur in environments with high humidity wherever human skin is found. Dust mites absorb humidity from the atmosphere (ie, they do not drink) and feed on organic matter (including shed human and animal skin). Dust mites  are a microscopic type of insect that you cannot see with the naked eye. High levels of dust mites have been detected from mattresses, pillows, carpets, upholstered furniture, bed covers, clothes, soft toys and any woven material. The principal allergen of the dust mite is found in its feces. A gram of dust may contain 1,000 mites and 250,000 fecal particles. Mite antigen is easily measured in the air during house cleaning activities. Dust mites do not bite and do not cause harm to humans, other than by triggering allergies/asthma.  Ways to decrease your exposure to dust mites in your home:  1. Encase mattresses, box springs and pillows with a mite-impermeable barrier or cover  2. Wash sheets, blankets and drapes weekly in hot water (130 F) with detergent and dry them in a dryer on the hot setting.  3. Have the room cleaned frequently with a vacuum cleaner and a damp dust-mop. For carpeting or rugs, vacuuming with a vacuum cleaner equipped with a high-efficiency particulate air (HEPA) filter. The dust mite allergic individual should not be in a room which  is being cleaned and should wait 1 hour after cleaning before going into the room.  4. Do not sleep on upholstered furniture (eg, couches).  5. If possible removing carpeting, upholstered furniture and drapery from the home is ideal. Horizontal blinds should be eliminated in the rooms where the person spends the most time (bedroom, study, television room). Washable vinyl, roller-type shades are optimal.  6. Remove all non-washable stuffed toys from the bedroom. Wash stuffed toys weekly like sheets and blankets above.  7. Reduce indoor humidity to less than 50%. Inexpensive humidity monitors can be purchased at most hardware stores. Do not use a humidifier as can make the problem worse and are not recommended.  Oral allergy syndrome

## 2023-09-24 ENCOUNTER — Encounter: Payer: Self-pay | Admitting: Family

## 2023-09-24 ENCOUNTER — Ambulatory Visit (INDEPENDENT_AMBULATORY_CARE_PROVIDER_SITE_OTHER): Admitting: Family

## 2023-09-24 ENCOUNTER — Other Ambulatory Visit: Payer: Self-pay

## 2023-09-24 VITALS — BP 90/60 | HR 117 | Temp 98.5°F | Resp 16 | Ht 69.0 in | Wt 145.0 lb

## 2023-09-24 DIAGNOSIS — T781XXD Other adverse food reactions, not elsewhere classified, subsequent encounter: Secondary | ICD-10-CM | POA: Diagnosis not present

## 2023-09-24 DIAGNOSIS — J3089 Other allergic rhinitis: Secondary | ICD-10-CM

## 2023-09-24 DIAGNOSIS — J302 Other seasonal allergic rhinitis: Secondary | ICD-10-CM

## 2023-09-24 DIAGNOSIS — H1010 Acute atopic conjunctivitis, unspecified eye: Secondary | ICD-10-CM

## 2023-09-24 DIAGNOSIS — L2084 Intrinsic (allergic) eczema: Secondary | ICD-10-CM

## 2023-09-24 DIAGNOSIS — H1013 Acute atopic conjunctivitis, bilateral: Secondary | ICD-10-CM

## 2023-09-24 DIAGNOSIS — T7800XA Anaphylactic reaction due to unspecified food, initial encounter: Secondary | ICD-10-CM

## 2023-09-24 MED ORDER — EPINEPHRINE 0.3 MG/0.3ML IJ SOAJ: 0.3000 mg | INTRAMUSCULAR | 1 refills | Status: AC | PRN

## 2023-09-24 NOTE — Progress Notes (Signed)
 [address] [place] [address] Dept: [phone]  FOLLOW UP NOTE  Patient ID: Cassandra Schultz, female    DOB: 10/26/2000  Age: 23 y.o. MRN: [id number] Date of Office Visit: 09/24/2023  Assessment  Chief Complaint: Immunotherapy (Wants to discuss restarting ) and Follow-up (Needs a doctors note to be able to wear a respirator mask while at work.)  HPI Cassandra Schultz is an 23 year old female who presents today for follow-up of seasonal and perennial allergic rhinitis, seasonal allergic conjunctivitis, intrinsic atopic dermatitis, allergy with anaphylaxis due to food, and pollen food allergy.  She was last seen on December 11, 2022 by myself.  She reports that she was recently diagnosed with pneumonia on September 16, 2023.  She has finished the azithromycin and the prednisone that she was prescribed.  Also, she had influenza in December.  Allergic rhinitis: She reports that she would like to restart allergy injections.  She had previously stopped allergy injections due to not being able to afford paying the co-pay every time she got an allergy injection.  She feels like she can afford this now.  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  She is requesting a letter stating that she can wear respirator mask while the kids are outside.  She mentions that as soon as she comes home from being outside she strips her clothes off in a laundry basket outside of her bedroom and then will shower.  She reports clear rhinorrhea, nasal congestion, and postnasal drip.  She does not think that she has been treated for any sinus infections since we last saw her.  She is currently taking Xyzal and reports that she switches antihistamines every 5 months.  She feels like Xyzal right now is working better than carbinoxamine.  Claritin, Allegra, and Zyrtec do not work.  She also does not feel like nasal sprays helped.  All the nasal sprays do is good on the back of her throat.  Allergic conjunctivitis: She  reports itchy watery eyes for which she will use Opcon A.  She will use a wet paper towel and wipe her eyes and eyelashes after being outside. She is currently on propranolol for tachycardia. She thinks that she was taking this medication while on allergy injections.   Atopic dermatitis is reported as doing good.  Her skin usually does good till the summer heat.  And she will get a patch on her right leg for which she will use triamcinolone and Eucrisa and that helps.  Food allergy: She continues to avoid soy, peanuts, and tree nuts without any accidental ingestion or use of her epinephrine autoinjector device.  She does not wish to do skin testing or get lab work to follow-up on her food allergies.  Oral allergy syndrome: She wonders if she has MCAS due to having so many environmental and food allergies.  She mentions that certain fruits or vegetables will cause not true food allergy reactions.  Discussed oral allergy syndrome.   Drug Allergies:  Allergies  Allergen Reactions   Other Anaphylaxis and Other (See Comments)    All tree nuts, seasonal allergies dust, mold, pollen, horses, cockroaches, cats   Peanut-Containing Drug Products Anaphylaxis   Acyclovir And Related    Peanut Oil Other (See Comments)   Amoxicillin-Pot Clavulanate Hives and Other (See Comments)    Patient says she has no allergies to antibiotics "As far as I know"    Review of Systems: Negative except as per HPI  Physical Exam: BP 90/60   Pulse (!) 117   Temp 98.5 F (36.9 C)   Resp 16   Ht 5\' 9"  (1.753 m)   Wt 145 lb (65.8 kg)   SpO2 98%   BMI 21.41 kg/m    Physical Exam Constitutional:      Appearance: Normal appearance.  HENT:     Head: Normocephalic and atraumatic.     Comments: Pharynx normal. Eyes normal. Ears normal. Nose: bilateral lower turbinates moderately edematous with clear drainage noted.    Right Ear: Tympanic membrane, ear canal and external ear normal.     Left Ear: Tympanic  membrane, ear canal and external ear normal.     Mouth/Throat:     Mouth: Mucous membranes are moist.     Pharynx: Oropharynx is clear.  Eyes:     Conjunctiva/sclera: Conjunctivae normal.  Cardiovascular:     Rate and Rhythm: Regular rhythm.     Heart sounds: Normal heart sounds.  Pulmonary:     Effort: Pulmonary effort is normal.     Breath sounds: Normal breath sounds.     Comments: Lungs clear to auscultation Musculoskeletal:     Cervical back: Neck supple.  Skin:    General: Skin is warm.  Neurological:     Mental Status: She is alert and oriented to person, place, and time.  Psychiatric:        Mood and Affect: Mood normal.        Behavior: Behavior normal.        Thought Content: Thought content normal.        Judgment: Judgment normal.     Diagnostics:  None  Assessment and Plan: 1. Seasonal and perennial allergic rhinitis   2. Seasonal allergic conjunctivitis   3. Allergy with anaphylaxis due to food   4. Pollen-food allergy, subsequent encounter   5. Intrinsic atopic dermatitis     No orders of the defined types were placed in this encounter.   Patient Instructions  Allergic rhinitis-  Continue allergen avoidance measures directed toward pollen, pets, dust mite, and mold as listed below Previously on allergy injections, but stopped due to being charged a co-pay everytime she gets an allergy injection. She would like to restart allergy injections. She is currently on propranolol and thinks that she was on this medication while on allergy injections last time. Will send a message to Dr. Delorse Lek (primary allergist) to see if ok to restart. We will be in contact with you Continue Xyzal 5 mg once a day as needed for runny nose. Continue to rotate between antihistamines Continue saline nasal rinses as needed for nasal symptoms. Use this before any medicated nasal sprays for best result May use nasal saline gel as needed for dry nostrils Letter written for her to  wear a mask when outside due to her environmental allergies  Allergic conjunctivitis Continue Opcon-A eyedrops 1 to 2 drops in each eye up to 4 times a day as needed.  Some other over the counter eye drops include Pataday one drop in each eye once a day as needed for red, itchy eyes OR Zaditor one drop in each eye twice a day as needed for red itchy eyes.  Atopic dermatitis Continue with twice a day moisturizing routine Continue Eucrisa to red itchy areas up to twice a day as needed For stubborn red itchy areas below your face, continue triamcinolone 0.1% ointment up to twice a day as needed.  Do not use this medication for longer  than 2 weeks in a row  Food allergy Continue to avoid soy, peanut, and tree nuts.  In case of an allergic reaction, take Benadryl 50 mg every 4 hours, and if life-threatening symptoms occur, inject with Auvi-Q 0.3 mg.  Oral allergy syndrome The oral allergy syndrome (OAS) or pollen-food allergy syndrome (PFAS) is a relatively common form of food allergy, particularly in adults. It typically occurs in people who have pollen allergies when the immune system "sees" proteins on the food that look like proteins on the pollen. This results in the allergy antibody (IgE) binding to the food instead of the pollen. Patients typically report itching and/or mild swelling of the mouth and throat immediately following ingestion of certain uncooked fruits (including nuts) or raw vegetables. Only a very small number of affected individuals experience systemic allergic reactions, such as anaphylaxis which occurs with true food allergies.    Discussed that her symptoms to fruits and vegetables is oral allergy syndrome and not MCAS. Offered to do tryptase but she declines  Call the clinic if this treatment plan is not working well for you.  Follow up in 3 months or sooner if needed.  Reducing Pollen Exposure The American Academy of Allergy, Asthma and Immunology suggests the following  steps to reduce your exposure to pollen during allergy seasons. Do not hang sheets or clothing out to dry; pollen may collect on these items. Do not mow lawns or spend time around freshly cut grass; mowing stirs up pollen. Keep windows closed at night.  Keep car windows closed while driving. Minimize morning activities outdoors, a time when pollen counts are usually at their highest. Stay indoors as much as possible when pollen counts or humidity is high and on windy days when pollen tends to remain in the air longer. Use air conditioning when possible.  Many air conditioners have filters that trap the pollen spores. Use a HEPA room air filter to remove pollen form the indoor air you breathe.  Control of Dog or Cat Allergen Avoidance is the best way to manage a dog or cat allergy. If you have a dog or cat and are allergic to dog or cats, consider removing the dog or cat from the home. If you have a dog or cat but don't want to find it a new home, or if your family wants a pet even though someone in the household is allergic, here are some strategies that may help keep symptoms at bay:  Keep the pet out of your bedroom and restrict it to only a few rooms. Be advised that keeping the dog or cat in only one room will not limit the allergens to that room. Don't pet, hug or kiss the dog or cat; if you do, wash your hands with soap and water. High-efficiency particulate air (HEPA) cleaners run continuously in a bedroom or living room can reduce allergen levels over time. Regular use of a high-efficiency vacuum cleaner or a central vacuum can reduce allergen levels. Giving your dog or cat a bath at least once a week can reduce airborne allergen.  Control of Mold Allergen Mold and fungi can grow on a variety of surfaces provided certain temperature and moisture conditions exist.  Outdoor molds grow on plants, decaying vegetation and soil.  The major outdoor mold, Alternaria and Cladosporium, are found in  very high numbers during hot and dry conditions.  Generally, a late Summer - Fall peak is seen for common outdoor fungal spores.  Rain will temporarily lower  outdoor mold spore count, but counts rise rapidly when the rainy period ends.  The most important indoor molds are Aspergillus and Penicillium.  Dark, humid and poorly ventilated basements are ideal sites for mold growth.  The next most common sites of mold growth are the bathroom and the kitchen.  Outdoor [employer] Use air conditioning and keep windows closed Avoid exposure to decaying vegetation. Avoid leaf raking. Avoid grain handling. Consider wearing a face mask if working in moldy areas.  Indoor Mold Control Maintain humidity below 50%. Clean washable surfaces with 5% bleach solution. Remove sources e.g. Contaminated carpets.   Control of Dust Mite Allergen Dust mites play a major role in allergic asthma and rhinitis. They occur in environments with high humidity wherever human skin is found. Dust mites absorb humidity from the atmosphere (ie, they do not drink) and feed on organic matter (including shed human and animal skin). Dust mites are a microscopic type of insect that you cannot see with the naked eye. High levels of dust mites have been detected from mattresses, pillows, carpets, upholstered furniture, bed covers, clothes, soft toys and any woven material. The principal allergen of the dust mite is found in its feces. A gram of dust may contain 1,000 mites and 250,000 fecal particles. Mite antigen is easily measured in the air during house cleaning activities. Dust mites do not bite and do not cause harm to humans, other than by triggering allergies/asthma.  Ways to decrease your exposure to dust mites in your home:  1. Encase mattresses, box springs and pillows with a mite-impermeable barrier or cover  2. Wash sheets, blankets and drapes weekly in hot water (130 F) with detergent and dry them in a dryer on the hot  setting.  3. Have the room cleaned frequently with a vacuum cleaner and a damp dust-mop. For carpeting or rugs, vacuuming with a vacuum cleaner equipped with a high-efficiency particulate air (HEPA) filter. The dust mite allergic individual should not be in a room which is being cleaned and should wait 1 hour after cleaning before going into the room.  4. Do not sleep on upholstered furniture (eg, couches).  5. If possible removing carpeting, upholstered furniture and drapery from the home is ideal. Horizontal blinds should be eliminated in the rooms where the person spends the most time (bedroom, study, television room). Washable vinyl, roller-type shades are optimal.  6. Remove all non-washable stuffed toys from the bedroom. Wash stuffed toys weekly like sheets and blankets above.  7. Reduce indoor humidity to less than 50%. Inexpensive humidity monitors can be purchased at most hardware stores. Do not use a humidifier as can make the problem worse and are not recommended.  Oral allergy syndrome   Return in about 3 months (around 12/24/2023), or if symptoms worsen or fail to improve.    Thank you for the opportunity to care for this patient.  Please do not hesitate to contact me with questions.  Nehemiah Settle, FNP [facility]

## 2023-09-27 ENCOUNTER — Telehealth: Payer: Self-pay | Admitting: Family

## 2023-09-27 NOTE — Telephone Encounter (Signed)
 Please let Heavenly know that Dr. Delorse Lek is ok with her re-starting allergy injections while on propranolol 10 mg bid. After she has called her insurance to find out the cost please let us know if she would like to re-start allergy injections.  Nehemiah Settle, FNP [facility]

## 2023-10-02 NOTE — Telephone Encounter (Signed)
 MyChart Message sent

## 2023-12-24 ENCOUNTER — Encounter: Payer: Self-pay | Admitting: Allergy & Immunology

## 2023-12-24 ENCOUNTER — Ambulatory Visit (INDEPENDENT_AMBULATORY_CARE_PROVIDER_SITE_OTHER): Admitting: Allergy & Immunology

## 2023-12-24 ENCOUNTER — Other Ambulatory Visit: Payer: Self-pay

## 2023-12-24 DIAGNOSIS — L2084 Intrinsic (allergic) eczema: Secondary | ICD-10-CM | POA: Diagnosis not present

## 2023-12-24 DIAGNOSIS — T781XXD Other adverse food reactions, not elsewhere classified, subsequent encounter: Secondary | ICD-10-CM

## 2023-12-24 DIAGNOSIS — J302 Other seasonal allergic rhinitis: Secondary | ICD-10-CM | POA: Diagnosis not present

## 2023-12-24 DIAGNOSIS — J3089 Other allergic rhinitis: Secondary | ICD-10-CM | POA: Diagnosis not present

## 2023-12-24 DIAGNOSIS — T7800XA Anaphylactic reaction due to unspecified food, initial encounter: Secondary | ICD-10-CM

## 2023-12-24 MED ORDER — LEVOCETIRIZINE DIHYDROCHLORIDE 5 MG PO TABS: 5.0000 mg | ORAL_TABLET | Freq: Two times a day (BID) | ORAL | 4 refills | Status: AC

## 2023-12-24 NOTE — Progress Notes (Unsigned)
 FOLLOW UP  Date of Service/Encounter:  12/24/23   Assessment:   Seasonal and perennial allergic rhinitis - was on allergy  shots  Food allergies  Atopic dermatitis  Plan/Recommendations:   There are no Patient Instructions on file for this visit.   Subjective:   Cassandra Schultz is a 23 y.o. female presenting today for follow up of No chief complaint on file.   Cassandra Schultz has a history of the following: Patient Active Problem List   Diagnosis Date Noted   POTS (postural orthostatic tachycardia syndrome) 10/17/2022   Pollen-food allergy  08/25/2021   SVT (supraventricular tachycardia) (HCC) 05/09/2021   Palpitations 04/17/2021   Seasonal and perennial allergic rhinitis 09/21/2020   Seasonal allergic conjunctivitis 09/21/2020   Intrinsic atopic dermatitis 09/21/2020   Numbness and tingling of both legs - comes and goes after activity and onset of pain 08/03/2019   Fatigue 05/27/2019   Low back pain 02/24/2019   Bilateral knee pain 02/24/2019   Bilateral hip pain 02/24/2019   Acute bilateral ankle pain 02/24/2019   Healthcare maintenance 12/23/2018   Generalized anxiety disorder 04/03/2018   Attention deficit hyperactivity disorder (ADHD), combined type, severe 04/03/2018   Mild recurrent major depression (HCC) 04/03/2018   Developmental coordination disorder 04/03/2018   Allergy  with anaphylaxis due to food 02/02/2016   Allergic rhinitis due to pollen 02/02/2016    History obtained from: chart review and {Persons; PED relatives w/patient:19415::patient}.  Discussed the use of AI scribe software for clinical note transcription with the patient and/or guardian, who gave verbal consent to proceed.  Cassandra Schultz is a 23 y.o. female presenting for {Blank single:19197::a food challenge,a drug challenge,skin testing,a sick visit,an evaluation of ***,a follow up visit}.  Asthma/Respiratory Symptom History: ***  Allergic Rhinitis Symptom History:  ***  Cassandra Schultz is on allergen immunotherapy. She receives two injections. Immunotherapy script #1 contains molds, dust mites, and cat. She currently receives 0.30mL of the RED vial (1/100). Immunotherapy script #2 contains {Blank multiple:19196:a:trees,weeds,grasses,molds,dust mites,cat,dog,cockroach}. She currently receives {Blank single:19197::0.58mL,0.10mL,0.15mL,0.20mL,0.25mL,0.30mL,0.35mL,0.40mL,0.45mL,0.50mL} of the {Blank single:19197::BLUE vial (1/100,000),GOLD vial (1/10,000),GREEN vial (1/1,000),RED vial (1/100)}. She started shots {Blank single:19197::January,February,March,April,May,June,July,August,September,October,November,December} of *** and {Blank single:19197::reached maintenance,not yet reached maintenance.} in {Blank single:19197::January,February,March,April,May,June,July,August,September,October,November,December} of ***.   Food Allergy  Symptom History: ***  Skin Symptom History: ***  GERD Symptom History: ***  Infection Symptom History: ***  Otherwise, there have been no changes to her past medical history, surgical history, family history, or social history.    Review of systems otherwise negative other than that mentioned in the HPI.    Objective:   There were no vitals taken for this visit. There is no height or weight on file to calculate BMI.    Physical Exam   Diagnostic studies: {Blank single:19197::none,deferred due to recent antihistamine use,deferred due to insurance stipulations that require a separate visit for testing,labs sent instead, }  Spirometry: {Blank single:19197::results normal (FEV1: ***%, FVC: ***%, FEV1/FVC: ***%),results abnormal (FEV1: ***%, FVC: ***%, FEV1/FVC: ***%)}.    {Blank single:19197::Spirometry consistent with mild obstructive disease,Spirometry consistent with moderate obstructive disease,Spirometry  consistent with severe obstructive disease,Spirometry consistent with possible restrictive disease,Spirometry consistent with mixed obstructive and restrictive disease,Spirometry uninterpretable due to technique,Spirometry consistent with normal pattern}. {Blank single:19197::Albuterol/Atrovent nebulizer,Xopenex/Atrovent nebulizer,Albuterol nebulizer,Albuterol four puffs via MDI,Xopenex four puffs via MDI} treatment given in clinic with {Blank single:19197::significant improvement in FEV1 per ATS criteria,significant improvement in FVC per ATS criteria,significant improvement in FEV1 and FVC per ATS criteria,improvement in FEV1, but not significant per ATS criteria,improvement in FVC, but not significant per ATS criteria,improvement  in FEV1 and FVC, but not significant per ATS criteria,no improvement}.  Allergy  Studies: {Blank single:19197::none,deferred due to recent antihistamine use,deferred due to insurance stipulations that require a separate visit for testing,labs sent instead, }    {Blank single:19197::Allergy  testing results were read and interpreted by myself, documented by clinical staff., }      Marty Shaggy, MD  [facility] 

## 2023-12-24 NOTE — Patient Instructions (Addendum)
 Allergic rhinitis (pollen, pets, dust mite, and mold) - with oral allergy  syndrome - Continue with alternating antihistamines as you are doing.  - You can increase to twice daily on bad dreams.  - Continue with your Opcon-A eye drops as needed.   Atopic dermatitis - Continue with twice a day moisturizing routine - Continue Eucrisa  to red itchy areas up to twice a day as needed - Continue with triamcinolone  0.1% ointment up to twice a day as needed.  Food allergy  (soy protein isolate, peanut, tree nuts)  - Continue to avoid all of your triggering foods. - EpiPen  script is up to date.   Return if symptoms worsen or fail to improve. You can have the follow up appointment with Dr. Iva or a Nurse Practicioner (our Nurse Practitioners are excellent and always have Physician oversight!).    Please inform us  of any Emergency Department visits, hospitalizations, or changes in symptoms. Call us  before going to the ED for breathing or allergy  symptoms since we might be able to fit you in for a sick visit. Feel free to contact us  anytime with any questions, problems, or concerns.  It was a pleasure to meet you today!  Websites that have reliable patient information: 1. American Academy of Asthma, Allergy , and Immunology: www.aaaai.org 2. Food Allergy  Research and Education (FARE): foodallergy.org 3. Mothers of Asthmatics: http://www.asthmacommunitynetwork.org 4. American College of Allergy , Asthma, and Immunology: www.acaai.org      "Like" us  on Facebook and Instagram for our latest updates!      A healthy democracy works best when [employer] participate! Make sure you are registered to vote! If you have moved or changed any of your contact information, you will need to get this updated before voting! Scan the QR codes below to learn more!

## 2024-07-07 ENCOUNTER — Ambulatory Visit
Admission: EM | Admit: 2024-07-07 | Discharge: 2024-07-07 | Disposition: A | Discharge status: Home / Self Care | Payer: Self-pay | Attending: Physician Assistant | Admitting: Physician Assistant

## 2024-07-07 ENCOUNTER — Other Ambulatory Visit: Payer: Self-pay

## 2024-07-07 DIAGNOSIS — T3695XA Adverse effect of unspecified systemic antibiotic, initial encounter: Secondary | ICD-10-CM | POA: Insufficient documentation

## 2024-07-07 DIAGNOSIS — B379 Candidiasis, unspecified: Secondary | ICD-10-CM | POA: Insufficient documentation

## 2024-07-07 DIAGNOSIS — N390 Urinary tract infection, site not specified: Secondary | ICD-10-CM | POA: Insufficient documentation

## 2024-07-07 LAB — POCT URINE DIPSTICK
Bilirubin, UA: NEGATIVE
Blood, UA: NEGATIVE
Glucose, UA: NEGATIVE mg/dL
Ketones, POC UA: NEGATIVE mg/dL
Nitrite, UA: POSITIVE — AB
POC PROTEIN,UA: NEGATIVE
Spec Grav, UA: 1.015
Urobilinogen, UA: 0.2 U/dL
pH, UA: 7

## 2024-07-07 MED ORDER — FLUCONAZOLE 150 MG PO TABS: 150.0000 mg | ORAL_TABLET | ORAL | 0 refills | Status: AC | PRN

## 2024-07-07 MED ORDER — NITROFURANTOIN MONOHYD MACRO 100 MG PO CAPS: 100.0000 mg | ORAL_CAPSULE | Freq: Two times a day (BID) | ORAL | 0 refills | Status: AC

## 2024-07-07 MED ORDER — PHENAZOPYRIDINE HCL 100 MG PO TABS: 100.0000 mg | ORAL_TABLET | Freq: Three times a day (TID) | ORAL | 0 refills | Status: AC | PRN

## 2024-07-07 NOTE — ED Provider Notes (Addendum)
 " [facility]    CSN: [id number] Arrival date & time: 07/07/24  1243      History   Chief Complaint Chief Complaint  Patient presents with   Dysuria    HPI Cassandra Schultz is a 24 y.o. female.   HPI  Pt is here today with concerns for UTI. She reports that 3-4 days ago she had some burning with her first morning urination but did not have continued symptoms that day. Then she started to have more persistent dysuria and now her urine has a strong ammonia smell.    Past Medical History:  Diagnosis Date   ADHD    Allergy     Anxiety    Depression    reports ADHD    Eczema    POTS (postural orthostatic tachycardia syndrome) 10/17/2022   Raynaud's syndrome    Tremor of both hands     Patient Active Problem List   Diagnosis Date Noted   POTS (postural orthostatic tachycardia syndrome) 10/17/2022   Pollen-food allergy  08/25/2021   SVT (supraventricular tachycardia) 05/09/2021   Palpitations 04/17/2021   Seasonal and perennial allergic rhinitis 09/21/2020   Seasonal allergic conjunctivitis 09/21/2020   Intrinsic atopic dermatitis 09/21/2020   Numbness and tingling of both legs - comes and goes after activity and onset of pain 08/03/2019   Fatigue 05/27/2019   Low back pain 02/24/2019   Bilateral knee pain 02/24/2019   Bilateral hip pain 02/24/2019   Acute bilateral ankle pain 02/24/2019   Healthcare maintenance 12/23/2018   Generalized anxiety disorder 04/03/2018   Attention deficit hyperactivity disorder (ADHD), combined type, severe 04/03/2018   Mild recurrent major depression 04/03/2018   Developmental coordination disorder 04/03/2018   Allergy  with anaphylaxis due to food 02/02/2016   Allergic rhinitis due to pollen 02/02/2016    Past Surgical History:  Procedure Laterality Date   NO PAST SURGERIES     WISDOM TOOTH EXTRACTION      OB History   No obstetric history on file.      Home Medications    Prior to Admission medications   Medication Sig Start Date End Date Taking? Authorizing Provider  fluconazole  (DIFLUCAN ) 150 MG tablet Take 1 tablet (150 mg total) by mouth every three (3) days as needed. May repeat in 3 days if symptoms not resolved 07/07/24  Yes Bunnie Rehberg E, PA-C  nitrofurantoin , macrocrystal-monohydrate, (MACROBID ) 100 MG capsule Take 1 capsule (100 mg total) by mouth 2 (two) times daily. 07/07/24  Yes Verble Styron E, PA-C  phenazopyridine  (PYRIDIUM ) 100 MG tablet Take 1 tablet (100 mg total) by mouth 3 (three) times daily as needed. 07/07/24  Yes Detrich Rakestraw E, PA-C  amphetamine -dextroamphetamine  (ADDERALL) 20 MG tablet Take 2 tablets by mouth daily. 05/21/19   [provider]  Cholecalciferol (VITAMIN D3) 1.25 MG (50000 UT) CAPS Take 1 capsule by mouth every 7 (seven) days.    [provider]  DULoxetine  (CYMBALTA ) 30 MG capsule Take 30 mg by mouth daily.    [provider]  EPINEPHrine  (AUVI-Q ) 0.3 mg/0.3 mL IJ SOAJ injection Inject 0.3 mg into the muscle as needed for anaphylaxis. 09/24/23   Cheryl Reusing, FNP  levocetirizine (XYZAL ) 5 MG tablet Take 1 tablet (5 mg total) by mouth in the morning and at bedtime. 12/24/23   Iva Marty Saltness, MD  propranolol  (INDERAL ) 10 MG tablet Take 1 tablet (10 mg total) by mouth 2 (two) times daily. 04/08/23   Vannie Reche RAMAN, NP    Family History Family  History  Problem Relation Age of Onset   Allergic rhinitis Mother    Depression Mother    Arthritis Mother    Yvone' disease Mother    Heart attack Father    Allergic rhinitis Father    Neuropathy Father        peripheral    Allergic rhinitis Paternal Aunt    Alcohol abuse Maternal Grandmother    Depression Maternal Grandmother    Hypothyroidism Maternal Grandmother    Angina Maternal Grandmother    Alcohol abuse Maternal Grandfather    Other Paternal Grandfather        FSHD    Angioedema Neg Hx    Asthma Neg Hx    Eczema Neg Hx    Immunodeficiency Neg Hx    Urticaria Neg  Hx     Social History Social History[1]   Allergies   Other, Peanut-containing drug products, Acyclovir and related, Peanut oil, and Amoxicillin-pot clavulanate   Review of Systems Review of Systems  Constitutional:  Negative for chills and fever.  Gastrointestinal:  Negative for abdominal pain.  Genitourinary:  Positive for dysuria and frequency. Negative for difficulty urinating, flank pain, vaginal bleeding, vaginal discharge and vaginal pain.     Physical Exam Triage Vital Signs ED Triage Vitals  Encounter Vitals Group     BP 07/07/24 1251 105/65     Girls Systolic BP Percentile --      Girls Diastolic BP Percentile --      Boys Systolic BP Percentile --      Boys Diastolic BP Percentile --      Pulse Rate 07/07/24 1251 98     Resp 07/07/24 1251 18     Temp 07/07/24 1251 98.9 F (37.2 C)     Temp Source 07/07/24 1251 Oral     SpO2 07/07/24 1251 98 %     Weight 07/07/24 1251 159 lb (72.1 kg)     Height 07/07/24 1251 5' 9 (1.753 m)     Head Circumference --      Peak Flow --      Pain Score 07/07/24 1300 1     Pain Loc --      Pain Education --      Exclude from Growth Chart --    No data found.  Updated Vital Signs BP 105/65 (BP Location: Right Arm)   Pulse 98   Temp 98.9 F (37.2 C) (Oral)   Resp 18   Ht 5' 9 (1.753 m)   Wt 159 lb (72.1 kg)   LMP 06/20/2024 (Exact Date)   SpO2 98%   BMI 23.48 kg/m   Visual Acuity Right Eye Distance:   Left Eye Distance:   Bilateral Distance:    Right Eye Near:   Left Eye Near:    Bilateral Near:     Physical Exam Vitals reviewed.  Constitutional:      General: She is awake.     Appearance: Normal appearance. She is well-developed and well-groomed.  HENT:     Head: Normocephalic and atraumatic.  Eyes:     General: Lids are normal. Gaze aligned appropriately.     Extraocular Movements: Extraocular movements intact.     Conjunctiva/sclera: Conjunctivae normal.  Pulmonary:     Effort: Pulmonary effort  is normal.  Neurological:     Mental Status: She is alert and oriented to person, place, and time.  Psychiatric:        Attention and Perception: Attention and perception normal.  Mood and Affect: Mood and affect normal.        Speech: Speech normal.        Behavior: Behavior normal. Behavior is cooperative.      UC Treatments / Results  Labs (all labs ordered are listed, but only abnormal results are displayed) Labs Reviewed  POCT URINE DIPSTICK - Abnormal; Notable for the following components:      Result Value   Clarity, UA cloudy (*)    Nitrite, UA Positive (*)    Leukocytes, UA Small (1+) (*)    All other components within normal limits  URINE CULTURE    EKG   Radiology No results found.  Procedures Procedures (including critical care time)  Medications Ordered in UC Medications - No data to display  Initial Impression / Assessment and Plan / UC Course  I have reviewed the triage vital signs and the nursing notes.  Pertinent labs & imaging results that were available during my care of the patient were reviewed by me and considered in my medical decision making (see chart for details).      Final Clinical Impressions(s) / UC Diagnoses   Final diagnoses:  Urinary tract infection without hematuria, site unspecified  Antibiotic-induced yeast infection   Patient presents today with concerns for UTI.  She states dysuria as well as ammonia smell with urination.  Urine dip is notable for leukocytes, nitrites.  Will send sample for urine culture for definitive ID and susceptibility testing.  In the meantime we will start Macrobid  100 mg p.o. twice daily x 5 days.  Reviewed with patient that we will contact her with results should we need to change her management plan based on culture results.  Patient does request Diflucan  for antibiotic induced yeast infection.  Prescription sent to pharmacy.  ED and return precautions reviewed and provided in AVS.  Follow-up as  needed.   Discharge Instructions      Based on your symptoms and results of the urinalysis I believe you have a UTI I recommend the following:  I have sent in a script for Macrobid  100 mg to be taken by mouth twice per day for 5 days.  Please finish the entire course of the antibiotic even if you are feeling better before it is completed unless you develop an allergic reaction or are told by a medical provider to stop taking it. We have sent a sample of your urine off for a urine culture.  This will help us  determine what bacteria is causing your symptoms as well as the most appropriate antibiotic to treat it.  If we need to make any adjustments to your medication regimen and new medication will be sent to the pharmacy on file and you will be updated via phone call in MyChart. Stay well hydrated (at least 75 oz of water per day) and avoid holding your urine for prolonged periods of time. If you have any of the following please return to urgent care or go to the emergency room: Persistent symptoms, fever, trouble urinating or inability to urinate, confusion, flank pain.       ED Prescriptions     Medication Sig Dispense Auth. Provider   nitrofurantoin , macrocrystal-monohydrate, (MACROBID ) 100 MG capsule Take 1 capsule (100 mg total) by mouth 2 (two) times daily. 10 capsule Kaycie Pegues E, PA-C   phenazopyridine  (PYRIDIUM ) 100 MG tablet Take 1 tablet (100 mg total) by mouth 3 (three) times daily as needed. 10 tablet Yves Fodor E, PA-C   fluconazole  (  DIFLUCAN ) 150 MG tablet Take 1 tablet (150 mg total) by mouth every three (3) days as needed. May repeat in 3 days if symptoms not resolved 2 tablet Shekelia Boutin E, PA-C      PDMP not reviewed this encounter.    Allysa Governale, Rocky FORBES RIGGERS 07/07/24 1631     [1]  Social History Tobacco Use   Smoking status: Never   Smokeless tobacco: Never  Vaping Use   Vaping status: Never Used  Substance Use Topics   Alcohol use: Yes    Alcohol/week:  30.0 standard drinks of alcohol    Types: 30 Shots of liquor per week   Drug use: Never     Quaniya Damas, Rocky FORBES, PA-C 07/07/24 1631  "

## 2024-07-07 NOTE — ED Triage Notes (Signed)
 Pt presents with a chief complaint of dysuria. Symptoms began approximately three days ago. Currently rates overall pain a 1/10. Urine has a strong ammonia odor to it per pt description. No medications taken PTA for sxs/pain reported.

## 2024-07-07 NOTE — Discharge Instructions (Addendum)
 Based on your symptoms and results of the urinalysis I believe you have a UTI I recommend the following:  I have sent in a script for Macrobid 100 mg to be taken by mouth twice per day for 5 days Please finish the entire course of the antibiotic even if you are feeling better before it is completed unless you develop an allergic reaction or are told by a medical provider to stop taking it. We have sent a sample of your urine off for a urine culture.  This will help us  determine what bacteria is causing your symptoms as well as the most appropriate antibiotic to treat it.  If we need to make any adjustments to your medication regimen and new medication will be sent to the pharmacy on file and you will be updated via phone call in MyChart. Stay well hydrated (at least 75 oz of water per day) and avoid holding your urine for prolonged periods of time. If you have any of the following please return to urgent care or go to the emergency room: Persistent symptoms, fever, trouble urinating or inability to urinate, confusion, flank pain.

## 2024-07-09 ENCOUNTER — Ambulatory Visit (HOSPITAL_COMMUNITY): Payer: Self-pay

## 2024-07-09 LAB — URINE CULTURE: Culture: >=100000 — AB

## 2024-07-22 ENCOUNTER — Other Ambulatory Visit (HOSPITAL_BASED_OUTPATIENT_CLINIC_OR_DEPARTMENT_OTHER): Payer: Self-pay | Admitting: Family
